# Patient Record
Sex: Female | Born: 1935 | Race: White | Hispanic: No | Smoking: Never smoker
Health system: Southern US, Community
[De-identification: ages and names within clinical notes are randomized; demographics above are authoritative.]

## PROBLEM LIST (undated history)

## (undated) DIAGNOSIS — E7800 Pure hypercholesterolemia, unspecified: Secondary | ICD-10-CM

## (undated) DIAGNOSIS — K56609 Unspecified intestinal obstruction, unspecified as to partial versus complete obstruction: Secondary | ICD-10-CM

## (undated) DIAGNOSIS — F32A Depression, unspecified: Secondary | ICD-10-CM

## (undated) DIAGNOSIS — E039 Hypothyroidism, unspecified: Secondary | ICD-10-CM

## (undated) DIAGNOSIS — N189 Chronic kidney disease, unspecified: Secondary | ICD-10-CM

## (undated) DIAGNOSIS — R51 Headache: Secondary | ICD-10-CM

## (undated) DIAGNOSIS — M797 Fibromyalgia: Secondary | ICD-10-CM

## (undated) DIAGNOSIS — I10 Essential (primary) hypertension: Secondary | ICD-10-CM

## (undated) DIAGNOSIS — G2581 Restless legs syndrome: Secondary | ICD-10-CM

## (undated) DIAGNOSIS — R001 Bradycardia, unspecified: Secondary | ICD-10-CM

## (undated) DIAGNOSIS — N3010 Interstitial cystitis (chronic) without hematuria: Secondary | ICD-10-CM

## (undated) DIAGNOSIS — F329 Major depressive disorder, single episode, unspecified: Secondary | ICD-10-CM

## (undated) DIAGNOSIS — J309 Allergic rhinitis, unspecified: Secondary | ICD-10-CM

## (undated) DIAGNOSIS — F419 Anxiety disorder, unspecified: Secondary | ICD-10-CM

## (undated) DIAGNOSIS — I639 Cerebral infarction, unspecified: Secondary | ICD-10-CM

## (undated) DIAGNOSIS — R1900 Intra-abdominal and pelvic swelling, mass and lump, unspecified site: Secondary | ICD-10-CM

## (undated) HISTORY — PX: APPENDECTOMY: SHX54

## (undated) HISTORY — PX: ABDOMINAL HYSTERECTOMY: SHX81

## (undated) HISTORY — PX: CYSTOSCOPY WITH URETHRAL DILATATION: SHX5125

## (undated) HISTORY — PX: CHOLECYSTECTOMY: SHX55

## (undated) HISTORY — PX: TONSILLECTOMY: SUR1361

---
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
## 1969-05-25 HISTORY — PX: SHOULDER SURGERY: SHX246

## 1969-05-25 HISTORY — PX: ABDOMINAL SURGERY: SHX537

## 1985-09-24 DIAGNOSIS — K56609 Unspecified intestinal obstruction, unspecified as to partial versus complete obstruction: Secondary | ICD-10-CM

## 1985-09-24 HISTORY — DX: Unspecified intestinal obstruction, unspecified as to partial versus complete obstruction: K56.609

## 1999-08-30 ENCOUNTER — Emergency Department (HOSPITAL_COMMUNITY): Admission: EM | Admit: 1999-08-30 | Discharge: 1999-08-30 | Payer: Self-pay | Admitting: Emergency Medicine

## 2002-06-24 DIAGNOSIS — I639 Cerebral infarction, unspecified: Secondary | ICD-10-CM

## 2002-06-24 HISTORY — DX: Cerebral infarction, unspecified: I63.9

## 2002-07-16 ENCOUNTER — Encounter: Payer: Self-pay | Admitting: Emergency Medicine

## 2002-07-16 ENCOUNTER — Inpatient Hospital Stay (HOSPITAL_COMMUNITY): Admission: EM | Admit: 2002-07-16 | Discharge: 2002-07-18 | Payer: Self-pay | Admitting: Emergency Medicine

## 2002-07-17 ENCOUNTER — Encounter: Payer: Self-pay | Admitting: Cardiology

## 2002-07-17 ENCOUNTER — Encounter: Payer: Self-pay | Admitting: *Deleted

## 2005-01-22 ENCOUNTER — Ambulatory Visit (HOSPITAL_COMMUNITY): Admission: RE | Admit: 2005-01-22 | Discharge: 2005-01-22 | Payer: Self-pay | Admitting: Urology

## 2005-07-25 ENCOUNTER — Encounter (INDEPENDENT_AMBULATORY_CARE_PROVIDER_SITE_OTHER): Payer: Self-pay | Admitting: Specialist

## 2005-07-25 ENCOUNTER — Ambulatory Visit (HOSPITAL_COMMUNITY): Admission: RE | Admit: 2005-07-25 | Discharge: 2005-07-25 | Payer: Self-pay | Admitting: Urology

## 2005-07-25 ENCOUNTER — Ambulatory Visit (HOSPITAL_BASED_OUTPATIENT_CLINIC_OR_DEPARTMENT_OTHER): Admission: RE | Admit: 2005-07-25 | Discharge: 2005-07-25 | Payer: Self-pay | Admitting: Urology

## 2006-03-06 ENCOUNTER — Ambulatory Visit (HOSPITAL_BASED_OUTPATIENT_CLINIC_OR_DEPARTMENT_OTHER): Admission: RE | Admit: 2006-03-06 | Discharge: 2006-03-06 | Payer: Self-pay | Admitting: Urology

## 2006-03-26 ENCOUNTER — Encounter: Admission: RE | Admit: 2006-03-26 | Discharge: 2006-03-26 | Payer: Self-pay | Admitting: Geriatric Medicine

## 2007-02-14 ENCOUNTER — Emergency Department (HOSPITAL_COMMUNITY): Admission: EM | Admit: 2007-02-14 | Discharge: 2007-02-14 | Payer: Self-pay | Admitting: Emergency Medicine

## 2007-06-17 ENCOUNTER — Encounter: Admission: RE | Admit: 2007-06-17 | Discharge: 2007-06-17 | Payer: Self-pay | Admitting: Geriatric Medicine

## 2008-01-27 ENCOUNTER — Encounter: Admission: RE | Admit: 2008-01-27 | Discharge: 2008-01-27 | Payer: Self-pay | Admitting: Geriatric Medicine

## 2008-04-04 ENCOUNTER — Ambulatory Visit: Payer: Self-pay | Admitting: Pulmonary Disease

## 2008-04-04 ENCOUNTER — Inpatient Hospital Stay (HOSPITAL_COMMUNITY): Admission: EM | Admit: 2008-04-04 | Discharge: 2008-04-09 | Payer: Self-pay | Admitting: Emergency Medicine

## 2008-04-05 ENCOUNTER — Encounter (INDEPENDENT_AMBULATORY_CARE_PROVIDER_SITE_OTHER): Payer: Self-pay | Admitting: Pulmonary Disease

## 2008-04-05 ENCOUNTER — Ambulatory Visit: Payer: Self-pay | Admitting: Vascular Surgery

## 2011-02-06 NOTE — Consult Note (Signed)
Leslie [name], Leslie [name]:  [id number]   MEDICAL RECORD NO.:  [id number]          PATIENT TYPE:  INP   LOCATION:  2007                         FACILITY:  [facility]   PHYSICIAN:  Lyn Records, M.D.   DATE OF BIRTH:  02-23-1922   DATE OF CONSULTATION:  04/08/2008  DATE OF DISCHARGE:                                 CONSULTATION   REASON FOR CONSULTATION:  Bradycardia.   CONCLUSIONS:  1. Bradycardia, possibly symptomatic.  Etiology currently could be      multifactorial.  Rule out sinus node dysfunction.  Rule out      medication effect (recent doses of clonidine, Cardizem, and      labetalol).  Rule out neurocardiogenic mechanisms.  2. Labile blood pressure elevation rule out renal artery stenosis,      rule out pheochromocytoma, rule out other.  3. History of anxiety and depression.  4. History of parkinsonism.  5. Interstitial cystitis.   RECOMMENDATIONS:  1. Withhold medications that can block the AV node or sinus node.  2. Rule out hypothyroidism (already done).  3. Ambulate to assess chronotropic response to physical activity.  4. No definite clinical indication for pacemaker therapy yet until we      are certain that medications are watched down on the patient's      system.  5. We will continue to follow with you.   COMMENTS:  The patient is 77 years of age and was admitted to the  hospital because of severe headache and elevated blood pressure.  After  admission to the hospital, it was noted that she tended to have  bradycardia.  Her initial heart rates were in the 50s.  Her home  medications included Diltiazem.  Because of elevated blood pressures,  she received labetalol and also several doses of clonidine.  Subsequently, the heart rates at times have noted to be in the upper  30s.  Most of these have occurred when the patient has been asleep.  She  had some very slow heart rates this morning.  She states that both this  morning and yesterday she had  nausea and sweating.  The nausea and  sweating this morning occurred between 8 and 9 o'clock before she ate  breakfast.  She did not vomit.  There has been no chest discomfort.  She  denies orthopnea.  She was having exertional dyspnea on admission.  She  has no history of coronary artery disease.   MEDICATIONS ON ADMISSION:  1. Lorazepam 1 mg p.o. b.i.d. p.r.n.  2. Trandolapril 1 mg p.o. daily.  3. Diltiazem CD 260 mg per day.  4. Levothyroxine 50 mcg per day.  5. Seroquel 50 mg daily.  6. Singulair 10 mg per day.  7. Simvastatin 10 mg per day.  8. Nexium 40 mg per day.  9. Elmiron 100 mg per day   ALLERGIES:  CODEINE, MORPHINE, PENICILLIN, ANTIHISTAMINES,  DECONGESTANTS, ALLEGRA, and TAGAMET.   SOCIAL HISTORY:  Does not smoke, does not drink.   FAMILY HISTORY:  Noncontributory.  Specifically, there is no history of  pacemaker therapy in parents  or siblings.   PHYSICAL EXAMINATION:  The patient is lying comfortably and flat in bed.  Her blood pressure is 140/73, drops to 87/86 with standing heart rate is  55.  HEENT exam unremarkable.  Neck exam reveals no bruits.  Chest is  clear.  Cardiac exam reveals a heart rate of around 50, S4 gallop.  Abdomen is soft.  Extremities no edema.  Neuro exam is unremarkable.   Laboratory data reveals a creatinine of 1.54, hemoglobin of 12.1.  TSH  1.766.  Echo reveals mild LVH, small LV cavity size, increased left  atrial size, EF of 60%.   DISCUSSION:  The patient has relatively nonspecific symptoms, admitting  significant blood pressure elevation, headache, and exertional dyspnea.  Since admission, she has been noted to be bradycardic.  My suspicion is  that the bradycardia is due to medications on top of some mild sinus  node dysfunction.  I do not yet see any indication for permanent  pacemaker therapy.  I would recommend withholding medications that  control the heart rate and monitoring the patient for period longer  before making a  definitive decision about pacemaker therapy.  We will  continue to follow with you.      Lyn Records, M.D.  Electronically Signed     HWS/MEDQ  D:  04/08/2008  T:  04/08/2008  Job:  [id number]   cc:   Hal T. Stoneking, M.D.

## 2011-02-06 NOTE — H&P (Signed)
[name], [name]:  [id number]   MEDICAL RECORD NO.:  [id number]          PATIENT TYPE:  INP   LOCATION:  1826                         FACILITY:  [facility]   PHYSICIAN:  Vanetta Mulders, MD         DATE OF BIRTH:  Dec 16, 1921   DATE OF ADMISSION:  04/04/2008  DATE OF DISCHARGE:                              HISTORY & PHYSICAL   CHIEF COMPLAINT:  The patient comes in for shortness of breath.   HISTORY OF PRESENT ILLNESS:  Leslie [name] is an 77 year old lady with past  medical history significant for hypertension, insomnia, restless leg  syndrome, depression, possible bipolar disorder, GERD, and interstitial  cystitis.  Recently, she has been evaluated by her primary care  physician for new onset hypertension.  Dr. Pete Glatter has discussed with  the family the possibility that the patient has secondary hypertension  and he was planning to do workup for renal artery stenosis.  He recently  escalated the doses of Diovan in the hope that the blood pressure is  going to be under better control.  For the past several  weeks, the  patient experienced on and off hypertension episodes where her blood  pressure was as high as 220/80.   Two days prior to admission, the patient started having shortness of  breath, sudden in onset that got worse over the next few days to the  point where she presented to the emergency room on the day of admission.  She denies at the interview, chest pain and she has not had this  recently.  She states that she has been having paroxysmal nocturnal  dyspnea and dyspnea with exertion, but again she denied chest pains.  She admitted having headaches, frontal significant, starting  approximately 2 days prior to admission.  The most intense headache the  patient had was an 8/10 and it was morning prior to the admission day.  The patient denies nausea, vomiting, lower extremity edema, calf pain,  or tenderness.  She states that she had been lightheaded,  but she denies  vertigo or neurologic problems.  She states that she has been feeling  week allover, but denies focal symptoms.   The patient also complaints of insomnia for which she has been taking  medications for a long time.  There is a history in the chart of  Parkinson disease.  The patient states that she was told that she does  not have these and she is on ReQuip for restless leg syndrome.   PAST MEDICAL HISTORY:  1. Hypertension.  2. Insomnia and restless leg syndrome.  3. History of depression and possible bipolar disorder per patient's      father.  4. GERD.  5. Anxiety.  6. Interstitial cystitis.  7. Chronic kidney disease per patient secondary to chronic      interstitial cystitis.  8. History of hysterectomy.  9. Multiple allergies.   SOCIAL HISTORY:  The patient has never smoked.  She denies drinking or  using illicit drugs.   FAMILY HISTORY:  Noncontributory.  The patient has an adopted daughter.  She  lives with her daughter.  She seems to be very compliant with her  medication.   REVIEW OF SYSTEMS:  Negative for chest pain, nausea or vomiting,  abdominal pain, diarrhea or constipation, urinary complaints, focal  weakness, rash, fever, chills, cough.  It is positive for facial  numbness that occurred the day prior to admission.  The patient denies  jaw claudication, denies arm pain or numbness, no back pain.  Rest  review of system per history of present illness.   PHYSICAL EXAM:  VITAL SIGNS:  Blood pressure at admission 181/69, heart  rate 60, respiratory rate 24, temperature 98.6, oxygen saturation 93% on  room air.  The patient received 10 mg of labetalol and blood pressure  dropped to 122/95, heart rate to 55, and oxygen saturation to 98.  GENERAL:  The patient is in no acute distress.  EYES:  PERRLA.  Extraocular movement intact.  OROPHARYNX:  Clear.  Good ear canal.  Normal lucency of tympanic  membrane.  CARDIOVASCULAR:  Bradycardia regular.  No  murmurs, no rubs.  RESPIRATORY:  Good air movement.  No adventitious breath sounds.  No  dullness to percussion.  ABDOMEN:  Bowel sounds positive, soft, nontender, and nondistended.  EXTREMITIES:  No edema.  NEUROLOGIC:  2 through 12 grossly intact.  Good sensation and good  reflexes.  PSYCHOLOGIC:  The patient is alert and oriented x3.   LABORATORY DATA AND IMAGES:  A UA showed 7-10 white blood cells and rare  squamous epithelial cells, negative nitrite and trace leukocyte  esterase.  BMP 66, sodium 137, potassium 5, chloride 101, bicarb 26,  glucose 107, BUN 20, creatinine 133 with GFR 46, calcium 9.  Point of  care markers negative with troponin less than 0.05 and myoglobin of 199,  white count 10.8, hemoglobin 12.5, and platelets 264.  CT head without  contrast done on 04/04/2008 showed no acute intracranial abnormalities,  extensive preventricular and subcortical white matter hyperattenuation  bilaterally.  Very nonspecific likely reflux sequelae of chronic  microvascular ischemia and chest x-ray done on April 04, 2008, showed  cardiomegaly with mild pulmonary vascular congestion and possibly tiny  left pleural effusion, elevation of the right hemidiaphragm, which is  persistent and present on previous chest x-ray.  Previous CT abdomen in  September 2008 show a regular dimentionated soft tissue mass seen in the  mesentery of the right lower quadrant at 2.8 x 3.2 cm, most consistent  with carcinoid tumor or lymphoma.   ASSESSMENT AND PLAN:  1. Shortness of breath.  Differential diagnoses at this time is very      broad.  The first set of cardiac enzymes point of care are      negative.  The EKG shows nonspecific ST changes with first-degree      AV block, which was present on the previous EKG on May 2008.  There      is no D-dimer in the computer.  The BMP is within normal limits and      patient does not have any signs on physical exam or chest x-ray of      pulmonary edema.  It  is unclear to me why the patient is short of      breath, it could be an anginal equivalent, it could be related to      something else like pulmonary embolism.  The patient does not have      any risk factors for pulmonary embolism.  No previous history of  clotting disorder.  No previous clots.  No history of traveling or      longtime immobilization.  No previous recent surgery or      hospitalization.  At this time, though, given the fact that the      shortness of breath was very sudden even though it could be      debilitating to the hypertensive urgency, I think it is reasonable      to check VQ scan and lower extremity Doppler's.  The shortness of      breath could be somewhat related to the elevated right diaphragm,      but this is an old problem previously seen on chest x-ray.  2. Hypertensive urgency.  The patient will be admitted to stepdown.      When seen in the emergency room, blood pressure was lower than      previously at 144/69.  At these time, I am going to hold the blood      pressure medications and consider starting nitroglycerine drip if      blood pressure is climbing up.  I will restart the PO blood      pressure medications in the a.m. holding the Diltiazem for      bradycardia and first-degree block.  The patient will need further      workup for secondary hypertension with possibly a renal Dopplex or      an MRI, which ever can be done here.  3. Headache most likely due to hypertension.  CT head was negative for      acute abnormalities.  The patient did not have any neurologic      abnormalities and the headache was improving with blood pressure      medications and blood pressure control.  We will continue to      monitor her problem.  4. Sinus bradycardia.  We will avoid beta-blockers and calcium channel      blockers.  We will consider starting nitroglycerine drip if the      blood pressure will significantly increase without medications.  5. DVT  prophylaxis with Lovenox.  6. Soft tissue mass in the right lower quadrant mesentery, and in the      past and in 2008, worrisome for carcinoid, it is questionable if      there were hypertensive urgency and it would be the cause for      hypertensive episodes.  7. Gastroesophageal reflux disease.  We will continue Nexium.  8. Ethics.  We discussed the code status and the patient states that      she does not want any aggressive care or therapy in case of      deterioration of her health status.  In case of cardiorespiratory      arrest, she does not want to receive CPR shock or intubation.  The      patient's brother was at the bedside for the discussion and she is      a power of attorney, but the patient also has a living will.      Vanetta Mulders, MD  Electronically Signed     DA/MEDQ  D:  04/05/2008  T:  04/05/2008  Job:  [id number]

## 2011-02-09 NOTE — Discharge Summary (Signed)
NAMETAMBER, BURTCH                         ACCOUNT NO.:  [id number]   MEDICAL RECORD NO.:  [id number]                   PATIENT TYPE:  INP   LOCATION:  3730                                 FACILITY:  [facility]   PHYSICIAN:  Candy Sledge, M.D.            DATE OF BIRTH:  1922-07-12   DATE OF ADMISSION:  07/16/2002  DATE OF DISCHARGE:  07/18/2002                                 DISCHARGE SUMMARY   HISTORY OF PRESENT ILLNESS:  For complete details of the chief complaint,  history of present illness, past medical history, and physical examination,  please refer to Dr. Porfirio Mylar Dohmeier's admission history and physical.  Briefly, the patient is a 77 year old, right-handed female who presented on  the afternoon of admission with the acute development of a expressive  aphasia, dysarthria, left-sided weakness, and tingling.  The tingling  sensation is primarily involved in her left arm and leg.  She had difficulty  walking at the onset of the symptomatology.  The initial examination in the  emergency room showed elevated blood pressure of 205/88.  She appeared to  have a lower left facial droop.  The left nasolabial fold was decreased.  The motor examination revealed good strength in the upper and lower  extremities with a very slight pronator drift on the left.  She had  bilateral dysmetria on finger-nose-finger testing.  The deep tendon reflexes  were generally diminished.  Sensation was basically intact bilaterally.  The  patient's deficits resolved after about two hours and the patient was  admitted for evaluation of probable right brain transient ischemic attacks.   LABORATORY DATA:  The admission CBC and differential showed a white cell  count of 10,300, hemoglobin 13.4, hematocrit 40.8, and platelet count  332,000.  There were 52% neutrophils, 33% lymphocytes, and 6% monocytes.  The PT and PTT were 12.9 and 32 seconds, respectively.  The comprehensive  metabolic panel showed all  indices to be within normal limits.  The fasting  lipid profile showed cholesterol 247 with an HDL of 35 and an LDL of 166.  The urinalysis is pending at this time.   The MRI of the brain showed a small acute right parietal nonhemorrhagic  infarct.  There were mild intercranial atherosclerotic changes on MRA and an  apparent 40% focal stenosis of the proximal right internal carotid artery.  Carotid Doppler studies showed no evidence of ICA stenosis.  Vertebral  artery flow was antegrade bilaterally.  A 2-D echocardiogram showed left  ventricular systolic function to be normal.  The ejection fraction was  estimated at 55-65%.  There were no left ventricular regional wall motion  abnormalities.  There was mild mitral annular calcification and mild mitral  valvular regurgitation.  The left atrium is mildly dilated.   HOSPITAL COURSE:  The patient was admitted to the telemetry unit.  No  arrhythmias were noted during this brief hospitalization.  She underwent the  above studies without problems.  Blood pressures were initially elevated in  the range of 180-200 systolic.  The patient was started on atenolol and  HCTZ.  The blood pressure on the day of discharge was 112/62.  The patient  remained essentially asymptomatic.  She was up ambulating without problems.  The neurologic examination at discharge was nonfocal.   The patient does have a presumed history of very mild Parkinson's disease  well managed on low-dose Requip.  There is no evidence of tremor, rigidity,  or bradykinesia on her examination.   DISPOSITION:  On July 18, 2002, she was felt to have arrived at maximum  hospital benefit and was discharged to home.   FINAL DIAGNOSES:  1. Right parietal nonhemorrhagic acute infarction.  2. Transient hypertension, controlled.  3. History of mild Parkinson's disease.  4. Chronic interstitial cystitis and nocturia.   DISCHARGE MEDICATIONS:  1. Requip 1 mg p.o. b.i.d.  2. Urised 2  mg two tablets three times a day.  3. Synthroid at home dose.  4. Fluoxetine at home dose.  5. Elmiron at home dose.  6. Ativan 1 mg at bedtime.  7. Restoril at home dose.  8. Allegra twice per day.  9. Motilium at home dose.  10.      Plavix 75 mg one per day.  The patient notes an allergy to aspirin,     which causes a rash.  11.      HCTZ 25 mg p.o. q.d.   FOLLOW-UP:  The patient was discharged to home with instructions to follow  up with Santina Evans A. Orlin Hilding, M.D., or Demetrio Lapping, P.A., in four  weeks.   CONDITION ON DISCHARGE:  Stable.   PROGNOSIS:  Good.                                               Candy Sledge, M.D.    JJS/MEDQ  D:  07/18/2002  T:  07/20/2002  Job:  [id number]   cc:   Santina Evans A. Orlin Hilding, M.D.  [address]  [place]  [place] [address]  Fax: [phone]

## 2011-02-09 NOTE — Op Note (Signed)
[name], PEYSER NO.:  [id number]   MEDICAL RECORD NO.:  [id number]          PATIENT TYPE:  AMB   LOCATION:  DAY                          FACILITY:  [facility]   PHYSICIAN:  Valetta Fuller, M.D.  DATE OF BIRTH:  Jul 22, 1922   DATE OF PROCEDURE:  01/22/2005  DATE OF DISCHARGE:                                 OPERATIVE REPORT   PREOPERATIVE DIAGNOSIS:  Interstitial cystitis.   POSTOPERATIVE DIAGNOSIS:  Interstitial cystitis.   PROCEDURE PERFORMED:  Urethral dilation, cystoscopy, with hydraulic  overdistention of the bladder and instillation of Clorpactin and Marcaine.   SURGEON:  Valetta Fuller, M.D.   ANESTHESIA:  General.   INDICATIONS:  Ms. Leslie [name] is an 77 year old female.  She has a longstanding  history of chronic pelvic pain with severe bladder overactivity.  She has  problems with chronic frequency, dysuria, and pelvic discomfort.  She has  also had recurrent bacterial cystitis in the past.  Approximately eight  years ago, the patient underwent cystoscopy with hydraulic overdistention of  the bladder which revealed a reduced bladder capacity and severe glomerular  hemorrhaging, consistent with interstitial cystitis.  The procedure markedly  helped her situation.  She has been on chronic maintenance with Urised and  Elmiron and has done really quite well over the last six to seven years.  Recently, she has had a flare-up of her pelvic pain, discomfort, and  dysuria.  Culture showed no evidence of infection, and we felt that she had  a significant flare-up of her interstitial cystitis.  She requested a repeat  procedure.   TECHNIQUE AND FINDINGS:  The patient was brought to the operating room,  where she had successful induction of general anesthesia.  She was placed in  the lithotomy position and prepped and draped in the usual manner.  She was  found to have severe atrophic vaginitis.  Urethral dilation was performed to  30 [place] without difficulty.   She underwent hydraulic overdistention of the  bladder at 80 cmH2O pressure for five minutes.  Capacity was reduced at  around 700-750 cc.  She had diffuse 4+ glomerular hemorrhaging and grossly  bloody effluent after the hydraulic overdistention.  She underwent a repeat  distention for 2 minutes.  Her bladder was then drained.  She had Clorpactin  instilled, a couple hundred cc at a time under gravity drainage.  Her  bladder was then emptied, and some Marcaine was applied.  A B & O  suppository was also administered.  The patient was brought to the recovery  room in stable condition.      DSG/MEDQ  D:  01/22/2005  T:  01/22/2005  Job:  [id number]

## 2011-02-09 NOTE — Op Note (Signed)
Leslie [name], TUFT NO.:  [id number]   MEDICAL RECORD NO.:  [id number]          PATIENT TYPE:  AMB   LOCATION:  [facility]                         FACILITY:  [facility]   PHYSICIAN:  Valetta Fuller, M.D.  DATE OF BIRTH:  02-May-1922   DATE OF PROCEDURE:  07/25/2005  DATE OF DISCHARGE:                                 OPERATIVE REPORT   PREOPERATIVE DIAGNOSES:  1.  Chronic interstitial cystitis.  2.  Chronic bacterial cystitis.  3.  Chronic pelvic pain.   POSTOPERATIVE DIAGNOSES:  1.  Chronic interstitial cystitis.  2.  Chronic bacterial cystitis.  3.  Chronic pelvic pain.   PROCEDURE:  Cystoscopy with bladder biopsy x3 and fulguration and  installation of Pyridium and Marcaine.   SURGEON:  Valetta Fuller, M.D.   ANESTHESIA:  General.   INDICATIONS:  Ms. Leslie [name] is an 77 year old female. She has had a complex  urologic history. She has clearly had longstanding chronic pelvic pain with  severe irritative voiding symptoms. We initially assessed her maybe 7 to 9  years ago at which point we felt she had interstitial cystitis. She was  relatively well managed on Uracid and Elmiron. Approximately 6 months ago  she began having increasing symptoms. In May of this year, we did a repeat  hydraulic over distension of her bladder which showed reduced capacity and  really severe glomerular hemorrhaging. She did get some improvement but it  was not terribly durable. The patient has also had what is felt to be  chronic recurrent bacterial cystitis. She has had several recent urinalyses  which have shown too numerous to count white blood cells as well as bacteria  but interestingly the cultures have been negative. Because of this ongoing  inflammatory picture, we felt it would probably be prudent to go in and take  some biopsies to be sure we are not dealing with other pathology. She  presents now for reassessment of her bladder.   TECHNIQUE AND FINDINGS:  The patient was  brought to the operating room where  she had successful induction of general anesthesia. She was placed in  lithotomy position, prepped and draped in the usual manner. The patient's  initial cystoscopy showed just some very significant diffuse chronic  erythema and what appeared to be mucosal edema. The edema was especially  pronounced close to the right ureteral orifice. She had several areas where  there did seem to be spontaneous bleeding from friable vessels and fairly  abnormal looking mucosa. I went ahead and did two cold cup biopsies of the  right hemitrigone where there was maximum inflammation and edema. We also  took an additional biopsy too of the right lateral wall and another area  where there was considerable mucosal abnormality. These areas were then  copiously fulgurated. Numerous others areas her bladder seemed to have  spontaneous bleeding and some of these other areas were fulgurated for  hemostasis as well. The picture overall in her bladder was quite nonspecific  but was consistent with a severe chronic inflammatory picture. I did not see  anything to definitively look like  transitional cell carcinoma but certainly  an infiltrative inflammatory type of malignancy and/or  carcinoma in situ cannot be ruled out and hence the biopsies were indeed  done. At the completion of the procedure, I instilled some Marcaine and  Pyridium in her bladder to help with postop analgesia. She was brought to  the recovery room in stable condition.           ______________________________  Valetta Fuller, M.D.  Electronically Signed     DSG/MEDQ  D:  07/25/2005  T:  07/25/2005  Job:  [id number]

## 2011-02-09 NOTE — Discharge Summary (Signed)
[name], KIMMET NO.:  [id number]   MEDICAL RECORD NO.:  [id number]          PATIENT TYPE:  INP   LOCATION:  2007                         FACILITY:  [facility]   PHYSICIAN:  Corinna L. Lendell [name], [name]:  04-11-1922   DATE OF ADMISSION:  04/04/2008  DATE OF DISCHARGE:  04/09/2008                               DISCHARGE SUMMARY   DISCHARGE DIAGNOSES:  1. Malignant hypertension.  2. Episodes of dyspnea and dizziness.  3. Abdominal mass, previously followed with serial CAT scans and      evaluated by surgery, felt to be benign.  4. History of asthma.  5. Bradycardia.  6. Renal insufficiency.  7. History of anxiety and depression.  8. Hypothyroidism.  9. History of interstitial cystitis.  10.History of depressive disorder.  11.Restless leg syndrome.  12.Gastroesophageal reflux disease.   DISCHARGE MEDICATIONS:  Continue.  She was told to stop diltiazem, stop  Diovan HCT, instead start Diovan 320 mg a day, Norvasc 5 mg a day,  continue lorazepam 1 mg p.o. b.i.d. as needed, ropinirole 1 mg p.o.  b.i.d., Synthroid 50 mcg a day, Seroquel 50 mg nightly, Singulair 10 mg  nightly, simvastatin 10 mg nightly, Nexium 40 mg a day, and iron t.i.d.   CONDITION:  Stable.   Follow up with Dr. Pete Glatter, at which time, urine, metanephrines, and 5-  HIAA should be checked, also blood pressure and heart rate.  Follow up  with Dr. Mendel Ryder on April 28, 2008, at 10:30 a.m.  A Holter monitor is  being arranged.   DIET:  Low salt.   ACTIVITY:  Ad lib.  Code status is limited.  Antiarrhythmic and  vasoactive drugs only.  No defibrillation, intubation, CPR, or  mechanical ventilation.   CONSULTATIONS:  Lyn Records, M.D.   PROCEDURES:  None.   LABS:  CBC unremarkable.  PT/PTT, D-dimer unremarkable.  Basic metabolic  panel on admission significant for a creatinine of 1.33.  Liver function  tests, magnesium normal.  Phosphorus 5.1.  Serial cardiac enzymes  normal.   TSH 1.766, free T4 0.91.  Urine catecholamines pending.  Urine  metanephrines and 5-HIAA pending.  Urinalysis showed negative nitrite,  trace leukocyte esterase, and 7-10 white cells.  Urine culture grew out  insignificant growth.   SPECIAL STUDIES/RADIOLOGY:  EKG showed sinus bradycardia with a rate in  the 50s.  Nonspecific changes.  MRA of the abdomen showed probable  cystic lesion involving the uncinate process of the pancreas.  Nondiagnostic study for renal arteries, markedly limited study due to  respiratory motion.  MRI, official report is pending.  MRA inadequate  for evaluation of renal arteries.  Chest x-ray showed cardiomegaly,  possible tiny left pleural effusion.  CT brain showed nothing acute,  extensive periventricular and subcortical white matter hypoattenuation.  Official MRI report is pending.  Echocardiogram showed normal ejection  fraction, mildly dilated left atrium.  Lower extremity Dopplers negative  for DVT.   HISTORY AND HOSPITAL COURSE:  [name] is an 77 year old white female  who presented with an episode of dyspnea and dizziness.  She was found  to have a blood pressure initially of 181/69 and a heart rate of 55.  She had a negative D-dimer and negative cardiac enzymes, but was found  to have periods of bradycardia.  She had been on Cardizem as an  outpatient and apparently her blood pressure had recently been very  difficult to control and her medications had been increased as an  outpatient.  Her Cardizem was stopped and she was started on Norvasc.  Her creatinine was increased on admission and increased slightly during  the hospitalization.  Her hydrochlorothiazide should be stopped, but she  may resume her ARB.  She has a history of an abdominal mass which was  felt to be benign, but given her symptoms, she was worked up for  carcinoid and the urine for 5-HIAA is pending, also urine was sent for  metanephrines and catecholamines.  An MRA of the renal  arteries was  ordered, but apparently was a poor study and renal arteries could not be  evaluated.  If need be, she could have a captopril nuclear medicine  study as an outpatient.  She continued to have bradycardia despite being  off Cardizem for several days.  She continued to have periods of  dizziness, but they did not necessarily correlate with bradycardia.  Cardiology was consulted and felt that she probably did have sinus node  dysfunction and would most likely need a pacemaker, but recommended  ambulatory monitoring to better correlate symptoms and rate.  The  patient's blood pressure by the time of discharge was 130/60.  She will  need followup with Dr. Pete Glatter for the labs and any further workup of  her hypertension.  Her dizziness and shortness of breath did seem to  improve off the Cardizem.   Also the official MRI report is pending.      Corinna L. Lendell [name], MD  Electronically Signed     CLS/MEDQ  D:  05/05/2008  T:  05/06/2008  Job:  [id number]   cc:   Hal T. Stoneking, M.D.  Lyn Records, M.D.

## 2011-02-09 NOTE — Op Note (Signed)
NAMEKRYSTIE, LEITER NO.:  [id number]   MEDICAL RECORD NO.:  [id number]          PATIENT TYPE:  AMB   LOCATION:  [facility]                         FACILITY:  [facility]   PHYSICIAN:  Valetta Fuller, M.D.  DATE OF BIRTH:  04-19-1922   DATE OF PROCEDURE:  03/06/2006  DATE OF DISCHARGE:                                 OPERATIVE REPORT   PREOPERATIVE DIAGNOSIS:  Interstitial cystitis.   POSTOPERATIVE DIAGNOSIS:  Interstitial cystitis.   PROCEDURE PERFORMED:  Urethral dilation, cystoscopy with hydraulic over  distension of the bladder, and installation of Marcaine   INDICATIONS:  Ms. Dehaas is an 77 year old female.  She is had very long-  standing problems with chronic pelvic pain and significant bladder  overactivity.  She has undergone cystoscopic assessment with hydraulic over  distension of the bladder in the past.  She has been noted to have a  markedly reduced bladder capacity.  She has had endoscopic evidence of  severe interstitial cystitis and has had some improvement with hydraulic  over distensions in the past.  The patient has also had ongoing chronic  pyuria.  Her cultures have tended to be negative, but she has had evidence  of severe chronic urinary tract inflammation.  Previous assessment had  revealed fairly normal capacity of 1000 to 1300 mL when she had her first  hydraulic over distension approximately ten years ago.  More recent  hydraulic over distension of her bladder was done back in November 2006.  At  that time, she also had biopsies because of the persistently abnormal urine  with negative cultures and these biopsies all revealed severe chronic and  acute inflammation including significant numbers of mass cells.  The  patient's symptoms have worsened recently and she requested repeat hydraulic  over distension of her bladder for reassessment and to see if we could  improve her symptom complex.   PROCEDURE TECHNIQUE AND FINDINGS:  The patient was  brought to the operating  room.  She had received preoperative gentamycin.  She was placed in the  lithotomy position and prepped and draped in the usual manner.  Urethral  dilation was performed to 26-French and there was no evidence of significant  stenosis.  Cystoscopy revealed fairly significant diffuse chronic  inflammatory changes of her bladder.  Hydraulic over distension of the  bladder was done to 80-100 cmH2O pressure for five minutes.  The capacity  was markedly reduced at only 450 mL.  The patient had diffuse severe  glomerular hemorrhaging with really a grossly bloody affluent of urine after  the hydraulic over distension.  She underwent repeat hydraulic over  distension for two additional minutes and then had installation of Marcaine  in her bladder.  She appeared to tolerate the procedure well and was brought  to the recovery room in stable condition.           ______________________________  Valetta Fuller, M.D.  Electronically Signed     DSG/MEDQ  D:  03/06/2006  T:  03/06/2006  Job:  [id number]

## 2011-02-09 NOTE — Consult Note (Signed)
NAME:  Leslie [name], Leslie [name]                         ACCOUNT NO.:  [id number]   MEDICAL RECORD NO.:  [id number]                   PATIENT TYPE:  EMS   LOCATION:  [facility]                                 FACILITY:  [facility]   PHYSICIAN:  Melvyn Novas, M.D.               DATE OF BIRTH:  1922-04-13   DATE OF CONSULTATION:  DATE OF DISCHARGE:                                   CONSULTATION   This 77 year old pleasant Caucasian right-handed female is a patient of Dr.  Marcelino Freestone.  She presented in the company of her grandson at about 3  p.m. today with an acute development of expressive aphasia, dysarthria, and  left-sided weakness, still able to communicate to him that she needs to go  to the hospital.  She states that she felt not well for 3 weeks prior due to  a viral illness that had given her chest congestion, frequent coughs,  inability to sleep, and she had just received a series of Rocephin shots  through her family doctor, Dr. Merril Abbe.  She had no other recent changes  in the medication list.   PAST MEDICAL HISTORY:  Positive for chronic interstitial cystitis and  nocturia, Parkinson's disease without dementia, bronchitis, anxiety,  insomnia from seasonal allergies, and chronic nausea.  The patient denies  any cardiac history, has never used aspirin or Plavix.   MEDICATIONS:  1. Requip 1 mg t.i.d.  2. Urocit 2 mg 2 tablets t.i.d.  3. Elmiron 5 mg t.i.d. p.o.  4. Restoril 5 mg q.h.s.  5. Ativan 1 mg q.h.s.  6. Allegra 1 pill in the morning.  7. Motilium.  The patient stated that this is a medication she receives     from [place] and would like to take at the bedside.  This is a non-     dopamine-antagonistic antinausea medication.   ALLERGIES:  SHE HAS NO KNOWN DRUG ALLERGIES EXCEPT A RASH TO ASPIRIN.   SOCIAL HISTORY:  The patient is a widow, lives alone, but has good contact  to her daughter and grandchildren who live close.  The patient is living in  assisted living and  is socially very active, participates.  She no longer  drives.  She does not smoke and does not use alcohol.   FAMILY HISTORY:  Negative for heart disease, coronary artery disease, or  hypertension, no diabetes, no strokes.   PHYSICAL EXAMINATION:  VITAL SIGNS:  The patient was admitted with 240/80  systolic blood pressure.  Currently, she is around 205/88.  Heart rate is in  the 80s.  Respiratory rate is 14.  Temperature is 97.  LUNGS:  Clear to auscultation.  ABDOMEN:  Soft, nontender, nondistended.  No flank pain.  EXTREMITIES:  Extremity pulses are all present and palpable.  There is no  peripheral edema, but a slight rash of 4-mm- to 6-mm-diameter spots strewn  over the extensor side of the extremities, tipped  by a small vesicle or  crust.  They appear slightly raised and pinkish.  She stated that these just  occurred over the last 3 days.  HEENT:  Mucous membranes are all intact without inflammation or infection  changes.  HEART:  No cardiac murmur.  MENTAL STATUS:  Alert and oriented x3.  Dysarthric but no longer aphasic as  initially described by her and her grandson.  CRANIAL NERVES:  Pupils are equal and reactive to light and accommodation.  Extraocular movements intact.  No gaze preference.  No papilledema.  No  facial tenderness.  There is lower left facial droop in the form of a  central 7th.  Frontal muscles are intact.  Eye closure is slightly  asymmetric.  The patient states she has an old ptosis.  Tongue and uvula are  midline.  The left nasolabial fold is decreased.  MOTOR EXAM:  Includes good strength left and right and equal pinch strength.  Muscle tone is equal and resistant, and a slight general stiffness, which  also hampered some rapid alternating movements.  Muscle mass is equal.  The  patient has a slight pronator drift on the left.  Finger-to-nose shows  bilateral dysmetria, but no ataxia and no tremor.  GAIT AND STANCE:  The patient can sit up with some  assistance due to truncal  stiffness but not truly weakness.  She can move her lower extremities when  sitting at the bedside and extend her knees.  She shows fairly equal  strength in knee extension, knee flexion, dorsiflexion, plantar flexion, and  toe movement.  She has no Babinski.  The DTRs are only traces.  There is no  clonus.  SENSORY:  Intact to pinprick, vibration, touch, and temperature.   ASSESSMENT:  The patient will be admitted for a motor stroke with  brachiofacial distribution.  This is perhaps a transient ischemic attack and  she could recover within 24 hours.  Due to her high blood pressure and her  low degree of impairment, she is not considered a t-PA candidate.  She might  have had suffered a vasospasm caused by the hypertensive episodes.   PLAN:  Admit for telemetry observation, hypertension treatment.  We will try  to keep the blood pressure around 150 systolic.  The patient can continue  her Parkinson's meds and all other medications.  We will add Plavix and  Toprol.  The patient is on a regular diet.  A TEE, carotid Doppler, and  speech, as well as OT consult and rehab consult, are established.                                               Melvyn Novas, M.D.    CD/MEDQ  D:  07/16/2002  T:  07/17/2002  Job:  [id number]

## 2011-04-17 ENCOUNTER — Other Ambulatory Visit: Payer: Self-pay | Admitting: Surgery

## 2011-06-21 LAB — BASIC METABOLIC PANEL
BUN: 20
CO2: 26
Calcium: 9
Creatinine, Ser: 1.33 — ABNORMAL HIGH
GFR calc non Af Amer: 38 — ABNORMAL LOW
Glucose, Bld: 107 — ABNORMAL HIGH
Sodium: 137

## 2011-06-21 LAB — URINALYSIS, ROUTINE W REFLEX MICROSCOPIC
Bilirubin Urine: NEGATIVE
Hgb urine dipstick: NEGATIVE
Ketones, ur: NEGATIVE
Nitrite: NEGATIVE
Specific Gravity, Urine: 1.012
Urobilinogen, UA: 0.2

## 2011-06-21 LAB — CATECHOLAMINES, FRACTIONATED, URINE, 24 HOUR
Dopamine 24 Hr Urine: 79 ug/24hr (ref <500)
Total urine volume: 550 mL

## 2011-06-21 LAB — URINE MICROSCOPIC-ADD ON

## 2011-06-21 LAB — COMPREHENSIVE METABOLIC PANEL
Alkaline Phosphatase: 66
BUN: 18
Chloride: 101
Creatinine, Ser: 1.32 — ABNORMAL HIGH
Glucose, Bld: 105 — ABNORMAL HIGH
Potassium: 4.3
Total Bilirubin: 0.7

## 2011-06-21 LAB — URINE CULTURE: Colony Count: 5000

## 2011-06-21 LAB — DIFFERENTIAL
Basophils Absolute: 0
Basophils Relative: 0
Lymphocytes Relative: 15
Monocytes Absolute: 0.9
Neutro Abs: 7.2
Neutrophils Relative %: 67

## 2011-06-21 LAB — POCT CARDIAC MARKERS
CKMB, poc: 3.9
Operator id: 196461
Troponin i, poc: <0.05

## 2011-06-21 LAB — CBC
MCHC: 33
Platelets: 264
RDW: 15.2

## 2011-06-21 LAB — T4, FREE: Free T4: 0.91

## 2011-06-21 LAB — MAGNESIUM: Magnesium: 2.2

## 2011-06-21 LAB — CARDIAC PANEL(CRET KIN+CKTOT+MB+TROPI)
Relative Index: INVALID
Troponin I: <0.01

## 2011-06-21 LAB — PROTIME-INR: Prothrombin Time: 13.8

## 2011-06-21 LAB — D-DIMER, QUANTITATIVE: D-Dimer, Quant: <0.22

## 2011-06-21 LAB — CK TOTAL AND CKMB (NOT AT ARMC): Total CK: 83

## 2011-06-22 LAB — CBC
Hemoglobin: 12.1
MCHC: 33.3
MCV: 87.7
RBC: 4.15

## 2011-06-22 LAB — BASIC METABOLIC PANEL
CO2: 28
Chloride: 98
GFR calc Af Amer: 39 — ABNORMAL LOW
Sodium: 136

## 2011-07-05 ENCOUNTER — Emergency Department (HOSPITAL_COMMUNITY): Payer: Medicare Other

## 2011-07-05 ENCOUNTER — Emergency Department (HOSPITAL_COMMUNITY)
Admission: EM | Admit: 2011-07-05 | Discharge: 2011-07-05 | Disposition: A | Discharge status: Home / Self Care | Payer: Medicare Other | Attending: Emergency Medicine | Admitting: Emergency Medicine

## 2011-07-05 DIAGNOSIS — G8929 Other chronic pain: Secondary | ICD-10-CM | POA: Insufficient documentation

## 2011-07-05 DIAGNOSIS — E789 Disorder of lipoprotein metabolism, unspecified: Secondary | ICD-10-CM | POA: Insufficient documentation

## 2011-07-05 DIAGNOSIS — N390 Urinary tract infection, site not specified: Secondary | ICD-10-CM | POA: Insufficient documentation

## 2011-07-05 DIAGNOSIS — Z79899 Other long term (current) drug therapy: Secondary | ICD-10-CM | POA: Insufficient documentation

## 2011-07-05 DIAGNOSIS — R110 Nausea: Secondary | ICD-10-CM | POA: Insufficient documentation

## 2011-07-05 DIAGNOSIS — E875 Hyperkalemia: Secondary | ICD-10-CM | POA: Insufficient documentation

## 2011-07-05 DIAGNOSIS — R197 Diarrhea, unspecified: Secondary | ICD-10-CM | POA: Insufficient documentation

## 2011-07-05 DIAGNOSIS — R109 Unspecified abdominal pain: Secondary | ICD-10-CM | POA: Insufficient documentation

## 2011-07-05 DIAGNOSIS — I10 Essential (primary) hypertension: Secondary | ICD-10-CM | POA: Insufficient documentation

## 2011-07-05 DIAGNOSIS — Z8673 Personal history of transient ischemic attack (TIA), and cerebral infarction without residual deficits: Secondary | ICD-10-CM | POA: Insufficient documentation

## 2011-07-05 DIAGNOSIS — N949 Unspecified condition associated with female genital organs and menstrual cycle: Secondary | ICD-10-CM | POA: Insufficient documentation

## 2011-07-05 DIAGNOSIS — G2581 Restless legs syndrome: Secondary | ICD-10-CM | POA: Insufficient documentation

## 2011-07-05 LAB — DIFFERENTIAL
Basophils Absolute: 0.1 10*3/uL (ref 0.0–0.1)
Eosinophils Absolute: 1.2 10*3/uL — ABNORMAL HIGH (ref 0.0–0.7)
Lymphocytes Relative: 19 % (ref 12–46)
Lymphs Abs: 1.9 10*3/uL (ref 0.7–4.0)
Neutrophils Relative %: 60 % (ref 43–77)

## 2011-07-05 LAB — COMPREHENSIVE METABOLIC PANEL
ALT: 18 U/L (ref 0–35)
AST: 27 U/L (ref 0–37)
Alkaline Phosphatase: 66 U/L (ref 39–117)
CO2: 26 mEq/L (ref 19–32)
Chloride: 106 mEq/L (ref 96–112)
Creatinine, Ser: 1.29 mg/dL — ABNORMAL HIGH (ref 0.50–1.10)
GFR calc non Af Amer: 36 mL/min — ABNORMAL LOW (ref >90)
Potassium: 5.8 mEq/L — ABNORMAL HIGH (ref 3.5–5.1)
Sodium: 141 mEq/L (ref 135–145)
Total Bilirubin: 0.3 mg/dL (ref 0.3–1.2)

## 2011-07-05 LAB — URINALYSIS, ROUTINE W REFLEX MICROSCOPIC
Glucose, UA: NEGATIVE mg/dL
Ketones, ur: NEGATIVE mg/dL
Protein, ur: 100 mg/dL — AB

## 2011-07-05 LAB — CBC
MCV: 90.9 fL (ref 78.0–100.0)
Platelets: 263 10*3/uL (ref 150–400)
RBC: 4.38 MIL/uL (ref 3.87–5.11)
WBC: 9.6 10*3/uL (ref 4.0–10.5)

## 2011-07-05 LAB — URINE MICROSCOPIC-ADD ON

## 2011-07-06 LAB — CLOSTRIDIUM DIFFICILE BY PCR: Toxigenic C. Difficile by PCR: NEGATIVE

## 2011-07-09 LAB — STOOL CULTURE

## 2011-09-26 ENCOUNTER — Emergency Department (HOSPITAL_COMMUNITY): Payer: Medicare Other

## 2011-09-26 ENCOUNTER — Encounter: Payer: Self-pay | Admitting: *Deleted

## 2011-09-26 ENCOUNTER — Other Ambulatory Visit: Payer: Self-pay

## 2011-09-26 ENCOUNTER — Inpatient Hospital Stay (HOSPITAL_COMMUNITY)
Admission: EM | Admit: 2011-09-26 | Discharge: 2011-10-02 | DRG: 153 | Disposition: A | Discharge status: Skilled Nursing Facility | Payer: Medicare Other | Attending: Internal Medicine | Admitting: Internal Medicine

## 2011-09-26 DIAGNOSIS — N183 Chronic kidney disease, stage 3 unspecified: Secondary | ICD-10-CM | POA: Diagnosis present

## 2011-09-26 DIAGNOSIS — R1110 Vomiting, unspecified: Secondary | ICD-10-CM

## 2011-09-26 DIAGNOSIS — J111 Influenza due to unidentified influenza virus with other respiratory manifestations: Principal | ICD-10-CM | POA: Diagnosis present

## 2011-09-26 DIAGNOSIS — R1900 Intra-abdominal and pelvic swelling, mass and lump, unspecified site: Secondary | ICD-10-CM | POA: Diagnosis present

## 2011-09-26 DIAGNOSIS — Z882 Allergy status to sulfonamides status: Secondary | ICD-10-CM

## 2011-09-26 DIAGNOSIS — I129 Hypertensive chronic kidney disease with stage 1 through stage 4 chronic kidney disease, or unspecified chronic kidney disease: Secondary | ICD-10-CM | POA: Diagnosis present

## 2011-09-26 DIAGNOSIS — N179 Acute kidney failure, unspecified: Secondary | ICD-10-CM | POA: Diagnosis present

## 2011-09-26 DIAGNOSIS — Z880 Allergy status to penicillin: Secondary | ICD-10-CM

## 2011-09-26 DIAGNOSIS — I10 Essential (primary) hypertension: Secondary | ICD-10-CM | POA: Diagnosis present

## 2011-09-26 DIAGNOSIS — K56609 Unspecified intestinal obstruction, unspecified as to partial versus complete obstruction: Secondary | ICD-10-CM | POA: Diagnosis present

## 2011-09-26 DIAGNOSIS — R509 Fever, unspecified: Secondary | ICD-10-CM | POA: Diagnosis present

## 2011-09-26 DIAGNOSIS — R0602 Shortness of breath: Secondary | ICD-10-CM | POA: Diagnosis present

## 2011-09-26 DIAGNOSIS — K529 Noninfective gastroenteritis and colitis, unspecified: Secondary | ICD-10-CM | POA: Diagnosis present

## 2011-09-26 DIAGNOSIS — E039 Hypothyroidism, unspecified: Secondary | ICD-10-CM | POA: Diagnosis present

## 2011-09-26 DIAGNOSIS — Z888 Allergy status to other drugs, medicaments and biological substances status: Secondary | ICD-10-CM

## 2011-09-26 DIAGNOSIS — K5289 Other specified noninfective gastroenteritis and colitis: Secondary | ICD-10-CM | POA: Diagnosis present

## 2011-09-26 DIAGNOSIS — R197 Diarrhea, unspecified: Secondary | ICD-10-CM | POA: Diagnosis present

## 2011-09-26 DIAGNOSIS — R531 Weakness: Secondary | ICD-10-CM | POA: Diagnosis present

## 2011-09-26 DIAGNOSIS — R0902 Hypoxemia: Secondary | ICD-10-CM | POA: Diagnosis present

## 2011-09-26 HISTORY — DX: Bradycardia, unspecified: R00.1

## 2011-09-26 HISTORY — DX: Headache: R51

## 2011-09-26 HISTORY — DX: Pure hypercholesterolemia, unspecified: E78.00

## 2011-09-26 HISTORY — DX: Depression, unspecified: F32.A

## 2011-09-26 HISTORY — DX: Cerebral infarction, unspecified: I63.9

## 2011-09-26 HISTORY — DX: Major depressive disorder, single episode, unspecified: F32.9

## 2011-09-26 HISTORY — DX: Allergic rhinitis, unspecified: J30.9

## 2011-09-26 HISTORY — DX: Interstitial cystitis (chronic) without hematuria: N30.10

## 2011-09-26 HISTORY — DX: Intra-abdominal and pelvic swelling, mass and lump, unspecified site: R19.00

## 2011-09-26 HISTORY — DX: Restless legs syndrome: G25.81

## 2011-09-26 HISTORY — DX: Fibromyalgia: M79.7

## 2011-09-26 HISTORY — DX: Unspecified intestinal obstruction, unspecified as to partial versus complete obstruction: K56.609

## 2011-09-26 HISTORY — DX: Anxiety disorder, unspecified: F41.9

## 2011-09-26 HISTORY — DX: Essential (primary) hypertension: I10

## 2011-09-26 HISTORY — DX: Hypothyroidism, unspecified: E03.9

## 2011-09-26 LAB — COMPREHENSIVE METABOLIC PANEL
AST: 124 U/L — ABNORMAL HIGH (ref 0–37)
Albumin: 4.2 g/dL (ref 3.5–5.2)
Alkaline Phosphatase: 65 U/L (ref 39–117)
BUN: 29 mg/dL — ABNORMAL HIGH (ref 6–23)
Chloride: 97 mEq/L (ref 96–112)
Potassium: 4.2 mEq/L (ref 3.5–5.1)
Total Bilirubin: 0.4 mg/dL (ref 0.3–1.2)
Total Protein: 8.9 g/dL — ABNORMAL HIGH (ref 6.0–8.3)

## 2011-09-26 LAB — URINALYSIS, ROUTINE W REFLEX MICROSCOPIC
Bilirubin Urine: NEGATIVE
Leukocytes, UA: NEGATIVE
Nitrite: NEGATIVE
Specific Gravity, Urine: 1.02 (ref 1.005–1.030)
Urobilinogen, UA: 0.2 mg/dL (ref 0.0–1.0)
pH: 5.5 (ref 5.0–8.0)

## 2011-09-26 LAB — PROTIME-INR
INR: 1.15 (ref 0.00–1.49)
Prothrombin Time: 14.9 seconds (ref 11.6–15.2)

## 2011-09-26 LAB — CBC
MCV: 89.6 fL (ref 78.0–100.0)
Platelets: ADEQUATE 10*3/uL (ref 150–400)
RBC: 5.49 MIL/uL — ABNORMAL HIGH (ref 3.87–5.11)
RDW: 14.4 % (ref 11.5–15.5)
WBC: 10.9 10*3/uL — ABNORMAL HIGH (ref 4.0–10.5)

## 2011-09-26 LAB — INFLUENZA PANEL BY PCR (TYPE A & B)
H1N1 flu by pcr: NOT DETECTED
Influenza A By PCR: POSITIVE — AB
Influenza B By PCR: NEGATIVE

## 2011-09-26 LAB — URINE MICROSCOPIC-ADD ON

## 2011-09-26 LAB — LIPASE, BLOOD: Lipase: 42 U/L (ref 11–59)

## 2011-09-26 LAB — GLUCOSE, CAPILLARY: Glucose-Capillary: 103 mg/dL — ABNORMAL HIGH (ref 70–99)

## 2011-09-26 LAB — LACTIC ACID, PLASMA: Lactic Acid, Venous: 0.9 mmol/L (ref 0.5–2.2)

## 2011-09-26 LAB — DIFFERENTIAL
Basophils Absolute: 0 10*3/uL (ref 0.0–0.1)
Eosinophils Relative: 0 % (ref 0–5)
Lymphocytes Relative: 16 % (ref 12–46)
Monocytes Relative: 8 % (ref 3–12)
Neutrophils Relative %: 76 % (ref 43–77)
Smear Review: DECREASED

## 2011-09-26 LAB — TSH: TSH: 2.433 u[IU]/mL (ref 0.350–4.500)

## 2011-09-26 MED ORDER — SODIUM CHLORIDE 0.9 % IV SOLN
500.0000 mg | INTRAVENOUS | Status: AC
  Administered 2011-09-26: 500 mg via INTRAVENOUS
  Filled 2011-09-26: qty 0.5

## 2011-09-26 MED ORDER — DOXYCYCLINE HYCLATE 100 MG IV SOLR
100.0000 mg | INTRAVENOUS | Status: AC
  Administered 2011-09-26: 100 mg via INTRAVENOUS
  Filled 2011-09-26: qty 100

## 2011-09-26 MED ORDER — HYDROCODONE-ACETAMINOPHEN 5-325 MG PO TABS
1.0000 | ORAL_TABLET | ORAL | Status: DC | PRN
  Administered 2011-09-27 – 2011-10-01 (×3): 1 via ORAL
  Filled 2011-09-26 (×3): qty 1

## 2011-09-26 MED ORDER — CLONIDINE HCL 0.1 MG PO TABS
0.1000 mg | ORAL_TABLET | Freq: Two times a day (BID) | ORAL | Status: DC
  Administered 2011-09-26 – 2011-09-28 (×3): 0.1 mg via ORAL
  Filled 2011-09-26 (×5): qty 1

## 2011-09-26 MED ORDER — ROPINIROLE HCL 1 MG PO TABS
1.0000 mg | ORAL_TABLET | Freq: Two times a day (BID) | ORAL | Status: DC
  Administered 2011-09-26 – 2011-09-27 (×2): 2 mg via ORAL
  Administered 2011-09-27 – 2011-09-28 (×2): 1 mg via ORAL
  Administered 2011-09-28: 2 mg via ORAL
  Administered 2011-09-29: 1 mg via ORAL
  Administered 2011-09-29: 2 mg via ORAL
  Administered 2011-09-30 – 2011-10-02 (×5): 1 mg via ORAL
  Filled 2011-09-26 (×13): qty 2

## 2011-09-26 MED ORDER — LORAZEPAM 0.5 MG PO TABS: 0.5000 mg | ORAL_TABLET | Freq: Two times a day (BID) | ORAL | Status: DC | PRN

## 2011-09-26 MED ORDER — MONTELUKAST SODIUM 10 MG PO TABS
10.0000 mg | ORAL_TABLET | Freq: Every day | ORAL | Status: DC
  Administered 2011-09-26 – 2011-10-01 (×6): 10 mg via ORAL
  Filled 2011-09-26 (×7): qty 1

## 2011-09-26 MED ORDER — ONDANSETRON HCL 4 MG/2ML IJ SOLN
4.0000 mg | Freq: Four times a day (QID) | INTRAMUSCULAR | Status: DC | PRN
  Administered 2011-09-29: 4 mg via INTRAVENOUS
  Filled 2011-09-26: qty 2

## 2011-09-26 MED ORDER — LORAZEPAM 2 MG/ML IJ SOLN
0.5000 mg | Freq: Once | INTRAMUSCULAR | Status: AC
  Administered 2011-09-26: 0.5 mg via INTRAVENOUS
  Filled 2011-09-26: qty 1

## 2011-09-26 MED ORDER — LEVOTHYROXINE SODIUM 50 MCG PO TABS
50.0000 ug | ORAL_TABLET | Freq: Every day | ORAL | Status: DC
Start: 2011-09-27 — End: 2011-10-02
  Administered 2011-09-27 – 2011-10-02 (×6): 50 ug via ORAL
  Filled 2011-09-26 (×8): qty 1

## 2011-09-26 MED ORDER — METOPROLOL TARTRATE 25 MG PO TABS
25.0000 mg | ORAL_TABLET | Freq: Two times a day (BID) | ORAL | Status: DC
  Administered 2011-09-26: 25 mg via ORAL
  Filled 2011-09-26 (×4): qty 1

## 2011-09-26 MED ORDER — ONDANSETRON HCL 4 MG/2ML IJ SOLN
4.0000 mg | Freq: Once | INTRAMUSCULAR | Status: AC
  Administered 2011-09-26: 4 mg via INTRAVENOUS
  Filled 2011-09-26: qty 2

## 2011-09-26 MED ORDER — DOXYCYCLINE HYCLATE 100 MG IV SOLR
100.0000 mg | Freq: Two times a day (BID) | INTRAVENOUS | Status: DC
  Filled 2011-09-26: qty 100

## 2011-09-26 MED ORDER — ONDANSETRON HCL 4 MG PO TABS: 4.0000 mg | ORAL_TABLET | Freq: Four times a day (QID) | ORAL | Status: DC | PRN

## 2011-09-26 MED ORDER — SODIUM CHLORIDE 0.9 % IV SOLN
500.0000 mg | Freq: Two times a day (BID) | INTRAVENOUS | Status: DC
  Administered 2011-09-27: 500 mg via INTRAVENOUS
  Filled 2011-09-26 (×2): qty 0.5

## 2011-09-26 MED ORDER — ALBUTEROL SULFATE (5 MG/ML) 0.5% IN NEBU: 2.5000 mg | INHALATION_SOLUTION | RESPIRATORY_TRACT | Status: DC | PRN

## 2011-09-26 MED ORDER — FAMOTIDINE 20 MG PO TABS
20.0000 mg | ORAL_TABLET | Freq: Two times a day (BID) | ORAL | Status: DC
  Administered 2011-09-26 – 2011-09-29 (×6): 20 mg via ORAL
  Filled 2011-09-26 (×7): qty 1

## 2011-09-26 MED ORDER — GUAIFENESIN-DM 100-10 MG/5ML PO SYRP
5.0000 mL | ORAL_SOLUTION | ORAL | Status: DC | PRN
  Filled 2011-09-26: qty 5

## 2011-09-26 MED ORDER — PENTOSAN POLYSULFATE SODIUM 100 MG PO CAPS
100.0000 mg | ORAL_CAPSULE | Freq: Three times a day (TID) | ORAL | Status: DC
  Administered 2011-09-26 – 2011-10-02 (×18): 100 mg via ORAL
  Filled 2011-09-26 (×22): qty 1

## 2011-09-26 MED ORDER — ACETAMINOPHEN 10 MG/ML IV SOLN
1000.0000 mg | INTRAVENOUS | Status: AC
  Administered 2011-09-26: 1000 mg via INTRAVENOUS
  Filled 2011-09-26: qty 100

## 2011-09-26 MED ORDER — SODIUM CHLORIDE 0.9 % IV SOLN
INTRAVENOUS | Status: AC
  Administered 2011-09-26: 12:00:00 via INTRAVENOUS

## 2011-09-26 MED ORDER — SODIUM CHLORIDE 0.9 % IV BOLUS (SEPSIS)
1000.0000 mL | Freq: Once | INTRAVENOUS | Status: AC
  Administered 2011-09-26: 1000 mL via INTRAVENOUS

## 2011-09-26 MED ORDER — MAGNESIUM SULFATE 40 MG/ML IJ SOLN
2.0000 g | INTRAMUSCULAR | Status: AC
  Administered 2011-09-26: 2 g via INTRAVENOUS
  Filled 2011-09-26: qty 50

## 2011-09-26 MED ORDER — QUETIAPINE FUMARATE 25 MG PO TABS
25.0000 mg | ORAL_TABLET | Freq: Every day | ORAL | Status: DC
  Administered 2011-09-26 – 2011-10-01 (×6): 25 mg via ORAL
  Filled 2011-09-26 (×7): qty 1

## 2011-09-26 NOTE — ED Notes (Signed)
[place], PA-C notified since [phone] pt has been having 3-5 beat runs of vtach. Self resolving. Captured on the monitor. Pt denies chest pain at present. bp 143/54

## 2011-09-26 NOTE — ED Notes (Signed)
Pt has hx of bowel obstruction per family and has been recently cheked for it

## 2011-09-26 NOTE — ED Notes (Signed)
Dr Dierdre Highman notified of pt temp.

## 2011-09-26 NOTE — ED Notes (Signed)
Pt covered in diarrhea and what appears to be stool-like emesis.  Pt denies any pain.  AO x 4.  Sats of 65 on room air, placed on nrb and sats elevated to 100's.

## 2011-09-26 NOTE — Progress Notes (Signed)
Patient arrived to floor from ED. Alert and oriented. Noted skin tear to right groin/side mepilex applied.

## 2011-09-26 NOTE — ED Notes (Signed)
Daughter at bedside.  Filling in hx.

## 2011-09-26 NOTE — ED Notes (Signed)
EMS was called to home for fall.  Grandson went to house to pick pt up to go shopping and found her on the floor (1430, laying in diarrhea).  Grandson stayed with pt, helped her to bed and she fell again, trying to get out of bed.  EMS states they were called twice prior to 3 am for fall, but pt refused to come to hospital when grandson was finally able to convince her to come.  Per grandson Cinda Quest), pt AO per her norm.  Per EMS, pt vomited for first time in truck.  PT covered in diarrhea and looks and smells the same as the stool.

## 2011-09-26 NOTE — Consult Note (Signed)
Reason for Consult:  GASTROENTEROLOGY CONSULT Referring Physician: Hospitalist. Primary care: Dr. Park Meo  Leslie [name] is an 78 y.o. female.  HPI: Patient was admitted to the hospital with several issues. She recently had an upper respiratory infection and was placed on Levaquin which she completed. She tolerated the Levaquin poorly and had nausea vomiting and diarrhea. She had a temperature of 103. She was feeling weak and apparently was found at the assisted living facility covered with stool or vomitus and brought to the emergency. She had a temperature of 103. She was hydrated. Her daughter and grandson state that she has had episodes of nausea vomiting and diarrhea that occurred every 2-3 weeks recently but prior to this she has had them about every month. They last several hours to all day and then resolved completely. Between these episodes she is completely asymptomatic and at times has been constipated. She has a history of abdominal mass that she has had now for about 20 years following surgery. CT scans about 10 years ago showed a mass was changed very little. She did have a CT scan again showing a spiculated mass about 4 cm compared to 3.2 cm 4 years ago. Apparently this is been there for at least 20 years. She has had x-rays, CT scans, and surgical evaluation about 10 years or so ago it was decided that this mass was probably scar tissue or some benign tumor and that surgery was done indicate. The CT scan did not show any lymphadenopathy or any signs of bowel obstruction. The patient clinically feels much better since being admitted to the hospital. Her main complaint was diarrhea which appears to have resolved. Her labs on admission revealed hemoglobin of 16 and white blood count of 10.9. She denies abdominal pain at this time. Stool studies have been ordered but are still pending.  Past Medical History  Diagnosis Date  . Hypertension   . Fibromyalgia   . Bowel obstruction 1987    . Hypothyroidism   . Interstitial cystitis     Past Surgical History  Procedure Date  . Abdominal surgery   . Cholecystectomy   . Cystoscopy with urethral dilatation 02/2006; 07/2005; 01/2005    No family history on file.  Social History:  reports that she has never smoked. She does not have any smokeless tobacco history on file. She reports that she does not drink alcohol. Her drug history not on file.  Allergies:  Allergies  Allergen Reactions  . Compazine     hives  . Penicillins     rash  . Sulfa Antibiotics     rash  . Iohexol Rash    Patient states last time she had "CT contrast" she got a rash.    Medications: I have reviewed the patient's current medications.   Results for orders placed during the hospital encounter of 09/26/11 (from the past 48 hour(s))  CBC     Status: Abnormal   Collection Time   09/26/11  6:30 AM      Component Value Range Comment   WBC 10.9 (*) 4.0 - 10.5 (K/uL) WHITE COUNT CONFIRMED ON SMEAR   RBC 5.49 (*) 3.87 - 5.11 (MIL/uL)    Hemoglobin 16.2 (*) 12.0 - 15.0 (g/dL)    HCT 16.1 (*) 09.6 - 46.0 (%)    MCV 89.6  78.0 - 100.0 (fL)    MCH 29.5  26.0 - 34.0 (pg)    MCHC 32.9  30.0 - 36.0 (g/dL)    RDW  14.4  11.5 - 15.5 (%)    Platelets    150 - 400 (K/uL)    Value: PLATELET CLUMPS NOTED ON SMEAR, COUNT APPEARS ADEQUATE  DIFFERENTIAL     Status: Abnormal   Collection Time   09/26/11  6:30 AM      Component Value Range Comment   Neutrophils Relative 76  43 - 77 (%)    Lymphocytes Relative 16  12 - 46 (%)    Monocytes Relative 8  3 - 12 (%)    Eosinophils Relative 0  0 - 5 (%)    Basophils Relative 0  0 - 1 (%)    Neutro Abs 8.3 (*) 1.7 - 7.7 (K/uL)    Lymphs Abs 1.7  0.7 - 4.0 (K/uL)    Monocytes Absolute 0.9  0.1 - 1.0 (K/uL)    Eosinophils Absolute 0.0  0.0 - 0.7 (K/uL)    Basophils Absolute 0.0  0.0 - 0.1 (K/uL)    Smear Review        Value: PLATELET CLUMPS NOTED ON SMEAR, COUNT APPEARS DECREASED  PROCALCITONIN     Status: Normal    Collection Time   09/26/11  6:30 AM      Component Value Range Comment   Procalcitonin <0.10     COMPREHENSIVE METABOLIC PANEL     Status: Abnormal   Collection Time   09/26/11  6:32 AM      Component Value Range Comment   Sodium 135  135 - 145 (mEq/L)    Potassium 4.2  3.5 - 5.1 (mEq/L)    Chloride 97  96 - 112 (mEq/L)    CO2 20  19 - 32 (mEq/L)    Glucose, Bld 109 (*) 70 - 99 (mg/dL)    BUN 29 (*) 6 - 23 (mg/dL)    Creatinine, Ser 1.61 (*) 0.50 - 1.10 (mg/dL)    Calcium 9.4  8.4 - 10.5 (mg/dL)    Total Protein 8.9 (*) 6.0 - 8.3 (g/dL)    Albumin 4.2  3.5 - 5.2 (g/dL)    AST 096 (*) 0 - 37 (U/L)    ALT 45 (*) 0 - 35 (U/L)    Alkaline Phosphatase 65  39 - 117 (U/L)    Total Bilirubin 0.4  0.3 - 1.2 (mg/dL)    GFR calc non Af Amer 22 (*) >90 (mL/min)    GFR calc Af Amer 26 (*) >90 (mL/min)   LIPASE, BLOOD     Status: Normal   Collection Time   09/26/11  6:32 AM      Component Value Range Comment   Lipase 42  11 - 59 (U/L)   GLUCOSE, CAPILLARY     Status: Abnormal   Collection Time   09/26/11  6:37 AM      Component Value Range Comment   Glucose-Capillary 103 (*) 70 - 99 (mg/dL)   URINALYSIS, ROUTINE W REFLEX MICROSCOPIC     Status: Abnormal   Collection Time   09/26/11  8:23 AM      Component Value Range Comment   Color, Urine YELLOW  YELLOW     APPearance CLOUDY (*) CLEAR     Specific Gravity, Urine 1.020  1.005 - 1.030     pH 5.5  5.0 - 8.0     Glucose, UA NEGATIVE  NEGATIVE (mg/dL)    Hgb urine dipstick LARGE (*) NEGATIVE     Bilirubin Urine NEGATIVE  NEGATIVE     Ketones, ur NEGATIVE  NEGATIVE (mg/dL)  Protein, ur >300 (*) NEGATIVE (mg/dL)    Urobilinogen, UA 0.2  0.0 - 1.0 (mg/dL)    Nitrite NEGATIVE  NEGATIVE     Leukocytes, UA NEGATIVE  NEGATIVE    URINE MICROSCOPIC-ADD ON     Status: Abnormal   Collection Time   09/26/11  8:23 AM      Component Value Range Comment   Squamous Epithelial / LPF FEW (*) RARE     WBC, UA 0-2  <3 (WBC/hpf)    RBC / HPF 11-20  <3  (RBC/hpf)    Urine-Other AMORPHOUS URATES/PHOSPHATES     LACTIC ACID, PLASMA     Status: Normal   Collection Time   09/26/11  9:34 AM      Component Value Range Comment   Lactic Acid, Venous 0.9  0.5 - 2.2 (mmol/L)   CARDIAC PANEL(CRET KIN+CKTOT+MB+TROPI)     Status: Abnormal   Collection Time   09/26/11  9:37 AM      Component Value Range Comment   Total CK 3683 (*) 7 - 177 (U/L)    CK, MB 5.9 (*) 0.3 - 4.0 (ng/mL)    Troponin I <0.30  <0.30 (ng/mL)    Relative Index 0.2  0.0 - 2.5    INFLUENZA PANEL BY PCR     Status: Abnormal   Collection Time   09/26/11  3:53 PM      Component Value Range Comment   Influenza A By PCR POSITIVE (*) NEGATIVE     Influenza B By PCR NEGATIVE  NEGATIVE     H1N1 flu by pcr NOT DETECTED  NOT DETECTED      Dg Chest Port 1 View  09/26/2011  *RADIOLOGY REPORT*  Clinical Data: Shortness of breath.  PORTABLE CHEST - 1 VIEW 09/26/2011 0733 hours:  Comparison: Two-view chest x-ray 08/04/2011, 06/20/2011, 04/16/2011 [name] and 04/04/2008 [facility].  Findings: Respiratory motion blurs the image.  The patient's chin overlies the left lung apex.  Cardiac silhouette mildly enlarged for the AP portable technique, unchanged. Stable chronic elevation of the right hemidiaphragm and chronic scar/atelectasis at the right base.  Lungs otherwise clear.  Pulmonary vascularity normal. No visible pleural effusions.  Moderate sized hiatal hernia.  IMPRESSION: Stable mild cardiomegaly.  No acute cardiopulmonary disease. Stable chronic elevation of the right hemidiaphragm and associated chronic scar/atelectasis at the right base.  Hiatal hernia.  Original Report Authenticated By: Arnell Sieving, M.D.   Dg Abd Portable 1v  09/26/2011  *RADIOLOGY REPORT*  Clinical Data: Vomiting.  Diarrhea.  PORTABLE ABDOMEN - 1 VIEW  Comparison: 07/05/2011 CT.  Findings: Single supine view of the abdomen and pelvis.  Minimal convex left lumbar spine curvature.  Mild osteopenia.  Numerous wires  project over the abdomen.  Gas within nondistended large and small bowel.  No pneumatosis or definite free intraperitoneal air. Phleboliths in the pelvis. No abnormal abdominal calcifications. No appendicolith.  Minimal distal gas.  IMPRESSION: No acute findings.  Original Report Authenticated By: Consuello Bossier, M.D.    ROS: Primarily as noted above. Patient denies frequent dizziness. She denies abdominal pain and states that when she has these episodes his or vomiting and diarrhea. She denies blood.  Blood pressure 118/70, pulse 58, temperature 98.4 F (36.9 C), temperature source Rectal, resp. rate 18, height 5\' 3"  (1.6 m), weight 64.4 kg (141 lb 15.6 oz), SpO2 98.00%.  Physical exam:  General: An alert oriented pleasant white female who is sitting in a hospital bed tolerating a clear  liquid diet Eyes: Nonicteric Lungs: Clear Heart: Regular rate and rhythm without murmurs or gallop Abdomen: Nondistended and soft and generally nontender with excellent bowel sounds  Assessment/Plan: 1. Cough and fever. I suspect the patient has the flu. This is probably what is resulted in her falling and her acute admission. 2. Episodic nausea vomiting and diarrhea. This has occurred routinely for the past several years and she has long symptom-free intervals. At times she is actually constipated. I suspect that this is unrelated to her current illness. 3. Acute diarrhea. Unclear if related to her antibiotics i.e. C. differential or simply an exacerbation of her chronic episodic diarrhea. 4. Abdominal mass. This apparently has been there for 20 years and is 4 cm in size with minimal change. Given her age I would not recommend any further treatment.  Plan: We will follow her clinically and await results of her stool studies. This point I do not feel that she needs any diagnostic testing. We will add lactoferrin her stool studies.  Eldra Word JR,Anshi Jalloh L 09/26/2011, 6:14 PM

## 2011-09-26 NOTE — H&P (Signed)
[name] CSN:[id number],MRN:[id number]  Outpatient Primary MD for the patient is No primary provider on file.  With History of -  Past Medical History  Diagnosis Date  . Hypertension   . Fibromyalgia   . Bowel obstruction 1987  . Hypothyroidism   . Interstitial cystitis       Past Surgical History  Procedure Date  . Abdominal surgery   . Cholecystectomy     in for   Chief Complaint  Patient presents with  . Diarrhea  . Emesis  . Fall  . Cough     HPI  Leslie [name]  is a 78 y.o. female,  history of hypertension, abdominal mass which she was to follow had a [name] appointment for, per daughter episodes of nausea vomiting and diarrhea once or twice every year since the last few years, who has developed a chronic cough for the past few years, recently was diagnosed with upper respiratory infection and was placed on Levaquin, she finished her Levaquin yesterday, in the last few days she has developed nausea vomiting and diarrhea again, presented to the ER when she was feeling extremely weak and was found on the floor at home by grandson, apparently she was covered in stool and vomitus , she was then brought to [facility] where she had a temperature of 103.9, extreme weakness and fatigue along with dehydration and acute renal failure, she was placed briefly on nonrebreather mask, then transitioned to 3 L nasal cannula oxygen and I was called to admit the patient.    Review of Systems    Limited ROS patient is tiered, fatigued and sleeping, answers few questions, denies any headache denies any abdominal pain, does confirm to cough and mild shortness of breath.     Social History History  Substance Use Topics  . Smoking status: Never Smoker   . Smokeless tobacco: Not on file  . Alcohol Use: No      Family History No CAD  Prior to Admission medications   Medication Sig Start Date End Date Taking? Authorizing Provider  cloNIDine (CATAPRES) 0.1 MG tablet Take  0.1 mg by mouth 2 (two) times daily.     Yes Historical Provider, MD  diphenhydrAMINE (BENADRYL) 25 MG tablet Take 12.5-25 mg by mouth every 6 (six) hours as needed. For itching    Yes Historical Provider, MD  famotidine (PEPCID) 20 MG tablet Take 20 mg by mouth 2 (two) times daily.     Yes Historical Provider, MD  levofloxacin (LEVAQUIN) 250 MG tablet Take 250 mg by mouth daily. Beginning 09/19/11 for 7days For upper respiratory infection    Yes Historical Provider, MD  levothyroxine (SYNTHROID, LEVOTHROID) 50 MCG tablet Take 50 mcg by mouth daily.     Yes Historical Provider, MD  loperamide (IMODIUM) 2 MG capsule Take 2 mg by mouth 4 (four) times daily as needed. For loose stool    Yes Historical Provider, MD  LORazepam (ATIVAN) 1 MG tablet Take 1 mg by mouth every 12 (twelve) hours as needed. anxiety    Yes Historical Provider, MD  montelukast (SINGULAIR) 10 MG tablet Take 10 mg by mouth at bedtime.     Yes Historical Provider, MD  pentosan polysulfate (ELMIRON) 100 MG capsule Take 100 mg by mouth 3 (three) times daily before meals.     Yes Historical Provider, MD  QUEtiapine (SEROQUEL) 25 MG tablet Take 25 mg by mouth at bedtime.     Yes Historical Provider, MD  ranitidine (ZANTAC) 75 MG  tablet Take 75 mg by mouth 2 (two) times daily as needed. For indigestion    Yes Historical Provider, MD  rOPINIRole (REQUIP) 1 MG tablet Take 1-2 mg by mouth 2 (two) times daily. Take 1 tablet every morning Take 2 tablets at bedtime    Yes Historical Provider, MD  valsartan-hydrochlorothiazide (DIOVAN-HCT) 320-12.5 MG per tablet Take 1 tablet by mouth daily.     Yes Historical Provider, MD    Allergies  Allergen Reactions  . Compazine     hives  . Penicillins     rash  . Sulfa Antibiotics     rash  . Iohexol Rash    Patient states last time she had "CT contrast" she got a rash.    Physical Exam No intake or output data in the 24 hours ending 09/26/11 1116 Blood pressure 168/69, pulse 74,  temperature 98.5 F (36.9 C), temperature source Oral, resp. rate 20, SpO2 94.00%.   1. General frail elderly white female lying in bed in looking extremely tired and fatigued  2. Normal affect and insight  3. No F.N deficits, ALL C.Nerves Intact, Strength 5/5 all 4 extremities, Sensation intact all 4 extremities, Plantars down going.  4. Ears and Eyes appear Normal, Conjunctivae clear, PERRLA. Moist Oral Mucosa.  5. Supple Neck, No JVD, No cervical lymphadenopathy appriciated, No Carotid Bruits.  6. Symmetrical Chest wall movement, Good air movement bilaterally, few bibasilar Rales  7. RRR, No Gallops, Rubs or Murmurs, No Parasternal Heave.  8. Positive Bowel Sounds, Abdomen Soft, Non tender, No organomegaly appriciated,       No rebound -guarding or rigidity.  9.  No Cyanosis, Normal Skin Turgor, No Skin Rash or Bruise.  10. Good muscle tone,  joints appear normal , no effusions, Normal ROM.  11. No Palpable Lymph Nodes in Neck or Axillae    Data Review  CBC  Lab 09/26/11 0630  WBC 10.9*  HGB 16.2*  HCT 49.2*  PLT PLATELET CLUMPS NOTED ON SMEAR, COUNT APPEARS ADEQUATE  MCV 89.6  MCH 29.5  MCHC 32.9  RDW 14.4  LYMPHSABS 1.7  MONOABS 0.9  EOSABS 0.0  BASOSABS 0.0  BANDABS --   ------------------------------------------------------------------------------------------------------------------ Chemistries   Lab 09/26/11 0632  NA 135  K 4.2  CL 97  CO2 20  GLUCOSE 109*  BUN 29*  CREATININE 1.91*  CALCIUM 9.4  MG --  AST 124*  ALT 45*  ALKPHOS 65  BILITOT 0.4   ------------------------------------------------------------------------------------------------------------------ CrCl is unknown because there is no height on file for the current visit. ------------------------------------------------------------------------------------------------------------------ No results found for this basename: TSH,T4TOTAL,FREET3,T3FREE,THYROIDAB in the last 72  hours  Coagulation profile No results found for this basename: INR:5,PROTIME:5 in the last 168 hours ------------------------------------------------------------------------------------------------------------------- No results found for this basename: DDIMER:2 in the last 72 hours ------------------------------------------------------------------------------------------------------------------- Cardiac Enzymes  Lab 09/26/11 0937  CKMB 5.9*  TROPONINI <0.30  MYOGLOBIN --   ------------------------------------------------------------------------------------------------------------------ No components found with this basename: POCBNP:3 ------------------------------------------------------------------------------------------------------------------  Imaging results:   Dg Chest Port 1 View  09/26/2011  *RADIOLOGY REPORT*  Clinical Data: Shortness of breath.  PORTABLE CHEST - 1 VIEW 09/26/2011 0733 hours:  Comparison: Two-view chest x-ray 08/04/2011, 06/20/2011, 04/16/2011 [name] and 04/04/2008 [facility].  Findings: Respiratory motion blurs the image.  The patient's chin overlies the left lung apex.  Cardiac silhouette mildly enlarged for the AP portable technique, unchanged. Stable chronic elevation of the right hemidiaphragm and chronic scar/atelectasis at the right base.  Lungs otherwise clear.  Pulmonary vascularity normal. No  visible pleural effusions.  Moderate sized hiatal hernia.  IMPRESSION: Stable mild cardiomegaly.  No acute cardiopulmonary disease. Stable chronic elevation of the right hemidiaphragm and associated chronic scar/atelectasis at the right base.  Hiatal hernia.  Original Report Authenticated By: Arnell Sieving, M.D.      My personal review of EKG: Rhythm NSR, Rate  71 /min, QTc 368 , no Acute ST changes  Personally reviewed Old Chart from   Echo 2009 - Overall left ventricular systolic function was normal. Left ventricular ejection fraction was estimated  , range being 60 % to 65 %. There were no left ventricular regional wall otion abnormalities. Left ventricular wall thickness was ildly increased. Doppler parameters were consistent with levated mean left atrial filling pressure. - Aortic valve thickness was mildly increased. The aortic valve was mldly calcified. - There was mild mitral annular calcification. - The left atrium was mildly dilated.  CT 06-2011 There has been slight interval increase in the size of the soft tissue mass and associated desmoplastic reaction in the right mid abdomen, now measuring 4.4 cm, compared with 3.2 cm 4 years ago.  Differential diagnosis includes carcinoid tumor, intra-abdominal desmoid, or fibrosing mesenteritis. There is no associated bowel obstruction or inflammation, however.   Assessment & Plan  1. High fevers, shortness of breath, recent gastroenteritis like picture. We'll admit the patient to telemetry bed, oxygen and nebulizer treatments as needed, rule out influenza, rule out community acquired/aspiration pneumonia, pan culture her, aspiration precautions, empiric antibiotics. She has just finished a course with Levaquin, will place her on meropenem and doxycycline as she has penicillin allergy and monitor.  2. ARF on CKD 3 -due to #1 above, will hold ACE inhibitor, IV fluids and monitor.  3. History of abdominal mass questionable bowel obstruction in the past, have repeated a KUB which appears unremarkable, agent was to follow with GI in November, have requested [facility] to kindly see the patient, for now clear liquid diet and monitor. Discussed the plan with patient's daughter bedside, they do not want any major surgical procedures on the patient, however they are open for medical treatment and diagnostic studies.  4. H/O hypothyroidism, will check TSH and continue home dose Synthroid.  5. Few PVCs on telemetry, will monitor on telemetry, QTC unremarkable an EKG, will give her  some IV magnesium place her on beta blocker as tolerated.  6.H/O hypertension continue Catapres, add low-dose beta blocker, hold ACE inhibitor due to acute renal failure.  DVT Prophylaxis SCDs    AM Labs Ordered, also please review Full Orders  Admission, patients condition and plan of care including tests being ordered have been discussed with the patient and  Daughter who indicate understanding and agree with the plan and Code Status.  Code Status DNR/I  Condition GUARDED   Leroy Sea M.D on 09/26/2011 at 11:16 AM  [facility] Office  [phone]

## 2011-09-26 NOTE — ED Notes (Signed)
[facility] of [facility] states is getting cards  referral

## 2011-09-26 NOTE — ED Provider Notes (Signed)
History     CSN: [id number]  Arrival date & time 09/26/11  0550   First MD Initiated Contact with Patient 09/26/11 [phone]      Chief Complaint  Patient presents with  . Diarrhea  . Emesis  . Fall  . Cough    (Consider location/radiation/quality/duration/timing/severity/associated sxs/prior treatment) HPI Comments: Patient comes in with a chief complaint of weakness, vomiting, and diarrhea.  Patient came from home via EMS.  She apparently lives in an apartment complex for the elderly by herself.  She does not have any nursing care at this facility.  Apparently she was found on the bathroom floor by her grandson at approximately 2:30 pm yesterday.  She did not fall, but sat on the floor because she was feeling very weak after diarrhea and vomiting.  At that time grandson called EMS and EMS came to the house and checked her out.  Apparently EMS felt that patient was stable and patient refused to come to the hospital.   Grandson visited patient again this morning.  This time patient had slipped out of bed and had continued to have vomiting and diarrhea.  Grandson able to convince patient to call EMS this morning.  Upon arrival in the ED she was vomiting and had diarrhea.  Nursing staff in the ED report that the emesis and diarrhea were yellowish/greenish in color.  No blood observed.  She is not complaining of any pain at this time.  She reports that she has been vomiting and had diarrhea for the past 3 days.  She is unsure of how many times she has vomited.  Daughter reports that she has had vomiting and diarrhea intermittently over the past year, but it has been worse the last few days.  Review of the chart shows that she had a CT abdomen and pelvis done 07-05-11.  At this time it showed a soft tissue mass of the right mid abdomen.  No associated bowel obstruction or inflammation.  Patient is a 78 y.o. female presenting with diarrhea, vomiting, fall, and cough. The history is provided by the patient  (Daughter).  Diarrhea The primary symptoms include fever, nausea, vomiting and diarrhea. Primary symptoms do not include abdominal pain, melena, hematemesis, hematochezia, dysuria or rash.  The illness does not include chills, constipation or back pain.  Emesis  Associated symptoms include cough, diarrhea and a fever. Pertinent negatives include no abdominal pain, no chills and no headaches.  Fall Associated symptoms include a fever, nausea and vomiting. Pertinent negatives include no abdominal pain, no hematuria and no headaches.  Cough Associated symptoms include shortness of breath. Pertinent negatives include no chest pain, no chills and no headaches.    Past Medical History  Diagnosis Date  . Hypertension   . Bowel obstruction 1987  . Hypothyroidism   . Interstitial cystitis   . Hypercholesteremia   . Headache   . Anxiety   . Depression   . Multiple respiratory allergies   . Restless leg syndrome   . Stroke 06/2002    right parietal nonhemorrhagic acute infarction  . Abdominal mass   . Fibromyalgia   . Parkinson's disease     dx'd 1990's; told she doesn't have it ~ 2000  . Bradycardia, sinus     Past Surgical History  Procedure Date  . Abdominal surgery 1970's    "for blockage"  . Cystoscopy with urethral dilatation 02/2006; 07/2005; 01/2005  . Cholecystectomy   . Tonsillectomy   . Appendectomy   . Abdominal hysterectomy   .  Dilation and curettage of uterus 1940's  . Shoulder surgery 1970's    "for fx"    History reviewed. No pertinent family history.  History  Substance Use Topics  . Smoking status: Never Smoker   . Smokeless tobacco: Never Used  . Alcohol Use: No    OB History    Grav Para Term Preterm Abortions TAB SAB Ect Mult Living                  Review of Systems  Constitutional: Positive for fever and appetite change. Negative for chills and diaphoresis.  HENT: Negative for neck pain and neck stiffness.   Eyes: Negative for visual  disturbance.  Respiratory: Positive for cough and shortness of breath.   Cardiovascular: Negative for chest pain.  Gastrointestinal: Positive for nausea, vomiting and diarrhea. Negative for abdominal pain, constipation, blood in stool, melena, hematochezia, abdominal distention and hematemesis.  Genitourinary: Negative for dysuria and hematuria.  Musculoskeletal: Negative for back pain.  Skin: Negative for rash.  Neurological: Negative for dizziness, syncope and headaches.  Psychiatric/Behavioral: Negative for confusion.    Allergies  Compazine; Penicillins; Sulfa antibiotics; and Iohexol  Home Medications  No current outpatient prescriptions on file.  BP 118/70  Pulse 58  Temp(Src) 98.4 F (36.9 C) (Rectal)  Resp 18  Ht 5\' 3"  (1.6 m)  Wt 141 lb 15.6 oz (64.4 kg)  BMI 25.15 kg/m2  SpO2 98%  Physical Exam  Nursing note and vitals reviewed. Constitutional: She is oriented to person, place, and time. She appears well-developed. No distress.       Patient appears dehydrated  HENT:  Head: Normocephalic and atraumatic.  Neck: Normal range of motion. Neck supple.  Cardiovascular: Normal rate, regular rhythm and normal heart sounds.   Pulmonary/Chest: No accessory muscle usage. Tachypnea noted. No respiratory distress. She has no wheezes. She has rhonchi.       Diffuse coarse breath sounds  Abdominal: Soft. Bowel sounds are normal. She exhibits no distension and no mass. There is no tenderness. There is no rebound and no guarding.  Musculoskeletal: Normal range of motion.  Neurological: She is alert and oriented to person, place, and time.  Skin: Skin is warm and dry. No rash noted. She is not diaphoretic.  Psychiatric: She has a normal mood and affect.    ED Course  Procedures (including critical care time)  Labs Reviewed  CBC - Abnormal; Notable for the following:    WBC 10.9 (*) WHITE COUNT CONFIRMED ON SMEAR   RBC 5.49 (*)    Hemoglobin 16.2 (*)    HCT 49.2 (*)    All  other components within normal limits  DIFFERENTIAL - Abnormal; Notable for the following:    Neutro Abs 8.3 (*)    All other components within normal limits  URINALYSIS, ROUTINE W REFLEX MICROSCOPIC - Abnormal; Notable for the following:    APPearance CLOUDY (*)    Hgb urine dipstick LARGE (*)    Protein, ur >300 (*)    All other components within normal limits  COMPREHENSIVE METABOLIC PANEL - Abnormal; Notable for the following:    Glucose, Bld 109 (*)    BUN 29 (*)    Creatinine, Ser 1.91 (*)    Total Protein 8.9 (*)    AST 124 (*)    ALT 45 (*)    GFR calc non Af Amer 22 (*)    GFR calc Af Amer 26 (*)    All other components within normal limits  CARDIAC PANEL(CRET KIN+CKTOT+MB+TROPI) - Abnormal; Notable for the following:    Total CK 3683 (*)    CK, MB 5.9 (*)    All other components within normal limits  URINE MICROSCOPIC-ADD ON - Abnormal; Notable for the following:    Squamous Epithelial / LPF FEW (*)    All other components within normal limits  INFLUENZA PANEL BY PCR - Abnormal; Notable for the following:    Influenza A By PCR POSITIVE (*)    All other components within normal limits  GLUCOSE, CAPILLARY - Abnormal; Notable for the following:    Glucose-Capillary 103 (*)    All other components within normal limits  LIPASE, BLOOD  LACTIC ACID, PLASMA  PROCALCITONIN  PROTIME-INR  CLOSTRIDIUM DIFFICILE CULTURE-FECAL  STOOL CULTURE  CULTURE, BLOOD (ROUTINE X 2)  CULTURE, BLOOD (ROUTINE X 2)  CULTURE, SPUTUM-ASSESSMENT  TSH  BASIC METABOLIC PANEL  CBC  FECAL LACTOFERRIN  STOOL CULTURE  OVA AND PARASITE EXAMINATION     1. Hypoxia   2. Acute renal failure   3. Vomiting and diarrhea   4. Weakness   5. Fever   6. Hypothyroidism   7. Bowel obstruction   8. Hypertension     7:57 PM Decision was made to give IV tylenol because patient was unable to tolerate po medications.  Elevated LFT's are noted at this time.  MDM  Patient will be admitted to the  hospital for weakness, dehydration, and persistent nausea and vomiting.        Pascal Lux [facility] 09/26/11 2013  Medical screening examination/treatment/procedure(s) were conducted as a shared visit with non-physician practitioner(s) and myself.  I personally evaluated the patient during the encounter. No ABD peritonitis, treated fever with IV tylenol as PT actively vomiting with diarrhea and temp 103. MED admit  Sunnie Nielsen, MD 09/26/11 2306

## 2011-09-26 NOTE — ED Notes (Signed)
[id number] Ready

## 2011-09-26 NOTE — Progress Notes (Signed)
ANTIBIOTIC CONSULT NOTE - INITIAL  Pharmacy Consult for Meropenem Indication: Nausea / Vomiting / Fever of 103  Allergies  Allergen Reactions  . Compazine     hives  . Penicillins     rash  . Sulfa Antibiotics     rash  . Iohexol Rash    Patient states last time she had "CT contrast" she got a rash.    Patient Measurements:     Vital Signs: Temp: 98.5 F (36.9 C) (01/02 0955) Temp src: Oral (01/02 0955) BP: 140/56 mmHg (01/02 1030) Pulse Rate: 80  (01/02 1030) Intake/Output from previous day:   Intake/Output from this shift:    Labs:  Basename 09/26/11 0632 09/26/11 0630  WBC -- 10.9*  HGB -- 16.2*  PLT -- PLATELET CLUMPS NOTED ON SMEAR, COUNT APPEARS ADEQUATE  LABCREA -- --  CREATININE 1.91* --   CrCl is unknown because there is no height on file for the current visit. No results found for this basename: VANCOTROUGH:2,VANCOPEAK:2,VANCORANDOM:2,GENTTROUGH:2,GENTPEAK:2,GENTRANDOM:2,TOBRATROUGH:2,TOBRAPEAK:2,TOBRARND:2,AMIKACINPEAK:2,AMIKACINTROU:2,AMIKACIN:2, in the last 72 hours   Microbiology: No results found for this or any previous visit (from the past 720 hour(s)).  Medical History: Past Medical History  Diagnosis Date  . Hypertension   . Fibromyalgia   . Bowel obstruction 1987  . Hypothyroidism   . Interstitial cystitis     Medications:  Scheduled:    . acetaminophen  1,000 mg Intravenous To Major  . doxycycline (VIBRAMYCIN) IV  100 mg Intravenous Q12H  . doxycycline (VIBRAMYCIN) IV  100 mg Intravenous To Major  . LORazepam  0.5 mg Intravenous Once  . magnesium sulfate 1 - 4 g bolus IVPB  2 g Intravenous To Major  . ondansetron (ZOFRAN) IV  4 mg Intravenous Once  . sodium chloride  1,000 mL Intravenous Once   Assessment: 78 year old female with renal insufficiency who was recently diagnosed with URI and placed on Levaquin.  She finished her Levaquin 1.1.13 and now presents to the ED with vomiting, nausea, and fever of 103.9.  Starting  doxycycline and meropenem for empiric coverage.  Also has an abdominal mass.  Goal of Therapy:  Appropriate dosing  Plan:  1) Meropenem 500 mg IV Q 12 hours for now 2) Follow up weight 3) Follow up renal function, plan  Thank you.   Elwin Sleight 09/26/2011,11:51 AM

## 2011-09-26 NOTE — ED Notes (Signed)
Pharmacy called to send acetaminophen

## 2011-09-26 NOTE — ED Notes (Signed)
Admit Doctor at bedside.  

## 2011-09-27 ENCOUNTER — Inpatient Hospital Stay (HOSPITAL_COMMUNITY): Payer: Medicare Other

## 2011-09-27 LAB — CBC
MCH: 28.1 pg (ref 26.0–34.0)
MCHC: 31.3 g/dL (ref 30.0–36.0)
Platelets: 128 10*3/uL — ABNORMAL LOW (ref 150–400)
RDW: 14.5 % (ref 11.5–15.5)

## 2011-09-27 LAB — BASIC METABOLIC PANEL
BUN: 37 mg/dL — ABNORMAL HIGH (ref 6–23)
CO2: 19 mEq/L (ref 19–32)
Calcium: 8.3 mg/dL — ABNORMAL LOW (ref 8.4–10.5)
Creatinine, Ser: 1.79 mg/dL — ABNORMAL HIGH (ref 0.50–1.10)
Glucose, Bld: 97 mg/dL (ref 70–99)

## 2011-09-27 LAB — TSH: TSH: 2.014 u[IU]/mL (ref 0.350–4.500)

## 2011-09-27 MED ORDER — OSELTAMIVIR PHOSPHATE 75 MG PO CAPS
75.0000 mg | ORAL_CAPSULE | Freq: Every day | ORAL | Status: DC
  Administered 2011-09-27: 75 mg via ORAL
  Filled 2011-09-27: qty 1

## 2011-09-27 MED ORDER — OSELTAMIVIR PHOSPHATE 75 MG PO CAPS
75.0000 mg | ORAL_CAPSULE | Freq: Two times a day (BID) | ORAL | Status: DC
  Administered 2011-09-27 – 2011-09-28 (×2): 75 mg via ORAL
  Filled 2011-09-27 (×3): qty 1

## 2011-09-27 MED ORDER — AMLODIPINE BESYLATE 10 MG PO TABS
10.0000 mg | ORAL_TABLET | Freq: Every day | ORAL | Status: DC
  Administered 2011-09-27 – 2011-10-02 (×6): 10 mg via ORAL
  Filled 2011-09-27 (×6): qty 1

## 2011-09-27 NOTE — Progress Notes (Signed)
Subjective: States no further diarrhea today, denies nausea/vomiting, denies abdominal pain also denies cough and shortness of breath. Objective: Vital signs in last 24 hours: Temp:  [98.1 F (36.7 C)-98.6 F (37 C)] 98.1 F (36.7 C) (01/03 0439) Pulse Rate:  [45-58] 50  (01/03 0439) Resp:  [16-18] 18  (01/03 0439) BP: (116-172)/(51-71) 162/71 mmHg (01/03 0439) SpO2:  [95 %-98 %] 97 % (01/03 0439) FiO2 (%):  [3 %] 3 % (01/02 1632) Weight:  [64.4 kg (141 lb 15.6 oz)-65.1 kg (143 lb 8.3 oz)] 143 lb 8.3 oz (65.1 kg) (01/03 0439) Last BM Date: 09/26/11 Intake/Output from previous day: 01/02 0701 - 01/03 0700 In: 100 [I.V.:100] Out: 325 [Urine:325] Intake/Output this shift: Total I/O In: 210 [P.O.:210] Out: -     General Appearance:    Alert, cooperative, no distress, appears stated age  Lungs:     Clear to auscultation bilaterally, respirations unlabored   Heart:    Regular rate and rhythm, S1 and S2 normal, no murmur, rub   or gallop  Abdomen:     Soft, non-tender, bowel sounds active all four quadrants,    no masses, no organomegaly  Extremities:   Extremities normal, atraumatic, no cyanosis or edema  Neurologic:   CNII-XII intact, nonfocal      Weight change:   Intake/Output Summary (Last 24 hours) at 09/27/11 1040 Last data filed at 09/27/11 0850  Gross per 24 hour  Intake    310 ml  Output    325 ml  Net    -15 ml    Lab Results:   Basename 09/27/11 0030 09/26/11 0632  NA 135 135  K 4.1 4.2  CL 103 97  CO2 19 20  GLUCOSE 97 109*  BUN 37* 29*  CREATININE 1.79* 1.91*  CALCIUM 8.3* 9.4    Basename 09/27/11 0100 09/26/11 0630  WBC 6.7 10.9*  HGB 12.8 16.2*  HCT 40.9 49.2*  PLT 128* PLATELET CLUMPS NOTED ON SMEAR, COUNT APPEARS ADEQUATE  MCV 89.9 89.6   PT/INR  Basename 09/26/11 1755  LABPROT 14.9  INR 1.15   ABG No results found for this basename: PHART:2,PCO2:2,PO2:2,HCO3:2 in the last 72 hours  Micro Results: Recent Results (from the past  240 hour(s))  CULTURE, BLOOD (ROUTINE X 2)     Status: Normal (Preliminary result)   Collection Time   09/26/11 10:50 AM      Component Value Range Status Comment   Specimen Description BLOOD RIGHT ARM   Final    Special Requests BOTTLES DRAWN AEROBIC AND ANAEROBIC 10CC   Final    Setup Time [id number]   Final    Culture     Final    Value:        BLOOD CULTURE RECEIVED NO GROWTH TO DATE CULTURE WILL BE HELD FOR 5 DAYS BEFORE ISSUING A FINAL NEGATIVE REPORT   Report Status PENDING   Incomplete   CULTURE, BLOOD (ROUTINE X 2)     Status: Normal (Preliminary result)   Collection Time   09/26/11 10:55 AM      Component Value Range Status Comment   Specimen Description BLOOD RIGHT ARM   Final    Special Requests BOTTLES DRAWN AEROBIC ONLY 5CC   Final    Setup Time [id number]   Final    Culture     Final    Value:        BLOOD CULTURE RECEIVED NO GROWTH TO DATE CULTURE WILL BE HELD FOR 5 DAYS BEFORE ISSUING  A FINAL NEGATIVE REPORT   Report Status PENDING   Incomplete    Studies/Results: Portable Chest 1 View  09/27/2011  *RADIOLOGY REPORT*  Clinical Data: Cough  PORTABLE CHEST - 1 VIEW  Comparison: September 25, 2037  Findings: Elevation of the right hemi diaphragm and right basilar atelectasis are unchanged.  The left lung is clear.  The cardiac silhouette, mediastinum, pulmonary vasculature are within normal limits.  IMPRESSION: Stable chest x-ray with no evidence of acute cardiac or pulmonary process.  Original Report Authenticated By: Brandon Melnick, M.D.   Dg Chest Port 1 View  09/26/2011  *RADIOLOGY REPORT*  Clinical Data: Shortness of breath.  PORTABLE CHEST - 1 VIEW 09/26/2011 0733 hours:  Comparison: Two-view chest x-ray 08/04/2011, 06/20/2011, 04/16/2011 [name] and 04/04/2008 [facility].  Findings: Respiratory motion blurs the image.  The patient's chin overlies the left lung apex.  Cardiac silhouette mildly enlarged for the AP portable technique, unchanged. Stable chronic elevation  of the right hemidiaphragm and chronic scar/atelectasis at the right base.  Lungs otherwise clear.  Pulmonary vascularity normal. No visible pleural effusions.  Moderate sized hiatal hernia.  IMPRESSION: Stable mild cardiomegaly.  No acute cardiopulmonary disease. Stable chronic elevation of the right hemidiaphragm and associated chronic scar/atelectasis at the right base.  Hiatal hernia.  Original Report Authenticated By: Arnell Sieving, M.D.   Dg Abd Portable 1v  09/26/2011  *RADIOLOGY REPORT*  Clinical Data: Vomiting.  Diarrhea.  PORTABLE ABDOMEN - 1 VIEW  Comparison: 07/05/2011 CT.  Findings: Single supine view of the abdomen and pelvis.  Minimal convex left lumbar spine curvature.  Mild osteopenia.  Numerous wires project over the abdomen.  Gas within nondistended large and small bowel.  No pneumatosis or definite free intraperitoneal air. Phleboliths in the pelvis. No abnormal abdominal calcifications. No appendicolith.  Minimal distal gas.  IMPRESSION: No acute findings.  Original Report Authenticated By: Consuello Bossier, M.D.   Medications:  Scheduled Meds:   . cloNIDine  0.1 mg Oral BID  . doxycycline (VIBRAMYCIN) IV  100 mg Intravenous To Major  . famotidine  20 mg Oral BID  . levothyroxine  50 mcg Oral Q breakfast  . magnesium sulfate 1 - 4 g bolus IVPB  2 g Intravenous To Major  . meropenem (MERREM) IV  500 mg Intravenous To Major  . meropenem (MERREM) IV  500 mg Intravenous Q12H  . metoprolol tartrate  25 mg Oral BID  . montelukast  10 mg Oral QHS  . pentosan polysulfate  100 mg Oral TID AC  . QUEtiapine  25 mg Oral QHS  . rOPINIRole  1-2 mg Oral BID  . DISCONTD: doxycycline (VIBRAMYCIN) IV  100 mg Intravenous Q12H   Continuous Infusions:   . sodium chloride 100 mL/hr at 09/26/11 1136   PRN Meds:.albuterol, guaiFENesin-dextromethorphan, HYDROcodone-acetaminophen, LORazepam, ondansetron (ZOFRAN) IV, ondansetron Assessment/Plan: Patient Active Hospital Problem  List: Influenza A  -Likely etiology of fevers., followup chest x-ray still negative all for pneumonia. Agree with discontinuing arm IV antibiotics  -Tamiflu changed to treatment dose bid  Weakness generalized (09/26/2011) Diarrhea (09/26/2011) -Resolving, appreciate GI assistance, likely antibiotic associated up    Fever (09/26/2011)  secondary to #1 as above.   Hypothyroidism () -continue synthroid.   Hypertension () -Continue outpatient medications ARF -Improving with hydration, follow and recheck.     LOS: 1 day   Ezequiel Macauley C 09/27/2011, 10:40 AM

## 2011-09-27 NOTE — Progress Notes (Signed)
Subjective: Reports the diarrhea is better. No nausea hungry.  Objective: Vital signs in last 24 hours: Temp:  [98.1 F (36.7 C)-98.6 F (37 C)] 98.1 F (36.7 C) (01/03 0439) Pulse Rate:  [45-80] 50  (01/03 0439) Resp:  [16-20] 18  (01/03 0439) BP: (116-172)/(51-71) 162/71 mmHg (01/03 0439) SpO2:  [94 %-98 %] 97 % (01/03 0439) FiO2 (%):  [3 %] 3 % (01/02 1632) Weight:  [64.4 kg (141 lb 15.6 oz)-65.1 kg (143 lb 8.3 oz)] 143 lb 8.3 oz (65.1 kg) (01/03 0439) Last BM Date: 09/26/11  Intake/Output from previous day: 01/02 0701 - 01/03 0700 In: 100 [I.V.:100] Out: 325 [Urine:325] Intake/Output this shift: Total I/O In: 210 [P.O.:210] Out: -   PE: Alert Abd--soft nontender + BSs  Lab Results:  Basename 09/27/11 0100 09/26/11 0630  WBC 6.7 10.9*  HGB 12.8 16.2*  HCT 40.9 49.2*  PLT 128* PLATELET CLUMPS NOTED ON SMEAR, COUNT APPEARS ADEQUATE   BMET  Basename 09/27/11 0030 09/26/11 0632  NA 135 135  K 4.1 4.2  CL 103 97  CO2 19 20  CREATININE 1.79* 1.91*   LFT  Basename 09/26/11 0632  PROT 8.9*  AST 124*  ALT 45*  ALKPHOS 65  BILITOT 0.4  BILIDIR --  IBILI --   PT/INR  Basename 09/26/11 1755  LABPROT 14.9  INR 1.15   Hepatitis Panel No results found for this basename: HEPBSAG,HCVAB,HEPAIGM,HEPBIGM in the last 72 hours     Studies/Results: Portable Chest 1 View  09/27/2011  *RADIOLOGY REPORT*  Clinical Data: Cough  PORTABLE CHEST - 1 VIEW  Comparison: September 25, 2037  Findings: Elevation of the right hemi diaphragm and right basilar atelectasis are unchanged.  The left lung is clear.  The cardiac silhouette, mediastinum, pulmonary vasculature are within normal limits.  IMPRESSION: Stable chest x-ray with no evidence of acute cardiac or pulmonary process.  Original Report Authenticated By: Brandon Melnick, M.D.   Dg Chest Port 1 View  09/26/2011  *RADIOLOGY REPORT*  Clinical Data: Shortness of breath.  PORTABLE CHEST - 1 VIEW 09/26/2011 0733 hours:   Comparison: Two-view chest x-ray 08/04/2011, 06/20/2011, 04/16/2011 [name] and 04/04/2008 [facility].  Findings: Respiratory motion blurs the image.  The patient's chin overlies the left lung apex.  Cardiac silhouette mildly enlarged for the AP portable technique, unchanged. Stable chronic elevation of the right hemidiaphragm and chronic scar/atelectasis at the right base.  Lungs otherwise clear.  Pulmonary vascularity normal. No visible pleural effusions.  Moderate sized hiatal hernia.  IMPRESSION: Stable mild cardiomegaly.  No acute cardiopulmonary disease. Stable chronic elevation of the right hemidiaphragm and associated chronic scar/atelectasis at the right base.  Hiatal hernia.  Original Report Authenticated By: Arnell Sieving, M.D.   Dg Abd Portable 1v  09/26/2011  *RADIOLOGY REPORT*  Clinical Data: Vomiting.  Diarrhea.  PORTABLE ABDOMEN - 1 VIEW  Comparison: 07/05/2011 CT.  Findings: Single supine view of the abdomen and pelvis.  Minimal convex left lumbar spine curvature.  Mild osteopenia.  Numerous wires project over the abdomen.  Gas within nondistended large and small bowel.  No pneumatosis or definite free intraperitoneal air. Phleboliths in the pelvis. No abnormal abdominal calcifications. No appendicolith.  Minimal distal gas.  IMPRESSION: No acute findings.  Original Report Authenticated By: Consuello Bossier, M.D.    Medications: I have reviewed the patient's current medications.  Assessment/Plan: 1. Diarrhea. C diff is negative.  Suspect antibiotic diarrhea improving. Main issues now appear to be cardiac.  Will advance  diet to FLs.   Leslie [name],Leslie [name] 09/27/2011, 9:14 AM

## 2011-09-27 NOTE — Progress Notes (Signed)
Speech Language/Pathology Clinical/Bedside Swallow Evaluation Patient Details  Name: Leslie [name] MRN: [id number] DOB: 07/15/1922 Today's Date: 09/27/2011  Past Medical History:  Past Medical History  Diagnosis Date  . Hypertension   . Bowel obstruction 1987  . Hypothyroidism   . Interstitial cystitis   . Hypercholesteremia   . Headache   . Anxiety   . Depression   . Multiple respiratory allergies   . Restless leg syndrome   . Stroke 06/2002    right parietal nonhemorrhagic acute infarction  . Abdominal mass   . Fibromyalgia   . Parkinson's disease     dx'd 1990's; told she doesn't have it ~ 2000  . Bradycardia, sinus    Past Surgical History:  Past Surgical History  Procedure Date  . Abdominal surgery 1970's    "for blockage"  . Cystoscopy with urethral dilatation 02/2006; 07/2005; 01/2005  . Cholecystectomy   . Tonsillectomy   . Appendectomy   . Abdominal hysterectomy   . Dilation and curettage of uterus 1940's  . Shoulder surgery 1970's    "for fx"   HPI:  Pt is an 78 year old female admitted after being found on the ground in vomit and feces. Pt believed to have the flu, MD wanted to rule out aspiration as the cause of infection. Pt with a long history of lower GI problems. Pt reports she has never had trouble swallowing and prefers a vegetarian diet.    Assessment/Recommendations/Treatment Plan    SLP Assessment Clinical Impression Statement: Pt with no s/s of aspiration, functional consumption of solid and liquid POs. Pt is edentulous and reports and demonstrated she is able to chew solids but does not eat meat as a rule. Pt my initiate a vegetarian, regular consistency diet when cleared by MD and continue thin liquids with no significant aspiration risk.  Risk for Aspiration: None  Recommendations Solid Consistency: Regular Liquid Consistency: Thin Medication Administration: Whole meds with liquid Oral Care Recommendations: Oral care QID;Patient  independent with oral care  Treatment Plan Treatment Plan Recommendations: No treatment recommended at this time     Individuals Consulted Consulted and Agree with Results and Recommendations: Patient  Harlon Ditty, MA CCC-SLP [phone]  Claudine Mouton 09/27/2011,9:54 AM

## 2011-09-27 NOTE — Progress Notes (Signed)
NP Craige Cotta notified of sinus bradycardia and new orders received and carried out. EKG given the NP

## 2011-09-27 NOTE — Significant Event (Signed)
RN called this NP b/c pt had brady on tele of 36-39. SBP in the 1teens. Stat EKG showed marked brady of 42 BPM and occasional PVC. EKG compared to one earlier today and both reviewed by Dr. Toniann Fail as well. Orders to hold Lopressor and Clonidine for now (last dose 1700 and 2000, respectively). RN to watch closely and call if HR in the 30's again. Consider cardio consult in a.m. Or sooner if needed. BMP, TSH, and Mg++ level ordered stat. Will follow. Next stage of plan depends of results of labs and further monitoring.  Maren Reamer, NP [facility] Case discussed with Dr. Toniann Fail

## 2011-09-28 LAB — BASIC METABOLIC PANEL
BUN: 40 mg/dL — ABNORMAL HIGH (ref 6–23)
CO2: 22 mEq/L (ref 19–32)
Chloride: 98 mEq/L (ref 96–112)
Creatinine, Ser: 1.73 mg/dL — ABNORMAL HIGH (ref 0.50–1.10)
Glucose, Bld: 89 mg/dL (ref 70–99)

## 2011-09-28 LAB — SODIUM, URINE, RANDOM: Sodium, Ur: 44 mEq/L

## 2011-09-28 LAB — OSMOLALITY: Osmolality: 288 mOsm/kg (ref 275–300)

## 2011-09-28 MED ORDER — HYDRALAZINE HCL 25 MG PO TABS
25.0000 mg | ORAL_TABLET | Freq: Four times a day (QID) | ORAL | Status: DC
  Administered 2011-09-28 – 2011-10-02 (×15): 25 mg via ORAL
  Filled 2011-09-28 (×20): qty 1

## 2011-09-28 MED ORDER — OSELTAMIVIR PHOSPHATE 75 MG PO CAPS
75.0000 mg | ORAL_CAPSULE | Freq: Every day | ORAL | Status: DC
  Administered 2011-09-29 – 2011-10-02 (×4): 75 mg via ORAL
  Filled 2011-09-28 (×4): qty 1

## 2011-09-28 NOTE — Plan of Care (Signed)
Problem: Phase I Progression Outcomes Goal: Initial discharge plan identified Outcome: Completed/Met Date Met:  09/28/11 Family feels pt. Will need ST-SNF for rehab. Before going back home.  Orders received for PT/OT eval and SW

## 2011-09-28 NOTE — Progress Notes (Addendum)
Leslie [name] CSN:[id number],MRN:[id number] is a 78 y.o. female,  Outpatient Primary MD for the patient is No primary provider on file.  Chief Complaint  Patient presents with  . Diarrhea  . Emesis  . Fall  . Cough        Subjective:   Leslie [name] today has, No headache, No chest pain, No abdominal pain - No Nausea, No new weakness tingling or numbness, No Cough - SOB.    Objective:   Filed Vitals:   09/27/11 2121 09/28/11 0441 09/28/11 1041 09/28/11 1359  BP: 178/60 162/61 130/55 120/69  Pulse: 66 59  59  Temp: 97.7 F (36.5 C) 98.7 F (37.1 C)  98 F (36.7 C)  TempSrc: Oral Oral  Oral  Resp: 20 18  20   Height:      Weight:      SpO2: 92% 94%  94%    Wt Readings from Last 3 Encounters:  09/27/11 65.1 kg (143 lb 8.3 oz)     Intake/Output Summary (Last 24 hours) at 09/28/11 1447 Last data filed at 09/28/11 1300  Gross per 24 hour  Intake    358 ml  Output    450 ml  Net    -92 ml    Exam Awake Alert, Oriented *3, No new F.N deficits, Normal affect [place].AT,PERRAL Supple Neck,No JVD, No cervical lymphadenopathy appriciated.  Symmetrical Chest wall movement, Good air movement bilaterally, CTAB RRR,No Gallops,Rubs or new Murmurs, No Parasternal Heave +ve B.Sounds, Abd Soft, Non tender, No organomegaly appriciated, No rebound -guarding or rigidity. No Cyanosis, Clubbing or edema, No new Rash or bruise     Data Review  CBC  Lab 09/27/11 0100 09/26/11 0630  WBC 6.7 10.9*  HGB 12.8 16.2*  HCT 40.9 49.2*  PLT 128* PLATELET CLUMPS NOTED ON SMEAR, COUNT APPEARS ADEQUATE  MCV 89.9 89.6  MCH 28.1 29.5  MCHC 31.3 32.9  RDW 14.5 14.4  LYMPHSABS -- 1.7  MONOABS -- 0.9  EOSABS -- 0.0  BASOSABS -- 0.0  BANDABS -- --    Chemistries   Lab 09/28/11 0510 09/27/11 0030 09/26/11 0632  NA 132* 135 135  K 4.2 4.1 4.2  CL 98 103 97  CO2 22 19 20   GLUCOSE 89 97 109*  BUN 40* 37* 29*  CREATININE 1.73* 1.79* 1.91*  CALCIUM 8.5 8.3* 9.4  MG -- 2.2 --  AST -- -- 124*    ALT -- -- 45*  ALKPHOS -- -- 65  BILITOT -- -- 0.4   ------------------------------------------------------------------------------------------------------------------ estimated creatinine clearance is 20.8 ml/min (by C-G formula based on Cr of 1.73). ------------------------------------------------------------------------------------------------------------------ No results found for this basename: HGBA1C:2 in the last 72 hours ------------------------------------------------------------------------------------------------------------------ No results found for this basename: CHOL:2,HDL:2,LDLCALC:2,TRIG:2,CHOLHDL:2,LDLDIRECT:2 in the last 72 hours ------------------------------------------------------------------------------------------------------------------  Basename 09/27/11 0030  TSH 2.014  T4TOTAL --  T3FREE --  THYROIDAB --   ------------------------------------------------------------------------------------------------------------------ No results found for this basename: VITAMINB12:2,FOLATE:2,FERRITIN:2,TIBC:2,IRON:2,RETICCTPCT:2 in the last 72 hours  Coagulation profile  Lab 09/26/11 1755  INR 1.15  PROTIME --    No results found for this basename: DDIMER:2 in the last 72 hours  Cardiac Enzymes  Lab 09/26/11 0937  CKMB 5.9*  TROPONINI <0.30  MYOGLOBIN --   ------------------------------------------------------------------------------------------------------------------ No components found with this basename: POCBNP:3  Micro Results Recent Results (from the past 240 hour(s))  CULTURE, BLOOD (ROUTINE X 2)     Status: Normal (Preliminary result)   Collection Time   09/26/11 10:50 AM      Component Value Range Status Comment  Specimen Description BLOOD RIGHT ARM   Final    Special Requests BOTTLES DRAWN AEROBIC AND ANAEROBIC 10CC   Final    Setup Time [id number]   Final    Culture     Final    Value:        BLOOD CULTURE RECEIVED NO GROWTH TO DATE CULTURE  WILL BE HELD FOR 5 DAYS BEFORE ISSUING A FINAL NEGATIVE REPORT   Report Status PENDING   Incomplete   CULTURE, BLOOD (ROUTINE X 2)     Status: Normal (Preliminary result)   Collection Time   09/26/11 10:55 AM      Component Value Range Status Comment   Specimen Description BLOOD RIGHT ARM   Final    Special Requests BOTTLES DRAWN AEROBIC ONLY 5CC   Final    Setup Time [id number]   Final    Culture     Final    Value:        BLOOD CULTURE RECEIVED NO GROWTH TO DATE CULTURE WILL BE HELD FOR 5 DAYS BEFORE ISSUING A FINAL NEGATIVE REPORT   Report Status PENDING   Incomplete     Radiology Reports Portable Chest 1 View  09/27/2011  *RADIOLOGY REPORT*  Clinical Data: Cough  PORTABLE CHEST - 1 VIEW  Comparison: September 25, 2037  Findings: Elevation of the right hemi diaphragm and right basilar atelectasis are unchanged.  The left lung is clear.  The cardiac silhouette, mediastinum, pulmonary vasculature are within normal limits.  IMPRESSION: Stable chest x-ray with no evidence of acute cardiac or pulmonary process.  Original Report Authenticated By: Brandon Melnick, M.D.   Dg Chest Port 1 View  09/26/2011  *RADIOLOGY REPORT*  Clinical Data: Shortness of breath.  PORTABLE CHEST - 1 VIEW 09/26/2011 0733 hours:  Comparison: Two-view chest x-ray 08/04/2011, 06/20/2011, 04/16/2011 [name] and 04/04/2008 [facility].  Findings: Respiratory motion blurs the image.  The patient's chin overlies the left lung apex.  Cardiac silhouette mildly enlarged for the AP portable technique, unchanged. Stable chronic elevation of the right hemidiaphragm and chronic scar/atelectasis at the right base.  Lungs otherwise clear.  Pulmonary vascularity normal. No visible pleural effusions.  Moderate sized hiatal hernia.  IMPRESSION: Stable mild cardiomegaly.  No acute cardiopulmonary disease. Stable chronic elevation of the right hemidiaphragm and associated chronic scar/atelectasis at the right base.  Hiatal hernia.  Original  Report Authenticated By: Arnell Sieving, M.D.   Dg Abd Portable 1v  09/26/2011  *RADIOLOGY REPORT*  Clinical Data: Vomiting.  Diarrhea.  PORTABLE ABDOMEN - 1 VIEW  Comparison: 07/05/2011 CT.  Findings: Single supine view of the abdomen and pelvis.  Minimal convex left lumbar spine curvature.  Mild osteopenia.  Numerous wires project over the abdomen.  Gas within nondistended large and small bowel.  No pneumatosis or definite free intraperitoneal air. Phleboliths in the pelvis. No abnormal abdominal calcifications. No appendicolith.  Minimal distal gas.  IMPRESSION: No acute findings.  Original Report Authenticated By: Consuello Bossier, M.D.    Scheduled Meds:   . amLODipine  10 mg Oral Daily  . cloNIDine  0.1 mg Oral BID  . famotidine  20 mg Oral BID  . levothyroxine  50 mcg Oral Q breakfast  . montelukast  10 mg Oral QHS  . oseltamivir  75 mg Oral Daily  . pentosan polysulfate  100 mg Oral TID AC  . QUEtiapine  25 mg Oral QHS  . rOPINIRole  1-2 mg Oral BID  . DISCONTD: oseltamivir  75  mg Oral BID   Continuous Infusions:  PRN Meds:.albuterol, guaiFENesin-dextromethorphan, HYDROcodone-acetaminophen, LORazepam, ondansetron (ZOFRAN) IV, ondansetron  Assessment & Plan   1. High fevers, shortness of breath, recent gastroenteritis like picture likely all due to Influ +ve infection - No SOB, CXR stable, on Tamiflu, diarrhea resolved, feels better.  2. ARF on CKD 3 -due to #1 above, will hold ACE inhibitor, Improved, baseline 1.3 to 1.5  3. History of abdominal mass questionable bowel obstruction in the past, have repeated a KUB which appears unremarkable-seen by GI, symptom free, outpt follow.  4. H/O hypothyroidism- stable TSH and continue home dose Synthroid.   Lab Results  Component Value Date   TSH 2.014 09/27/2011     6.H/O hypertension - now, <3 second pauses on tele, symptom free,  D/W cards Dr Riley Kill, no significance of pauses <3 secs, will Dc Clonidine too, Coreg stopped, BP  stable. Norvasc and hydralazine now.  PT-OT for placement  DVT Prophylaxis  SCDs  See all Orders from today for further details     Leroy Sea M.D on 09/28/2011 at 2:47 PM  [facility] Office  [phone]

## 2011-09-28 NOTE — Progress Notes (Signed)
Clinical social worker aware of potential disposition plans. CSW awaiting pt/ot evaluation.   Catha Gosselin, [place]  [phone] .09/28/2011 17:53pm

## 2011-09-28 NOTE — Progress Notes (Signed)
Pharmacy - Renal Dose Adjustment  78 yo F presented to ED with n/v/d, weakness, Tm 103.9.  Pt recently completed outpt course of Levaquin for URI.  Found to have +Flu PCR and started on Tamiflu.  Noted hx of CRI, SCr 1.73, CrCl ~20.    Will adjust Tamiflu to 75mg  once daily (this is appropriate treatment dosing for patient's with CrCl <30).  [employer], Pharm.D., BCPS Clinical Pharmacist Pager [phone]

## 2011-09-28 NOTE — Progress Notes (Signed)
Subjective: Pt eating clears denies abd pain, nausea or diarrhea  Objective: Vital signs in last 24 hours: Temp:  [97.7 F (36.5 C)-98.7 F (37.1 C)] 98.7 F (37.1 C) (01/04 0441) Pulse Rate:  [59-66] 59  (01/04 0441) Resp:  [18-20] 18  (01/04 0441) BP: (147-178)/(60-72) 162/61 mmHg (01/04 0441) SpO2:  [92 %-96 %] 94 % (01/04 0441) Last BM Date: 09/26/11  Intake/Output from previous day: 01/03 0701 - 01/04 0700 In: 328 [P.O.:328] Out: 450 [Urine:450] Intake/Output this shift:    ZO:XWRUE and oriented, eating breakfast  Abd--soft, nontender  Lab Results:  Basename 09/27/11 0100 09/26/11 0630  WBC 6.7 10.9*  HGB 12.8 16.2*  HCT 40.9 49.2*  PLT 128* PLATELET CLUMPS NOTED ON SMEAR, COUNT APPEARS ADEQUATE   BMET  Basename 09/28/11 0510 09/27/11 0030 09/26/11 0632  NA 132* 135 135  K 4.2 4.1 4.2  CL 98 103 97  CO2 22 19 20   CREATININE 1.73* 1.79* 1.91*   LFT  Basename 09/26/11 0632  PROT 8.9*  AST 124*  ALT 45*  ALKPHOS 65  BILITOT 0.4  BILIDIR --  IBILI --   PT/INR  Basename 09/26/11 1755  LABPROT 14.9  INR 1.15   Hepatitis Panel No results found for this basename: HEPBSAG,HCVAB,HEPAIGM,HEPBIGM in the last 72 hours     Studies/Results: Portable Chest 1 View  09/27/2011  *RADIOLOGY REPORT*  Clinical Data: Cough  PORTABLE CHEST - 1 VIEW  Comparison: September 25, 2037  Findings: Elevation of the right hemi diaphragm and right basilar atelectasis are unchanged.  The left lung is clear.  The cardiac silhouette, mediastinum, pulmonary vasculature are within normal limits.  IMPRESSION: Stable chest x-ray with no evidence of acute cardiac or pulmonary process.  Original Report Authenticated By: Brandon Melnick, M.D.   Dg Abd Portable 1v  09/26/2011  *RADIOLOGY REPORT*  Clinical Data: Vomiting.  Diarrhea.  PORTABLE ABDOMEN - 1 VIEW  Comparison: 07/05/2011 CT.  Findings: Single supine view of the abdomen and pelvis.  Minimal convex left lumbar spine curvature.  Mild  osteopenia.  Numerous wires project over the abdomen.  Gas within nondistended large and small bowel.  No pneumatosis or definite free intraperitoneal air. Phleboliths in the pelvis. No abnormal abdominal calcifications. No appendicolith.  Minimal distal gas.  IMPRESSION: No acute findings.  Original Report Authenticated By: Consuello Bossier, M.D.    Medications: I have reviewed the patient's current medications.  Assessment/Plan: 1. N+V/Diarrhea.  Resolved probably infectious.  Will advance diet and should be OK to discharge with OP follow-up.   Laurana Magistro JR,Hisao Doo L 09/28/2011, 8:44 AM

## 2011-09-29 ENCOUNTER — Other Ambulatory Visit: Payer: Self-pay

## 2011-09-29 LAB — BASIC METABOLIC PANEL
BUN: 53 mg/dL — ABNORMAL HIGH (ref 6–23)
Calcium: 8.3 mg/dL — ABNORMAL LOW (ref 8.4–10.5)
GFR calc non Af Amer: 22 mL/min — ABNORMAL LOW (ref >90)
Glucose, Bld: 110 mg/dL — ABNORMAL HIGH (ref 70–99)

## 2011-09-29 LAB — CARDIAC PANEL(CRET KIN+CKTOT+MB+TROPI)
CK, MB: 4 ng/mL (ref 0.3–4.0)
Relative Index: 1 (ref 0.0–2.5)

## 2011-09-29 LAB — FECAL LACTOFERRIN, QUANT

## 2011-09-29 MED ORDER — MAGNESIUM SULFATE 40 MG/ML IJ SOLN
2.0000 g | Freq: Once | INTRAMUSCULAR | Status: AC
  Administered 2011-09-29: 2 g via INTRAVENOUS
  Filled 2011-09-29: qty 50

## 2011-09-29 MED ORDER — ASPIRIN EC 81 MG PO TBEC
81.0000 mg | DELAYED_RELEASE_TABLET | Freq: Every day | ORAL | Status: DC
  Filled 2011-09-29: qty 1

## 2011-09-29 MED ORDER — FAMOTIDINE 20 MG PO TABS
20.0000 mg | ORAL_TABLET | Freq: Every day | ORAL | Status: DC
  Administered 2011-09-30 – 2011-10-02 (×3): 20 mg via ORAL
  Filled 2011-09-29 (×3): qty 1

## 2011-09-29 NOTE — Progress Notes (Signed)
Patient had 5 runs of grouped PVC's/V'tach all averaging 5-6 runs each time.  Started at 1145 and stopped at 1200. Asymptomatic was asleep. Dr. Thedore Mins text paged. Will continue to monitor.  Trai Ells Consuella Lose 09/29/2011  12:10 PM

## 2011-09-29 NOTE — Progress Notes (Signed)
Leslie [name] CSN:[id number],MRN:[id number] is a 78 y.o. female,  Outpatient Primary MD for the patient is No primary provider on file.  Chief Complaint  Patient presents with  . Diarrhea  . Emesis  . Fall  . Cough        Subjective:   Leslie [name] today has, No headache, No chest pain, No abdominal pain - No Nausea, No new weakness tingling or numbness, No Cough - SOB.    Objective:   Filed Vitals:   09/28/11 1359 09/28/11 1906 09/28/11 2138 09/29/11 0626  BP: 120/69 124/59 144/64 129/59  Pulse: 59  54 48  Temp: 98 F (36.7 C)  97.4 F (36.3 C) 97.2 F (36.2 C)  TempSrc: Oral  Oral Oral  Resp: 20  18 18   Height:      Weight:    62.1 kg (136 lb 14.5 oz)  SpO2: 94%  90% 93%    Wt Readings from Last 3 Encounters:  09/29/11 62.1 kg (136 lb 14.5 oz)     Intake/Output Summary (Last 24 hours) at 09/29/11 1244 Last data filed at 09/29/11 0944  Gross per 24 hour  Intake    680 ml  Output    100 ml  Net    580 ml    Exam Awake Alert, Oriented *3, No new F.N deficits, Normal affect .AT,PERRAL Supple Neck,No JVD, No cervical lymphadenopathy appriciated.  Symmetrical Chest wall movement, Good air movement bilaterally, CTAB RRR,No Gallops,Rubs or new Murmurs, No Parasternal Heave +ve B.Sounds, Abd Soft, Non tender, No organomegaly appriciated, No rebound -guarding or rigidity. No Cyanosis, Clubbing or edema, No new Rash or bruise     Data Review  CBC  Lab 09/27/11 0100 09/26/11 0630  WBC 6.7 10.9*  HGB 12.8 16.2*  HCT 40.9 49.2*  PLT 128* PLATELET CLUMPS NOTED ON SMEAR, COUNT APPEARS ADEQUATE  MCV 89.9 89.6  MCH 28.1 29.5  MCHC 31.3 32.9  RDW 14.5 14.4  LYMPHSABS -- 1.7  MONOABS -- 0.9  EOSABS -- 0.0  BASOSABS -- 0.0  BANDABS -- --    Chemistries   Lab 09/28/11 0510 09/27/11 0030 09/26/11 0632  NA 132* 135 135  K 4.2 4.1 4.2  CL 98 103 97  CO2 22 19 20   GLUCOSE 89 97 109*  BUN 40* 37* 29*  CREATININE 1.73* 1.79* 1.91*  CALCIUM 8.5 8.3* 9.4  MG --  2.2 --  AST -- -- 124*  ALT -- -- 45*  ALKPHOS -- -- 65  BILITOT -- -- 0.4   ------------------------------------------------------------------------------------------------------------------ estimated creatinine clearance is 19 ml/min (by C-G formula based on Cr of 1.73). ------------------------------------------------------------------------------------------------------------------ No results found for this basename: HGBA1C:2 in the last 72 hours ------------------------------------------------------------------------------------------------------------------ No results found for this basename: CHOL:2,HDL:2,LDLCALC:2,TRIG:2,CHOLHDL:2,LDLDIRECT:2 in the last 72 hours ------------------------------------------------------------------------------------------------------------------  Basename 09/27/11 0030  TSH 2.014  T4TOTAL --  T3FREE --  THYROIDAB --   ------------------------------------------------------------------------------------------------------------------ No results found for this basename: VITAMINB12:2,FOLATE:2,FERRITIN:2,TIBC:2,IRON:2,RETICCTPCT:2 in the last 72 hours  Coagulation profile  Lab 09/26/11 1755  INR 1.15  PROTIME --    No results found for this basename: DDIMER:2 in the last 72 hours  Cardiac Enzymes  Lab 09/26/11 0937  CKMB 5.9*  TROPONINI <0.30  MYOGLOBIN --   ------------------------------------------------------------------------------------------------------------------ No components found with this basename: POCBNP:3  Micro Results Recent Results (from the past 240 hour(s))  CULTURE, BLOOD (ROUTINE X 2)     Status: Normal (Preliminary result)   Collection Time   09/26/11 10:50 AM      Component  Value Range Status Comment   Specimen Description BLOOD RIGHT ARM   Final    Special Requests BOTTLES DRAWN AEROBIC AND ANAEROBIC 10CC   Final    Setup Time [id number]   Final    Culture     Final    Value:        BLOOD CULTURE RECEIVED NO  GROWTH TO DATE CULTURE WILL BE HELD FOR 5 DAYS BEFORE ISSUING A FINAL NEGATIVE REPORT   Report Status PENDING   Incomplete   CULTURE, BLOOD (ROUTINE X 2)     Status: Normal (Preliminary result)   Collection Time   09/26/11 10:55 AM      Component Value Range Status Comment   Specimen Description BLOOD RIGHT ARM   Final    Special Requests BOTTLES DRAWN AEROBIC ONLY 5CC   Final    Setup Time [id number]   Final    Culture     Final    Value:        BLOOD CULTURE RECEIVED NO GROWTH TO DATE CULTURE WILL BE HELD FOR 5 DAYS BEFORE ISSUING A FINAL NEGATIVE REPORT   Report Status PENDING   Incomplete   CLOSTRIDIUM DIFFICILE BY PCR     Status: Normal   Collection Time   09/28/11  6:27 PM      Component Value Range Status Comment   C difficile by pcr NEGATIVE  NEGATIVE  Final     Radiology Reports Portable Chest 1 View  09/27/2011  *RADIOLOGY REPORT*  Clinical Data: Cough  PORTABLE CHEST - 1 VIEW  Comparison: September 25, 2037  Findings: Elevation of the right hemi diaphragm and right basilar atelectasis are unchanged.  The left lung is clear.  The cardiac silhouette, mediastinum, pulmonary vasculature are within normal limits.  IMPRESSION: Stable chest x-ray with no evidence of acute cardiac or pulmonary process.  Original Report Authenticated By: Brandon Melnick, M.D.   Dg Chest Port 1 View  09/26/2011  *RADIOLOGY REPORT*  Clinical Data: Shortness of breath.  PORTABLE CHEST - 1 VIEW 09/26/2011 0733 hours:  Comparison: Two-view chest x-ray 08/04/2011, 06/20/2011, 04/16/2011 [name] and 04/04/2008 [name].  Findings: Respiratory motion blurs the image.  The patient's chin overlies the left lung apex.  Cardiac silhouette mildly enlarged for the AP portable technique, unchanged. Stable chronic elevation of the right hemidiaphragm and chronic scar/atelectasis at the right base.  Lungs otherwise clear.  Pulmonary vascularity normal. No visible pleural effusions.  Moderate sized hiatal hernia.   IMPRESSION: Stable mild cardiomegaly.  No acute cardiopulmonary disease. Stable chronic elevation of the right hemidiaphragm and associated chronic scar/atelectasis at the right base.  Hiatal hernia.  Original Report Authenticated By: Arnell Sieving, M.D.   Dg Abd Portable 1v  09/26/2011  *RADIOLOGY REPORT*  Clinical Data: Vomiting.  Diarrhea.  PORTABLE ABDOMEN - 1 VIEW  Comparison: 07/05/2011 CT.  Findings: Single supine view of the abdomen and pelvis.  Minimal convex left lumbar spine curvature.  Mild osteopenia.  Numerous wires project over the abdomen.  Gas within nondistended large and small bowel.  No pneumatosis or definite free intraperitoneal air. Phleboliths in the pelvis. No abnormal abdominal calcifications. No appendicolith.  Minimal distal gas.  IMPRESSION: No acute findings.  Original Report Authenticated By: Consuello Bossier, M.D.    Scheduled Meds:    . amLODipine  10 mg Oral Daily  . famotidine  20 mg Oral BID  . hydrALAZINE  25 mg Oral Q6H  . levothyroxine  50 mcg Oral  Q breakfast  . magnesium sulfate 1 - 4 g bolus IVPB  2 g Intravenous Once  . montelukast  10 mg Oral QHS  . oseltamivir  75 mg Oral Daily  . pentosan polysulfate  100 mg Oral TID AC  . QUEtiapine  25 mg Oral QHS  . rOPINIRole  1-2 mg Oral BID  . DISCONTD: cloNIDine  0.1 mg Oral BID   Continuous Infusions:  PRN Meds:.albuterol, guaiFENesin-dextromethorphan, HYDROcodone-acetaminophen, LORazepam, ondansetron (ZOFRAN) IV, ondansetron  Assessment & Plan   1. High fevers, shortness of breath, recent gastroenteritis like picture likely all due to Influ +ve infection - No SOB, CXR stable, complete Tamiflu, diarrhea resolved, looks and feels better.  2. ARF on CKD 3 -due to #1 above, will hold ACE inhibitor, Improved, baseline 1.3 to 1.5  3. History of abdominal mass questionable bowel obstruction in the past, have repeated a KUB which appears unremarkable-seen by GI, symptom free, outpt follow.  4. H/O  hypothyroidism- stable TSH and continue home dose Synthroid.   Lab Results  Component Value Date   TSH 2.014 09/27/2011     5.H/O hypertension - now, <3 second pauses on tele, symptom free,  D/W cards Dr Riley Kill, no significance of pauses <3 secs, will Dc Clonidine too, Coreg stopped, BP stable. Norvasc and hydralazine now.  6. Few NSVTs 5-6 beats X 3 - symptom few, D/W daughter bedside, Med Rx only, unable to tolerate B blocker due to Bradycardia- will check lytes, IV Mag now, EKG-Enzymes and Echo. No surgical intervention per daughter.   PT-OT for placement  DVT Prophylaxis  SCDs  See all Orders from today for further details     Leroy Sea M.D on 09/29/2011 at 12:44 PM  [facility] Office  [phone]

## 2011-09-30 LAB — CARDIAC PANEL(CRET KIN+CKTOT+MB+TROPI)
CK, MB: 4 ng/mL (ref 0.3–4.0)
Total CK: 347 U/L — ABNORMAL HIGH (ref 7–177)
Total CK: 299 U/L — ABNORMAL HIGH (ref 7–177)

## 2011-09-30 MED ORDER — MAGNESIUM OXIDE 400 MG PO TABS
200.0000 mg | ORAL_TABLET | Freq: Every day | ORAL | Status: DC
  Administered 2011-09-30: 17:00:00 via ORAL
  Administered 2011-10-01 – 2011-10-02 (×2): 200 mg via ORAL
  Filled 2011-09-30 (×4): qty 0.5

## 2011-09-30 NOTE — Progress Notes (Signed)
Physical Therapy Evaluation Patient Details Name: Leslie [name] MRN: [id number] DOB: Jan 06, 1922 Today's Date: 09/30/2011  Problem List:  Patient Active Problem List  Diagnoses  . Gastroenteritis  . Fever  . Weakness generalized  . SOB (shortness of breath)  . Hypothyroidism  . Bowel obstruction  . Hypertension    Past Medical History:  Past Medical History  Diagnosis Date  . Hypertension   . Bowel obstruction 1987  . Hypothyroidism   . Interstitial cystitis   . Hypercholesteremia   . Headache   . Anxiety   . Depression   . Multiple respiratory allergies   . Restless leg syndrome   . Stroke 06/2002    right parietal nonhemorrhagic acute infarction  . Abdominal mass   . Fibromyalgia   . Parkinson's disease     dx'd 1990's; told she doesn't have it ~ 2000  . Bradycardia, sinus    Past Surgical History:  Past Surgical History  Procedure Date  . Abdominal surgery 1970's    "for blockage"  . Cystoscopy with urethral dilatation 02/2006; 07/2005; 01/2005  . Cholecystectomy   . Tonsillectomy   . Appendectomy   . Abdominal hysterectomy   . Dilation and curettage of uterus 1940's  . Shoulder surgery 1970's    "for fx"    PT Assessment/Plan/Recommendation PT Assessment Clinical Impression Statement: 78 yo female admitted with diarrhea, emesis, fall, cough presents with decr functional independence and impairments listed below; will benefit from PT to maximize independence and safety with mobility, amb, and facilitate dc planning in prep for dc to SNF PT Recommendation/Assessment: Patient will need skilled PT in the acute care venue Barriers to Discharge: None PT Therapy Diagnosis : Difficulty walking;Generalized weakness PT Plan PT Frequency: Min 3X/week PT Treatment/Interventions: DME instruction;Gait training;Stair training;Functional mobility training;Therapeutic activities;Therapeutic exercise;Balance training;Patient/family education PT  Recommendation Recommendations for Other Services: OT consult Follow Up Recommendations: Skilled nursing facility Equipment Recommended: Defer to next venue PT Goals  Acute Rehab PT Goals PT Goal Formulation: With patient Time For Goal Achievement: 2 weeks Pt will go Supine/Side to Sit: with modified independence Pt will go Sit to Supine/Side: with modified independence Pt will go Sit to Stand: with modified independence Pt will go Stand to Sit: with modified independence Pt will Transfer Bed to Chair/Chair to Bed: with modified independence Pt will Ambulate: >150 feet;with modified independence;with least restrictive assistive device  PT Evaluation Precautions/Restrictions  Precautions Precautions: Fall Prior Functioning  Home Living Lives With: Alone (Independent living apt; Daughter checks in on pt) Receives Help From:  (daughter local) Type of Home: Apartment (planning to dc to SNF) Home Layout: One level Home Access: Level entry;Elevator Home Adaptive Equipment: Walker - rolling Additional Comments: Pt states she manages independently in her apartment, which has an efficiency kitchen Prior Function Level of Independence: Independent with basic ADLs Driving: No Cognition Cognition Arousal/Alertness: Awake/alert Overall Cognitive Status: Appears within functional limits for tasks assessed Sensation/Coordination Sensation Light Touch: Appears Intact Coordination Gross Motor Movements are Fluid and Coordinated: No Fine Motor Movements are Fluid and Coordinated: Not tested Coordination and Movement Description: slow moving Extremity Assessment RUE Assessment RUE Assessment: Exceptions to [facility] RUE Strength RUE Overall Strength Comments: pt with previous Right shoulder surgeries; unable to flex or abduct shoulder to horizontal LUE Assessment LUE Assessment: Within Functional Limits RLE Assessment RLE Assessment: Exceptions to [facility] RLE Strength RLE Overall Strength  Comments: generalized weakness noted with dependence on UE assist for sit to and from stand LLE Assessment LLE Assessment: Exceptions  to [facility] LLE Strength LLE Overall Strength Comments: generalized weakness noted with dependence on UE assist for sit to and from stand Mobility (including Balance) Bed Mobility Bed Mobility: Yes Supine to Sit: 3: Mod assist Supine to Sit Details (indicate cue type and reason): cues for technique Sitting - Scoot to Edge of Bed: 3: Mod assist Sitting - Scoot to Edge of Bed Details (indicate cue type and reason): cues for technique Transfers Transfers: Yes Sit to Stand: 3: Mod assist;With upper extremity assist;From bed;From toilet Sit to Stand Details (indicate cue type and reason): required physical assist to steady RW as pt pulled up on RW; cues for safe transfer Stand to Sit: 4: Min assist (without physical contact) Stand to Sit Details: cues for control and safe transfer Ambulation/Gait Ambulation/Gait: Yes Ambulation/Gait Assistance: 4: Min assist Ambulation/Gait Assistance Details (indicate cue type and reason): cues for posture; very slow moving Ambulation Distance (Feet): 20 Feet Assistive device: Rolling walker Gait Pattern: Step-to pattern       End of Session PT - End of Session Activity Tolerance: Patient tolerated treatment well Patient left: in chair;with call bell in reach Nurse Communication: Mobility status for transfers;Mobility status for ambulation General Behavior During Session: [facility] for tasks performed Cognition: [facility] for tasks performed  Van Clines [facility] [place], [place] [phone]  09/30/2011, 12:52 PM

## 2011-09-30 NOTE — Progress Notes (Signed)
Hanya Guerin CSN:[id number],MRN:[id number] is a 78 y.o. female,  Outpatient Primary MD for the patient is No primary provider on file.  Chief Complaint  Patient presents with  . Diarrhea  . Emesis  . Fall  . Cough        Subjective:   [name] today has, No headache, No chest pain, No abdominal pain - No Nausea, No new weakness tingling or numbness, No Cough - SOB.    Objective:   Filed Vitals:   09/29/11 0626 09/29/11 1500 09/29/11 2116 09/30/11 0500  BP: 129/59 124/52 156/55 149/75  Pulse: 48 46 55 56  Temp: 97.2 F (36.2 C) 97.9 F (36.6 C) 97.4 F (36.3 C) 97.4 F (36.3 C)  TempSrc: Oral Oral Oral Oral  Resp: 18 18 20 20   Height:      Weight: 62.1 kg (136 lb 14.5 oz)   65.9 kg (145 lb 4.5 oz)  SpO2: 93% 90% 92% 94%    Wt Readings from Last 3 Encounters:  09/30/11 65.9 kg (145 lb 4.5 oz)     Intake/Output Summary (Last 24 hours) at 09/30/11 1324 Last data filed at 09/30/11 0918  Gross per 24 hour  Intake    270 ml  Output      0 ml  Net    270 ml    Exam Awake Alert, Oriented *3, No new F.N deficits, Normal affect [place].AT,PERRAL Supple Neck,No JVD, No cervical lymphadenopathy appriciated.  Symmetrical Chest wall movement, Good air movement bilaterally, CTAB RRR,No Gallops,Rubs or new Murmurs, No Parasternal Heave +ve B.Sounds, Abd Soft, Non tender, No organomegaly appriciated, No rebound -guarding or rigidity. No Cyanosis, Clubbing or edema, No new Rash or bruise     Data Review  CBC  Lab 09/27/11 0100 09/26/11 0630  WBC 6.7 10.9*  HGB 12.8 16.2*  HCT 40.9 49.2*  PLT 128* PLATELET CLUMPS NOTED ON SMEAR, COUNT APPEARS ADEQUATE  MCV 89.9 89.6  MCH 28.1 29.5  MCHC 31.3 32.9  RDW 14.5 14.4  LYMPHSABS -- 1.7  MONOABS -- 0.9  EOSABS -- 0.0  BASOSABS -- 0.0  BANDABS -- --    Chemistries   Lab 09/29/11 1332 09/28/11 0510 09/27/11 0030 09/26/11 0632  NA 133* 132* 135 135  K 4.4 4.2 4.1 4.2  CL 101 98 103 97  CO2 19 22 19 20   GLUCOSE 110* 89  97 109*  BUN 53* 40* 37* 29*  CREATININE 1.93* 1.73* 1.79* 1.91*  CALCIUM 8.3* 8.5 8.3* 9.4  MG 3.1* -- 2.2 --  AST -- -- -- 124*  ALT -- -- -- 45*  ALKPHOS -- -- -- 65  BILITOT -- -- -- 0.4   ------------------------------------------------------------------------------------------------------------------ estimated creatinine clearance is 18.7 ml/min (by C-G formula based on Cr of 1.93). ------------------------------------------------------------------------------------------------------------------ No results found for this basename: HGBA1C:2 in the last 72 hours ------------------------------------------------------------------------------------------------------------------ No results found for this basename: CHOL:2,HDL:2,LDLCALC:2,TRIG:2,CHOLHDL:2,LDLDIRECT:2 in the last 72 hours ------------------------------------------------------------------------------------------------------------------ No results found for this basename: TSH,T4TOTAL,FREET3,T3FREE,THYROIDAB in the last 72 hours ------------------------------------------------------------------------------------------------------------------ No results found for this basename: VITAMINB12:2,FOLATE:2,FERRITIN:2,TIBC:2,IRON:2,RETICCTPCT:2 in the last 72 hours  Coagulation profile  Lab 09/26/11 1755  INR 1.15  PROTIME --    No results found for this basename: DDIMER:2 in the last 72 hours  Cardiac Enzymes  Lab 09/30/11 0735 09/29/11 2317 09/29/11 1332  CKMB 4.3* 4.0 4.0  TROPONINI <0.30 <0.30 <0.30  MYOGLOBIN -- -- --   ------------------------------------------------------------------------------------------------------------------ No components found with this basename: POCBNP:3  Micro Results Recent Results (from the past 240 hour(s))  CULTURE, BLOOD (ROUTINE X 2)     Status: Normal (Preliminary result)   Collection Time   09/26/11 10:50 AM      Component Value Range Status Comment   Specimen Description BLOOD  RIGHT ARM   Final    Special Requests BOTTLES DRAWN AEROBIC AND ANAEROBIC 10CC   Final    Setup Time [id number]   Final    Culture     Final    Value:        BLOOD CULTURE RECEIVED NO GROWTH TO DATE CULTURE WILL BE HELD FOR 5 DAYS BEFORE ISSUING A FINAL NEGATIVE REPORT   Report Status PENDING   Incomplete   CULTURE, BLOOD (ROUTINE X 2)     Status: Normal (Preliminary result)   Collection Time   09/26/11 10:55 AM      Component Value Range Status Comment   Specimen Description BLOOD RIGHT ARM   Final    Special Requests BOTTLES DRAWN AEROBIC ONLY 5CC   Final    Setup Time [id number]   Final    Culture     Final    Value:        BLOOD CULTURE RECEIVED NO GROWTH TO DATE CULTURE WILL BE HELD FOR 5 DAYS BEFORE ISSUING A FINAL NEGATIVE REPORT   Report Status PENDING   Incomplete   CLOSTRIDIUM DIFFICILE BY PCR     Status: Normal   Collection Time   09/28/11  6:27 PM      Component Value Range Status Comment   C difficile by pcr NEGATIVE  NEGATIVE  Final     Radiology Reports Portable Chest 1 View  09/27/2011  *RADIOLOGY REPORT*  Clinical Data: Cough  PORTABLE CHEST - 1 VIEW  Comparison: September 25, 2037  Findings: Elevation of the right hemi diaphragm and right basilar atelectasis are unchanged.  The left lung is clear.  The cardiac silhouette, mediastinum, pulmonary vasculature are within normal limits.  IMPRESSION: Stable chest x-ray with no evidence of acute cardiac or pulmonary process.  Original Report Authenticated By: Brandon Melnick, M.D.   Dg Chest Port 1 View  09/26/2011  *RADIOLOGY REPORT*  Clinical Data: Shortness of breath.  PORTABLE CHEST - 1 VIEW 09/26/2011 0733 hours:  Comparison: Two-view chest x-ray 08/04/2011, 06/20/2011, 04/16/2011 [name] and 04/04/2008 [facility].  Findings: Respiratory motion blurs the image.  The patient's chin overlies the left lung apex.  Cardiac silhouette mildly enlarged for the AP portable technique, unchanged. Stable chronic elevation of the  right hemidiaphragm and chronic scar/atelectasis at the right base.  Lungs otherwise clear.  Pulmonary vascularity normal. No visible pleural effusions.  Moderate sized hiatal hernia.  IMPRESSION: Stable mild cardiomegaly.  No acute cardiopulmonary disease. Stable chronic elevation of the right hemidiaphragm and associated chronic scar/atelectasis at the right base.  Hiatal hernia.  Original Report Authenticated By: Arnell Sieving, M.D.   Dg Abd Portable 1v  09/26/2011  *RADIOLOGY REPORT*  Clinical Data: Vomiting.  Diarrhea.  PORTABLE ABDOMEN - 1 VIEW  Comparison: 07/05/2011 CT.  Findings: Single supine view of the abdomen and pelvis.  Minimal convex left lumbar spine curvature.  Mild osteopenia.  Numerous wires project over the abdomen.  Gas within nondistended large and small bowel.  No pneumatosis or definite free intraperitoneal air. Phleboliths in the pelvis. No abnormal abdominal calcifications. No appendicolith.  Minimal distal gas.  IMPRESSION: No acute findings.  Original Report Authenticated By: Consuello Bossier, M.D.    Scheduled Meds:    . amLODipine  10 mg Oral Daily  . famotidine  20 mg Oral Daily  . hydrALAZINE  25 mg Oral Q6H  . levothyroxine  50 mcg Oral Q breakfast  . magnesium sulfate 1 - 4 g bolus IVPB  2 g Intravenous Once  . montelukast  10 mg Oral QHS  . oseltamivir  75 mg Oral Daily  . pentosan polysulfate  100 mg Oral TID AC  . QUEtiapine  25 mg Oral QHS  . rOPINIRole  1-2 mg Oral BID  . DISCONTD: aspirin EC  81 mg Oral Daily   Continuous Infusions:  PRN Meds:.albuterol, guaiFENesin-dextromethorphan, HYDROcodone-acetaminophen, LORazepam, ondansetron (ZOFRAN) IV, ondansetron  Assessment & Plan   1. High fevers, shortness of breath, recent gastroenteritis like picture likely all due to Influ +ve infection - No SOB, CXR stable, complete Tamiflu, diarrhea resolved, looks and feels better.  2. ARF on CKD 3 -due to #1 above, will hold ACE inhibitor, Improved, baseline  1.3 to 1.5  3. History of abdominal mass questionable bowel obstruction in the past, have repeated a KUB which appears unremarkable-seen by GI, symptom free, outpt follow.  4. H/O hypothyroidism- stable TSH and continue home dose Synthroid.   Lab Results  Component Value Date   TSH 2.014 09/27/2011     5.H/O hypertension - now, <3 second pauses on tele, symptom free,  D/W cards Dr Riley Kill, no significance of pauses <3 secs, will Dc Clonidine too, Coreg stopped, BP stable. Norvasc and hydralazine now. Stable.   6. Few NSVTs 5-6 beats X 3 on 09-29-11 - symptom free, D/W daughter bedside, Med Rx only, unable to tolerate B blocker due to Bradycardia & ASA due to allergy- stable lytes, post IV Mag  , EKG-Enzymes stable, pending  Echo. No surgical intervention per daughter. No further NSVT since 09-29-11.    PT-OT for placement  DVT Prophylaxis  SCDs  See all Orders from today for further details     Leroy Sea M.D on 09/30/2011 at 1:24 PM  [facility] Office  [phone]

## 2011-10-01 LAB — GIARDIA/CRYPTOSPORIDIUM SCREEN(EIA)
Cryptosporidium Screen (EIA): NEGATIVE
Giardia Screen - EIA: NEGATIVE

## 2011-10-01 NOTE — Progress Notes (Addendum)
Occupational Therapy Evaluation Patient Details Name: Leslie [name] MRN: [id number] DOB: 06-18-1922 Today's Date: 10/01/2011 10:48-11:18  evII Problem List:  Patient Active Problem List  Diagnoses  . Gastroenteritis  . Fever  . Weakness generalized  . SOB (shortness of breath)  . Hypothyroidism  . Bowel obstruction  . Hypertension    Past Medical History:  Past Medical History  Diagnosis Date  . Hypertension   . Bowel obstruction 1987  . Hypothyroidism   . Interstitial cystitis   . Hypercholesteremia   . Headache   . Anxiety   . Depression   . Multiple respiratory allergies   . Restless leg syndrome   . Stroke 06/2002    right parietal nonhemorrhagic acute infarction  . Abdominal mass   . Fibromyalgia   . Parkinson's disease     dx'd 1990's; told she doesn't have it ~ 2000  . Bradycardia, sinus    Past Surgical History:  Past Surgical History  Procedure Date  . Abdominal surgery 1970's    "for blockage"  . Cystoscopy with urethral dilatation 02/2006; 07/2005; 01/2005  . Cholecystectomy   . Tonsillectomy   . Appendectomy   . Abdominal hysterectomy   . Dilation and curettage of uterus 1940's  . Shoulder surgery 1970's    "for fx"    OT Assessment/Plan/Recommendation OT Assessment Clinical Impression Statement: Pleasant 78 year old that presents with increased weakness and decreased endurance and balance needed for basic selfcare independence.  Feel pt will benefit from acute OT services to address these issues to help her regain independence with ADLs.  Feel pt will need SNF rehab after hospitalization to achieve modified independent status.   OT Recommendation/Assessment: Patient will need skilled OT in the acute care venue OT Problem List: Decreased strength;Decreased activity tolerance;Impaired balance (sitting and/or standing);Decreased knowledge of use of DME or AE Barriers to Discharge: Decreased caregiver support OT Therapy Diagnosis : Generalized  weakness OT Plan OT Frequency: Min 1X/week OT Treatment/Interventions: Self-care/ADL training;Therapeutic activities;Patient/family education;Balance training OT Recommendation Follow Up Recommendations: Skilled nursing facility Equipment Recommended: Defer to next venue Individuals Consulted Consulted and Agree with Results and Recommendations: Patient OT Goals Acute Rehab OT Goals OT Goal Formulation: With patient ADL Goals Pt Will Perform Lower Body Bathing: with supervision;Sit to stand from bed ADL Goal: Lower Body Bathing - Progress: Not met Pt Will Perform Lower Body Dressing: with supervision;Sit to stand from bed ADL Goal: Lower Body Dressing - Progress: Not met Pt Will Transfer to Toilet: with supervision;with DME;3-in-1 ADL Goal: Toilet Transfer - Progress: Not met Pt Will Perform Toileting - Clothing Manipulation: with supervision ADL Goal: Toileting - Clothing Manipulation - Progress: Not met Pt Will Perform Toileting - Hygiene: with supervision;Sit to stand from 3-in-1/toilet ADL Goal: Toileting - Hygiene - Progress: Not met  OT Evaluation Precautions/Restrictions  Precautions Precautions: Fall Precaution Comments: droplet Required Braces or Orthoses: No Restrictions Weight Bearing Restrictions: No Prior Functioning     ADL   Vision/Perception    Cognition   Sensation/Coordination   Extremity Assessment   Mobility     End of Session     Nialah Saravia OTR/L 10/01/2011, 4:35 PM  Pager number [phone]

## 2011-10-01 NOTE — Progress Notes (Signed)
  Echocardiogram 2D Echocardiogram has been performed.  Leslie [name] Nira Retort 10/01/2011, 3:47 PM

## 2011-10-01 NOTE — Progress Notes (Signed)
Leslie [name] CSN:[id number],MRN:[id number] is a 78 y.o. female,  Outpatient Primary MD for the patient is No primary provider on file.  Chief Complaint  Patient presents with  . Diarrhea  . Emesis  . Fall  . Cough        Subjective:   Leslie [name] today has, No headache, No chest pain, No abdominal pain - No Nausea, No new weakness tingling or numbness, No Cough - SOB.    Objective:   Filed Vitals:   10/01/11 1048 10/01/11 1115 10/01/11 1118 10/01/11 1201  BP:    142/60  Pulse: 65     Temp:      TempSrc:      Resp:      Height:      Weight:      SpO2: 91% 84% 92%     Wt Readings from Last 3 Encounters:  10/01/11 67.2 kg (148 lb 2.4 oz)     Intake/Output Summary (Last 24 hours) at 10/01/11 1223 Last data filed at 10/01/11 0900  Gross per 24 hour  Intake    600 ml  Output      1 ml  Net    599 ml    Exam Awake Alert, Oriented *3, No new F.N deficits, Normal affect [place].AT,PERRAL Supple Neck,No JVD, No cervical lymphadenopathy appriciated.  Symmetrical Chest wall movement, Good air movement bilaterally, CTAB RRR,No Gallops,Rubs or new Murmurs, No Parasternal Heave +ve B.Sounds, Abd Soft, Non tender, No organomegaly appriciated, No rebound -guarding or rigidity. No Cyanosis, Clubbing or edema, No new Rash or bruise     Data Review  CBC  Lab 09/27/11 0100 09/26/11 0630  WBC 6.7 10.9*  HGB 12.8 16.2*  HCT 40.9 49.2*  PLT 128* PLATELET CLUMPS NOTED ON SMEAR, COUNT APPEARS ADEQUATE  MCV 89.9 89.6  MCH 28.1 29.5  MCHC 31.3 32.9  RDW 14.5 14.4  LYMPHSABS -- 1.7  MONOABS -- 0.9  EOSABS -- 0.0  BASOSABS -- 0.0  BANDABS -- --    Chemistries   Lab 09/29/11 1332 09/28/11 0510 09/27/11 0030 09/26/11 0632  NA 133* 132* 135 135  K 4.4 4.2 4.1 4.2  CL 101 98 103 97  CO2 19 22 19 20   GLUCOSE 110* 89 97 109*  BUN 53* 40* 37* 29*  CREATININE 1.93* 1.73* 1.79* 1.91*  CALCIUM 8.3* 8.5 8.3* 9.4  MG 3.1* -- 2.2 --  AST -- -- -- 124*  ALT -- -- -- 45*  ALKPHOS --  -- -- 65  BILITOT -- -- -- 0.4   ------------------------------------------------------------------------------------------------------------------ estimated creatinine clearance is 18.9 ml/min (by C-G formula based on Cr of 1.93). ------------------------------------------------------------------------------------------------------------------ No results found for this basename: HGBA1C:2 in the last 72 hours ------------------------------------------------------------------------------------------------------------------ No results found for this basename: CHOL:2,HDL:2,LDLCALC:2,TRIG:2,CHOLHDL:2,LDLDIRECT:2 in the last 72 hours ------------------------------------------------------------------------------------------------------------------ No results found for this basename: TSH,T4TOTAL,FREET3,T3FREE,THYROIDAB in the last 72 hours ------------------------------------------------------------------------------------------------------------------ No results found for this basename: VITAMINB12:2,FOLATE:2,FERRITIN:2,TIBC:2,IRON:2,RETICCTPCT:2 in the last 72 hours  Coagulation profile  Lab 09/26/11 1755  INR 1.15  PROTIME --    No results found for this basename: DDIMER:2 in the last 72 hours  Cardiac Enzymes  Lab 09/30/11 0735 09/29/11 2317 09/29/11 1332  CKMB 4.3* 4.0 4.0  TROPONINI <0.30 <0.30 <0.30  MYOGLOBIN -- -- --   ------------------------------------------------------------------------------------------------------------------ No components found with this basename: POCBNP:3  Micro Results Recent Results (from the past 240 hour(s))  CULTURE, BLOOD (ROUTINE X 2)     Status: Normal (Preliminary result)   Collection Time   09/26/11 10:50  AM      Component Value Range Status Comment   Specimen Description BLOOD RIGHT ARM   Final    Special Requests BOTTLES DRAWN AEROBIC AND ANAEROBIC 10CC   Final    Setup Time [id number]   Final    Culture     Final    Value:        BLOOD  CULTURE RECEIVED NO GROWTH TO DATE CULTURE WILL BE HELD FOR 5 DAYS BEFORE ISSUING A FINAL NEGATIVE REPORT   Report Status PENDING   Incomplete   CULTURE, BLOOD (ROUTINE X 2)     Status: Normal (Preliminary result)   Collection Time   09/26/11 10:55 AM      Component Value Range Status Comment   Specimen Description BLOOD RIGHT ARM   Final    Special Requests BOTTLES DRAWN AEROBIC ONLY 5CC   Final    Setup Time [id number]   Final    Culture     Final    Value:        BLOOD CULTURE RECEIVED NO GROWTH TO DATE CULTURE WILL BE HELD FOR 5 DAYS BEFORE ISSUING A FINAL NEGATIVE REPORT   Report Status PENDING   Incomplete   STOOL CULTURE     Status: Normal (Preliminary result)   Collection Time   09/28/11  6:27 PM      Component Value Range Status Comment   Specimen Description STOOL   Final    Special Requests 09/28/11 2050 ADD 09/28/11 2050   Final    Culture     Final    Value: NO SUSPICIOUS COLONIES, CONTINUING TO HOLD     Note: REDUCED NORMAL FLORA PRESENT   Report Status PENDING   Incomplete   CLOSTRIDIUM DIFFICILE BY PCR     Status: Normal   Collection Time   09/28/11  6:27 PM      Component Value Range Status Comment   C difficile by pcr NEGATIVE  NEGATIVE  Final     Radiology Reports Portable Chest 1 View  09/27/2011  *RADIOLOGY REPORT*  Clinical Data: Cough  PORTABLE CHEST - 1 VIEW  Comparison: September 25, 2037  Findings: Elevation of the right hemi diaphragm and right basilar atelectasis are unchanged.  The left lung is clear.  The cardiac silhouette, mediastinum, pulmonary vasculature are within normal limits.  IMPRESSION: Stable chest x-ray with no evidence of acute cardiac or pulmonary process.  Original Report Authenticated By: Brandon Melnick, M.D.   Dg Chest Port 1 View  09/26/2011  *RADIOLOGY REPORT*  Clinical Data: Shortness of breath.  PORTABLE CHEST - 1 VIEW 09/26/2011 0733 hours:  Comparison: Two-view chest x-ray 08/04/2011, 06/20/2011, 04/16/2011 [name] and 04/04/2008 [facility].  Findings: Respiratory motion blurs the image.  The patient's chin overlies the left lung apex.  Cardiac silhouette mildly enlarged for the AP portable technique, unchanged. Stable chronic elevation of the right hemidiaphragm and chronic scar/atelectasis at the right base.  Lungs otherwise clear.  Pulmonary vascularity normal. No visible pleural effusions.  Moderate sized hiatal hernia.  IMPRESSION: Stable mild cardiomegaly.  No acute cardiopulmonary disease. Stable chronic elevation of the right hemidiaphragm and associated chronic scar/atelectasis at the right base.  Hiatal hernia.  Original Report Authenticated By: Arnell Sieving, M.D.   Dg Abd Portable 1v  09/26/2011  *RADIOLOGY REPORT*  Clinical Data: Vomiting.  Diarrhea.  PORTABLE ABDOMEN - 1 VIEW  Comparison: 07/05/2011 CT.  Findings: Single supine view of the abdomen and pelvis.  Minimal convex  left lumbar spine curvature.  Mild osteopenia.  Numerous wires project over the abdomen.  Gas within nondistended large and small bowel.  No pneumatosis or definite free intraperitoneal air. Phleboliths in the pelvis. No abnormal abdominal calcifications. No appendicolith.  Minimal distal gas.  IMPRESSION: No acute findings.  Original Report Authenticated By: Consuello Bossier, M.D.    Scheduled Meds:    . amLODipine  10 mg Oral Daily  . famotidine  20 mg Oral Daily  . hydrALAZINE  25 mg Oral Q6H  . levothyroxine  50 mcg Oral Q breakfast  . magnesium oxide  200 mg Oral Daily  . montelukast  10 mg Oral QHS  . oseltamivir  75 mg Oral Daily  . pentosan polysulfate  100 mg Oral TID AC  . QUEtiapine  25 mg Oral QHS  . rOPINIRole  1-2 mg Oral BID   Continuous Infusions:  PRN Meds:.albuterol, guaiFENesin-dextromethorphan, HYDROcodone-acetaminophen, LORazepam, ondansetron (ZOFRAN) IV, ondansetron  Assessment & Plan   1. High fevers, shortness of breath, recent gastroenteritis like picture likely all due to Influ +ve infection - No SOB, CXR  stable, complete Tamiflu, diarrhea resolved, looks and feels better.  2. ARF on CKD 3 -due to #1 above, will hold ACE inhibitor, Improved, baseline 1.3 to 1.5  3. History of abdominal mass questionable bowel obstruction in the past, have repeated a KUB which appears unremarkable-seen by GI, symptom free, outpt follow.  4. H/O hypothyroidism- stable TSH and continue home dose Synthroid.   Lab Results  Component Value Date   TSH 2.014 09/27/2011     5.H/O hypertension - now, <3 second pauses on tele, symptom free,  D/W cards Dr Riley Kill, no significance of pauses <3 secs, will Dc Clonidine too, Coreg stopped, BP stable. Norvasc and hydralazine now. Stable.   6. Few NSVTs 5-6 beats X 3 on 09-29-11 - symptom free, D/W daughter bedside, Med Rx only, unable to tolerate B blocker due to Bradycardia & ASA due to allergy- stable lytes, post IV Mag  , EKG-Enzymes stable, pending  Echo. No surgical intervention per daughter. D/W Dr Daleen Squibb Cards- If Ef > 35%, outpt follow.    PT-OT for placement  DVT Prophylaxis  SCDs  See all Orders from today for further details     Leroy Sea M.D on 10/01/2011 at 12:23 PM  [facility] Office  [phone]

## 2011-10-01 NOTE — Progress Notes (Signed)
.  Clinical social worker completed patient psychosocial assessment, please see assessment in patient shadow chart.  Pt is open to short term rehab at a skilled nursing facility in [place] .Clinical social worker initiated skilled nursing facility search, see placement note in patient shadow chart. CSW will complete FL2 for MD signature and will place in pt shadow chart.   Catha Gosselin, Theresia Majors  [phone] .10/01/2011 14:41pm

## 2011-10-01 NOTE — Progress Notes (Signed)
Physical Therapy Treatment Patient Details Name: Leslie [name] MRN: [id number] DOB: 29-Jun-1922 Today's Date: 10/01/2011  PT Assessment/Plan  PT - Assessment/Plan PT Plan: Discharge plan remains appropriate PT Goals   progressing towards goals  PT Treatment Precautions/Restrictions  Precautions Precautions: Fall Precaution Comments: droplet Restrictions Weight Bearing Restrictions: No Mobility (including Balance) Bed Mobility Supine to Sit: 5: Supervision;HOB elevated (Comment degrees) (20) Supine to Sit Details (indicate cue type and reason): increased time and effort Sitting - Scoot to Edge of Bed: 5: Supervision Sitting - Scoot to [place] of Bed Details (indicate cue type and reason): increased time and effort, reciprocal scoot Sit to Supine - Left: 4: Min assist Sit to Supine - Left Details (indicate cue type and reason): to lift LE Transfers Sit to Stand: 5: Supervision;With upper extremity assist;From bed Stand to Sit: 5: Supervision;With upper extremity assist Ambulation/Gait Ambulation/Gait: Yes Ambulation/Gait Assistance: 5: Supervision Ambulation Distance (Feet): 14 Feet Assistive device: Rolling walker Gait Pattern: Decreased step length - right;Decreased step length - left;Trunk flexed Gait velocity: kyphotic, limited by fatigue, with 2 turns without increased LOB    Exercise  General Exercises - Lower Extremity Long Arc Quad: Strengthening;AROM;Both;10 reps Hip ABduction/ADduction: Strengthening;Standing;AROM;10 reps Toe Raises: AROM;Standing;Both;10 reps Heel Raises: AROM;10 reps (for balance strategy) Mini-Sqauts: AROM;Strengthening;Both;10 reps Other Exercises Other Exercises: repetitive sit to stands x 10 with BUE Other Exercises: standing knee flexion x 10 BLE [place] A/1 type exercise for strength and balance to minimize fall risk End of Session PT - End of Session Activity Tolerance: Patient tolerated treatment well Patient left: in bed;with bed alarm  set General Cognition: Impaired, at baseline (pt unable to keep track of reps)  Michaelene Song 10/01/2011, 10:02 AM

## 2011-10-02 LAB — CULTURE, BLOOD (ROUTINE X 2)
Culture  Setup Time: 201301021831
Culture: NO GROWTH

## 2011-10-02 LAB — STOOL CULTURE

## 2011-10-02 MED ORDER — AMLODIPINE BESYLATE 10 MG PO TABS: 10.0000 mg | ORAL_TABLET | Freq: Every day | ORAL | Status: DC

## 2011-10-02 MED ORDER — OSELTAMIVIR PHOSPHATE 75 MG PO CAPS: 75.0000 mg | ORAL_CAPSULE | Freq: Two times a day (BID) | ORAL | Status: AC

## 2011-10-02 MED ORDER — MAGNESIUM OXIDE 400 MG PO TABS: 200.0000 mg | ORAL_TABLET | Freq: Every day | ORAL | Status: DC

## 2011-10-02 MED ORDER — LORAZEPAM 1 MG PO TABS: 0.5000 mg | ORAL_TABLET | Freq: Two times a day (BID) | ORAL | Status: DC | PRN

## 2011-10-02 MED ORDER — HYDRALAZINE HCL 25 MG PO TABS: 25.0000 mg | ORAL_TABLET | Freq: Four times a day (QID) | ORAL | Status: DC

## 2011-10-02 NOTE — Discharge Summary (Signed)
Leslie [name], 78 y.o., DOB [birth year], MRN [id number]. Admission date: 09/26/2011 Discharge Date 10/02/2011 Primary MD No primary provider on file. Admitting Physician Leroy Sea, MD  Admission Diagnosis  Bowel obstruction [560.9] Weakness [780.79] Hypothyroidism [244.9] Hypertension [401.9] Acute renal failure [584.9] Hypoxia [799.02] Fever [780.60] Vomiting and diarrhea [787.03] FALL EMESIS  Discharge Diagnosis   Principal Problem:  *Weakness generalized Active Problems:  Gastroenteritis  Fever  SOB (shortness of breath)  Hypothyroidism  Bowel obstruction  Hypertension      Past Medical History  Diagnosis Date  . Hypertension   . Bowel obstruction 1987  . Hypothyroidism   . Interstitial cystitis   . Hypercholesteremia   . Headache   . Anxiety   . Depression   . Multiple respiratory allergies   . Restless leg syndrome   . Stroke 06/2002    right parietal nonhemorrhagic acute infarction  . Abdominal mass   . Fibromyalgia   . Parkinson's disease     dx'd 1990's; told she doesn't have it ~ 2000  . Bradycardia, sinus     Past Surgical History  Procedure Date  . Abdominal surgery 1970's    "for blockage"  . Cystoscopy with urethral dilatation 02/2006; 07/2005; 01/2005  . Cholecystectomy   . Tonsillectomy   . Appendectomy   . Abdominal hysterectomy   . Dilation and curettage of uterus 1940's  . Shoulder surgery 1970's    "for fx"     Hospital Course See H&P, Labs, Consult and Test reports for all details in brief, patient was admitted for   1. High fevers, shortness of breath, recent gastroenteritis like picture likely all due to Influ +ve infection - No SOB, CXR stable, complete Tamiflu for 3 more doses, diarrhea resolved, looks and feels better.    2. ARF on CKD 3 -due to #1 above, will hold ACE inhibitor, Improved, baseline 1.3 to 1.5 , repeat BMP in  1-12 days. ARB-HCTZ combo held.  3. History of abdominal mass questionable bowel obstruction in  the past, have repeated a KUB which appears unremarkable - seen by GI, symptom free, outpt GI follow. Not a surgery candidate.   4. H/O hypothyroidism- stable TSH and continue home dose Synthroid.   Lab Results   Component  Value  Date    TSH  2.014  09/27/2011     5.H/O hypertension - now, <3 second pauses on tele, symptom free, D/W cards Dr Riley Kill, no significance of pauses <3 secs, will Dc Clonidine too, Coreg stopped, BP stable. Norvasc and hydralazine now. Stable.    6. Few NSVTs 5-6 beats X 3 on 09-29-11 - symptom free, D/W daughter bedside, Med Rx only, unable to tolerate B blocker due to Bradycardia & ASA due to allergy- stable lytes, post IV Mag , EKG-Enzymes stable, Ef>55% on Echo. No surgical intervention per daughter. D/W Dr Daleen Squibb Cards-   outpt follow.       Significant Tests:  See full reports for all details    Echo - Left ventricle: The cavity size was normal. Systolic function was vigorous. The estimated ejection fraction was in the range of 65% to 70%. Wall motion was normal; there were no regional wall motion abnormalities. There was an increased relative contribution of atrial contraction to ventricular filling. Doppler parameters are consistent with abnormal left ventricular relaxation (grade 1 diastolic dysfunction). - Mitral valve: Calcified annulus. Mildly thickened leaflets    Portable Chest 1 View  09/27/2011  *RADIOLOGY REPORT*  Clinical Data: Cough  PORTABLE  CHEST - 1 VIEW  Comparison: September 25, 2037  Findings: Elevation of the right hemi diaphragm and right basilar atelectasis are unchanged.  The left lung is clear.  The cardiac silhouette, mediastinum, pulmonary vasculature are within normal limits.  IMPRESSION: Stable chest x-ray with no evidence of acute cardiac or pulmonary process.  Original Report Authenticated By: Brandon Melnick, M.D.   Dg Chest Port 1 View  09/26/2011  *RADIOLOGY REPORT*  Clinical Data: Shortness of breath.  PORTABLE CHEST - 1  VIEW 09/26/2011 0733 hours:  Comparison: Two-view chest x-ray 08/04/2011, 06/20/2011, 04/16/2011 [name] and 04/04/2008 [facility].  Findings: Respiratory motion blurs the image.  The patient's chin overlies the left lung apex.  Cardiac silhouette mildly enlarged for the AP portable technique, unchanged. Stable chronic elevation of the right hemidiaphragm and chronic scar/atelectasis at the right base.  Lungs otherwise clear.  Pulmonary vascularity normal. No visible pleural effusions.  Moderate sized hiatal hernia.  IMPRESSION: Stable mild cardiomegaly.  No acute cardiopulmonary disease. Stable chronic elevation of the right hemidiaphragm and associated chronic scar/atelectasis at the right base.  Hiatal hernia.  Original Report Authenticated By: Arnell Sieving, M.D.   Dg Abd Portable 1v  09/26/2011  *RADIOLOGY REPORT*  Clinical Data: Vomiting.  Diarrhea.  PORTABLE ABDOMEN - 1 VIEW  Comparison: 07/05/2011 CT.  Findings: Single supine view of the abdomen and pelvis.  Minimal convex left lumbar spine curvature.  Mild osteopenia.  Numerous wires project over the abdomen.  Gas within nondistended large and small bowel.  No pneumatosis or definite free intraperitoneal air. Phleboliths in the pelvis. No abnormal abdominal calcifications. No appendicolith.  Minimal distal gas.  IMPRESSION: No acute findings.  Original Report Authenticated By: Consuello Bossier, M.D.     Today   Subjective:   Leslie [name] today has no headache,no chest abdominal pain,no new weakness tingling or numbness, feels much better wants to go home today.   Objective:   Blood pressure 144/58, pulse 73, temperature 97.8 F (36.6 C), temperature source Oral, resp. rate 20, height 5\' 3"  (1.6 m), weight 65.59 kg (144 lb 9.6 oz), SpO2 91.00%.  Intake/Output Summary (Last 24 hours) at 10/02/11 1201 Last data filed at 10/02/11 1006  Gross per 24 hour  Intake    480 ml  Output   1050 ml  Net   -570 ml    Exam Awake Alert,  Oriented *3, No new F.N deficits, Normal affect [place].AT,PERRAL Supple Neck,No JVD, No cervical lymphadenopathy appriciated.  Symmetrical Chest wall movement, Good air movement bilaterally, CTAB RRR,No Gallops,Rubs or new Murmurs, No Parasternal Heave +ve B.Sounds, Abd Soft, Non tender, No organomegaly appriciated, No rebound -guarding or rigidity. No Cyanosis, Clubbing or edema, No new Rash or bruise  Data Review      CBC w Diff: Lab Results  Component Value Date   WBC 6.7 09/27/2011   HGB 12.8 09/27/2011   HCT 40.9 09/27/2011   PLT 128* 09/27/2011   LYMPHOPCT 16 09/26/2011   MONOPCT 8 09/26/2011   EOSPCT 0 09/26/2011   BASOPCT 0 09/26/2011   CMP: Lab Results  Component Value Date   NA 133* 09/29/2011   K 4.4 09/29/2011   CL 101 09/29/2011   CO2 19 09/29/2011   BUN 53* 09/29/2011   CREATININE 1.93* 09/29/2011   PROT 8.9* 09/26/2011   ALBUMIN 4.2 09/26/2011   BILITOT 0.4 09/26/2011   ALKPHOS 65 09/26/2011   AST 124* 09/26/2011   ALT 45* 09/26/2011  .  Micro Results  Recent Results (from the past 240 hour(s))  CULTURE, BLOOD (ROUTINE X 2)     Status: Normal   Collection Time   09/26/11 10:50 AM      Component Value Range Status Comment   Specimen Description BLOOD RIGHT ARM   Final    Special Requests BOTTLES DRAWN AEROBIC AND ANAEROBIC 10CC   Final    Setup Time [id number]   Final    Culture NO GROWTH 5 DAYS   Final    Report Status 10/02/2011 FINAL   Final   CULTURE, BLOOD (ROUTINE X 2)     Status: Normal   Collection Time   09/26/11 10:55 AM      Component Value Range Status Comment   Specimen Description BLOOD RIGHT ARM   Final    Special Requests BOTTLES DRAWN AEROBIC ONLY 5CC   Final    Setup Time [id number]   Final    Culture NO GROWTH 5 DAYS   Final    Report Status 10/02/2011 FINAL   Final   STOOL CULTURE     Status: Normal   Collection Time   09/28/11  6:27 PM      Component Value Range Status Comment   Specimen Description STOOL   Final    Special Requests 09/28/11 2050 ADD 09/28/11 2050    Final    Culture     Final    Value: NO SALMONELLA, SHIGELLA, CAMPYLOBACTER, OR YERSINIA ISOLATED     Note: REDUCED NORMAL FLORA PRESENT   Report Status 10/02/2011 FINAL   Final   CLOSTRIDIUM DIFFICILE BY PCR     Status: Normal   Collection Time   09/28/11  6:27 PM      Component Value Range Status Comment   C difficile by pcr NEGATIVE  NEGATIVE  Final      Discharge Instructions     Follow with Primary MD / SNF MD in 2 days   Get CBC, CMP, checked 2 days by Primary MD and again as instructed by your Primary MD.   Get Medicines reviewed and adjusted.  Please request your Prim.MD to go over all Hospital Tests and Procedure/Radiological results at the follow up, please get all Hospital records sent to your Prim MD by signing hospital release before you go home.  Follow-up Information   Follow up with Valera Castle, MD. Make an appointment in 1 week.  Contact information:  [address] [place] [address] [phone]      Activity: Fall precautions use walker/cane & assistance as needed  Diet: Cardiac  with Aspiration precautions.  For Heart failure patients - Check your Weight same time everyday, if you gain over 2 pounds, or you develop in leg swelling, experience more shortness of breath or chest pain, call your Primary MD immediately. Follow Cardiac Low Salt Diet and 1.8 lit/day fluid restriction.  Disposition Home  If you experience worsening of your admission symptoms, develop shortness of breath, life threatening emergency, suicidal or homicidal thoughts you must seek medical attention immediately by calling 911 or calling your MD immediately  if symptoms less severe.  Follow-up Information    Follow up with Valera Castle, MD. Make an appointment in 1 week.   Contact information:   [address] [place] [address] [phone]          Discharge Medications    Quincey, Nored  Home Medication Instructions  ZOX:[id number]   Printed on:10/02/11 1206  Medication Information                    QUEtiapine (SEROQUEL) 25 MG tablet Take 25 mg by mouth at bedtime.             levothyroxine (SYNTHROID, LEVOTHROID) 50 MCG tablet Take 50 mcg by mouth daily.             pentosan polysulfate (ELMIRON) 100 MG capsule Take 100 mg by mouth 3 (three) times daily before meals.             rOPINIRole (REQUIP) 1 MG tablet Take 1-2 mg by mouth 2 (two) times daily. Take 1 tablet every morning Take 2 tablets at bedtime            montelukast (SINGULAIR) 10 MG tablet Take 10 mg by mouth at bedtime.             famotidine (PEPCID) 20 MG tablet Take 20 mg by mouth 2 (two) times daily.             loperamide (IMODIUM) 2 MG capsule Take 2 mg by mouth 4 (four) times daily as needed. For loose stool            amLODipine (NORVASC) 10 MG tablet Take 1 tablet (10 mg total) by mouth daily.           hydrALAZINE (APRESOLINE) 25 MG tablet Take 1 tablet (25 mg total) by mouth every 6 (six) hours.           magnesium oxide (MAG-OX) 400 MG tablet Take 0.5 tablets (200 mg total) by mouth daily.           oseltamivir (TAMIFLU) 75 MG capsule Take 1 capsule (75 mg total) by mouth 2 (two) times daily.           LORazepam (ATIVAN) 1 MG tablet Take 0.5 tablets (0.5 mg total) by mouth every 12 (twelve) hours as needed for anxiety. anxiety              Total Time in preparing paper work, data evaluation and todays exam - 35 minutes  Leroy Sea M.D on 10/02/2011 at 12:01 PM  [facility] Office  [phone]

## 2011-10-03 NOTE — Progress Notes (Signed)
Utilization review completed- post discharge 

## 2011-10-20 ENCOUNTER — Encounter (HOSPITAL_COMMUNITY): Payer: Self-pay | Admitting: Emergency Medicine

## 2011-10-20 ENCOUNTER — Emergency Department (HOSPITAL_COMMUNITY)
Admission: EM | Admit: 2011-10-20 | Discharge: 2011-10-21 | Disposition: A | Discharge status: Home / Self Care | Payer: Medicare Other | Attending: Emergency Medicine | Admitting: Emergency Medicine

## 2011-10-20 ENCOUNTER — Emergency Department (HOSPITAL_COMMUNITY): Payer: Medicare Other

## 2011-10-20 DIAGNOSIS — E039 Hypothyroidism, unspecified: Secondary | ICD-10-CM | POA: Insufficient documentation

## 2011-10-20 DIAGNOSIS — I10 Essential (primary) hypertension: Secondary | ICD-10-CM | POA: Insufficient documentation

## 2011-10-20 DIAGNOSIS — Z8673 Personal history of transient ischemic attack (TIA), and cerebral infarction without residual deficits: Secondary | ICD-10-CM | POA: Insufficient documentation

## 2011-10-20 DIAGNOSIS — R10819 Abdominal tenderness, unspecified site: Secondary | ICD-10-CM | POA: Insufficient documentation

## 2011-10-20 DIAGNOSIS — R109 Unspecified abdominal pain: Secondary | ICD-10-CM | POA: Insufficient documentation

## 2011-10-20 DIAGNOSIS — K5900 Constipation, unspecified: Secondary | ICD-10-CM | POA: Insufficient documentation

## 2011-10-20 LAB — URINALYSIS, ROUTINE W REFLEX MICROSCOPIC
Glucose, UA: NEGATIVE mg/dL
Leukocytes, UA: NEGATIVE
Nitrite: NEGATIVE
Protein, ur: 30 mg/dL — AB
Urobilinogen, UA: 1 mg/dL (ref 0.0–1.0)

## 2011-10-20 LAB — CBC
HCT: 41.6 % (ref 36.0–46.0)
MCH: 28.9 pg (ref 26.0–34.0)
MCHC: 32.2 g/dL (ref 30.0–36.0)
MCV: 89.7 fL (ref 78.0–100.0)
RDW: 14.8 % (ref 11.5–15.5)

## 2011-10-20 LAB — LACTIC ACID, PLASMA: Lactic Acid, Venous: 1.7 mmol/L (ref 0.5–2.2)

## 2011-10-20 LAB — COMPREHENSIVE METABOLIC PANEL
AST: 19 U/L (ref 0–37)
CO2: 25 mEq/L (ref 19–32)
Calcium: 9.8 mg/dL (ref 8.4–10.5)
Creatinine, Ser: 1.29 mg/dL — ABNORMAL HIGH (ref 0.50–1.10)
GFR calc non Af Amer: 36 mL/min — ABNORMAL LOW (ref >90)

## 2011-10-20 LAB — DIFFERENTIAL
Basophils Absolute: 0 10*3/uL (ref 0.0–0.1)
Basophils Relative: 0 % (ref 0–1)
Eosinophils Relative: 4 % (ref 0–5)
Lymphocytes Relative: 14 % (ref 12–46)
Monocytes Absolute: 1.1 10*3/uL — ABNORMAL HIGH (ref 0.1–1.0)

## 2011-10-20 MED ORDER — FENTANYL CITRATE 0.05 MG/ML IJ SOLN
50.0000 ug | Freq: Once | INTRAMUSCULAR | Status: AC
  Administered 2011-10-20: 50 ug via INTRAVENOUS
  Filled 2011-10-20: qty 2

## 2011-10-20 NOTE — ED Notes (Signed)
Pt c/o lower abd pain, pt dx from [facility] 3 days ago, very small BM since that time, last 24 hours pt has taken 12 Colace, 2 doses of Mirilax and 2 fleets enemas with no relief. Pt has known adhesion. Has had SBO 20ish years ago. Pt grimacing in pain. Daughter at bedside.

## 2011-10-20 NOTE — ED Notes (Signed)
Pt tx from home, pt dx from [facility] 5 days. c/o abd pain x 3 days, pt has had 1 small BM last 3 days.

## 2011-10-20 NOTE — ED Notes (Signed)
ZOX:[id number]<AV> Expected date:10/20/11<BR> Expected time: 6:17 PM<BR> Means of arrival:Ambulance<BR> Comments:<BR> M41 - 89yoF Constipated, no bm in 2 days

## 2011-10-21 NOTE — ED Provider Notes (Signed)
History     CSN: [id number]  Arrival date & time 10/20/11  1827   First MD Initiated Contact with Patient 10/20/11 1908      Chief Complaint  Patient presents with  . Abdominal Pain    (Consider location/radiation/quality/duration/timing/severity/associated sxs/prior treatment) HPI Patient is an 78 yo F who complains of diffuse abdominal pain that is a 10/10.  She has been constipated for several days and has tried multiple enemas at home without relief.  She denies fevers, nausea, vomiting, urinary symptoms, or respiratory symptoms.  Nothing has made the pain better and it is worse with palpation.  There are no other associated or modifying factors. Past Medical History  Diagnosis Date  . Hypertension   . Bowel obstruction 1987  . Hypothyroidism   . Interstitial cystitis   . Hypercholesteremia   . Headache   . Anxiety   . Depression   . Multiple respiratory allergies   . Restless leg syndrome   . Stroke 06/2002    right parietal nonhemorrhagic acute infarction  . Abdominal mass   . Fibromyalgia   . Parkinson's disease     dx'd 1990's; told she doesn't have it ~ 2000  . Bradycardia, sinus     Past Surgical History  Procedure Date  . Abdominal surgery 1970's    "for blockage"  . Cystoscopy with urethral dilatation 02/2006; 07/2005; 01/2005  . Cholecystectomy   . Tonsillectomy   . Appendectomy   . Abdominal hysterectomy   . Dilation and curettage of uterus 1940's  . Shoulder surgery 1970's    "for fx"    History reviewed. No pertinent family history.  History  Substance Use Topics  . Smoking status: Never Smoker   . Smokeless tobacco: Never Used  . Alcohol Use: No    OB History    Grav Para Term Preterm Abortions TAB SAB Ect Mult Living                  Review of Systems  Constitutional: Negative.   HENT: Negative.   Eyes: Negative.   Respiratory: Negative.   Cardiovascular: Negative.   Gastrointestinal: Positive for abdominal pain and  constipation.  Genitourinary: Negative.   Musculoskeletal: Negative.   Skin: Negative.   Neurological: Negative.   Hematological: Negative.   Psychiatric/Behavioral: Negative.   All other systems reviewed and are negative.    Allergies  Compazine; Penicillins; Sulfa antibiotics; Aspirin; and Iohexol  Home Medications   Current Outpatient Rx  Name Route Sig Dispense Refill  . ACETAMINOPHEN 500 MG PO TABS Oral Take 1,000 mg by mouth at bedtime.    Marland Kitchen FAMOTIDINE 20 MG PO TABS Oral Take 20 mg by mouth 2 (two) times daily.      Marland Kitchen LEVOTHYROXINE SODIUM 50 MCG PO TABS Oral Take 50 mcg by mouth daily.      Marland Kitchen LORAZEPAM 1 MG PO TABS Oral Take 0.5 tablets (0.5 mg total) by mouth every 12 (twelve) hours as needed for anxiety. anxiety 30 tablet 0  . MONTELUKAST SODIUM 10 MG PO TABS Oral Take 10 mg by mouth daily.     Marland Kitchen PENTOSAN POLYSULFATE SODIUM 100 MG PO CAPS Oral Take 100 mg by mouth 3 (three) times daily before meals.      Marland Kitchen QUETIAPINE FUMARATE 25 MG PO TABS Oral Take 25 mg by mouth at bedtime.      Marland Kitchen ROPINIROLE HCL 1 MG PO TABS Oral Take 1-2 mg by mouth See admin instructions. Take 1 tablet every morning  Take 2 tablets at bedtime    . VALSARTAN-HYDROCHLOROTHIAZIDE 320-12.5 MG PO TABS Oral Take 1 tablet by mouth daily.      BP 118/71  Pulse 71  Temp(Src) 97.3 F (36.3 C) (Oral)  Resp 16  SpO2 98%  Physical Exam  Nursing note and vitals reviewed. Constitutional: She is oriented to person, place, and time. She appears well-developed and well-nourished. No distress.       uncomfortable  HENT:  Head: Normocephalic and atraumatic.  Eyes: Pupils are equal, round, and reactive to light.  Neck: Normal range of motion.  Cardiovascular: Normal rate, regular rhythm, normal heart sounds and intact distal pulses.  Exam reveals no gallop and no friction rub.   No murmur heard. Pulmonary/Chest: Effort normal and breath sounds normal. No respiratory distress. She has no wheezes. She has no rales.   Abdominal: Soft. Bowel sounds are normal. She exhibits no distension. There is tenderness. There is no rebound and no guarding.       Diffuse TTP, worst in lower quadrants BL  Genitourinary: Guaiac negative stool.       No stool in the rectal vault  Musculoskeletal: Normal range of motion.  Neurological: She is alert and oriented to person, place, and time. No cranial nerve deficit. She exhibits normal muscle tone. Coordination normal.  Skin: Skin is warm and dry.  Psychiatric: She has a normal mood and affect.    ED Course  Procedures (including critical care time)  Labs Reviewed  CBC - Abnormal; Notable for the following:    WBC 14.1 (*)    All other components within normal limits  DIFFERENTIAL - Abnormal; Notable for the following:    Neutro Abs 10.5 (*)    Monocytes Absolute 1.1 (*)    All other components within normal limits  COMPREHENSIVE METABOLIC PANEL - Abnormal; Notable for the following:    Sodium 133 (*)    Potassium 5.2 (*)    BUN 24 (*)    Creatinine, Ser 1.29 (*)    GFR calc non Af Amer 36 (*)    GFR calc Af Amer 42 (*)    All other components within normal limits  URINALYSIS, ROUTINE W REFLEX MICROSCOPIC - Abnormal; Notable for the following:    Color, Urine AMBER (*) BIOCHEMICALS MAY BE AFFECTED BY COLOR   Protein, ur 30 (*)    All other components within normal limits  PROTIME-INR - Abnormal; Notable for the following:    Prothrombin Time 16.3 (*)    All other components within normal limits  LIPASE, BLOOD  LACTIC ACID, PLASMA  URINE MICROSCOPIC-ADD ON   Dg Abd Acute W/chest  10/20/2011  *RADIOLOGY REPORT*  Clinical Data: Abdominal pain and constipation.  History of surgery for obstruction 20 years ago.  ACUTE ABDOMEN SERIES (ABDOMEN 2 VIEW & CHEST 1 VIEW)  Comparison: 09/26/2011  Findings: Shallow inspiration with elevation of the right hemidiaphragm.  Normal heart size and pulmonary vascularity. Slight fibrosis in the lungs.  Atelectasis in the left  lung base. No focal airspace consolidation.  No pneumothorax.  No blunting of costophrenic angles.  Postoperative changes in the right shoulder. Calcification of the aorta.  Probable esophageal hiatal hernia.  There is a paucity of gas in the abdomen.  No bowel distension is identified.  A few scattered air-fluid levels.  These appear to be mostly in the colon.  No free intra-abdominal air.  Degenerative changes in the lumbar spine and hips.  Calcified phleboliths in the pelvis.  Vascular  calcifications.  No radiopaque stones.  IMPRESSION: Nonobstructive bowel gas pattern.  No evidence of active pulmonary disease.  Original Report Authenticated By: Marlon Pel, M.D.     1. Constipation       MDM  Patient was evaluated and had mainly lower abdominal pain.  She also complained of constipation.  Appropriate work-up was performed and patient had no evidence of obstruction on exam or on AAS.  She had a leukocytosis of 14.8 but no UTI and no other remarkable findings.  Her pain was treated with fentanyl and following rectal patient began to have bowel movements.  Patient had 4-5 large volume stools and felt much better.  She had no pain at all.  Her daughter and I discussed her WBC but agreed that given the resolution of the patient's symptoms with defecation no further work-up was warranted today.  If patient develops fever, nausea, vomiting, worsening pain, or other emergent concerns she was told that she is welcome to return for additional evaluation.  She was discharged on good condition.        Cyndra Numbers, MD 10/25/11 2155

## 2012-04-24 ENCOUNTER — Other Ambulatory Visit: Payer: Self-pay | Admitting: *Deleted

## 2012-04-24 DIAGNOSIS — G459 Transient cerebral ischemic attack, unspecified: Secondary | ICD-10-CM

## 2012-04-28 ENCOUNTER — Ambulatory Visit
Admission: RE | Admit: 2012-04-28 | Discharge: 2012-04-28 | Disposition: A | Discharge status: Home / Self Care | Payer: Medicare Other | Source: Ambulatory Visit | Attending: *Deleted | Admitting: *Deleted

## 2012-04-28 DIAGNOSIS — G459 Transient cerebral ischemic attack, unspecified: Secondary | ICD-10-CM

## 2013-08-06 IMAGING — CR DG ABD PORTABLE 1V
1 series · 1 of 1 positions shown · non-contrast
Comparison: 07/05/2011 CT.

CLINICAL DATA: Vomiting.  Diarrhea.

PORTABLE ABDOMEN - 1 VIEW

[AP]
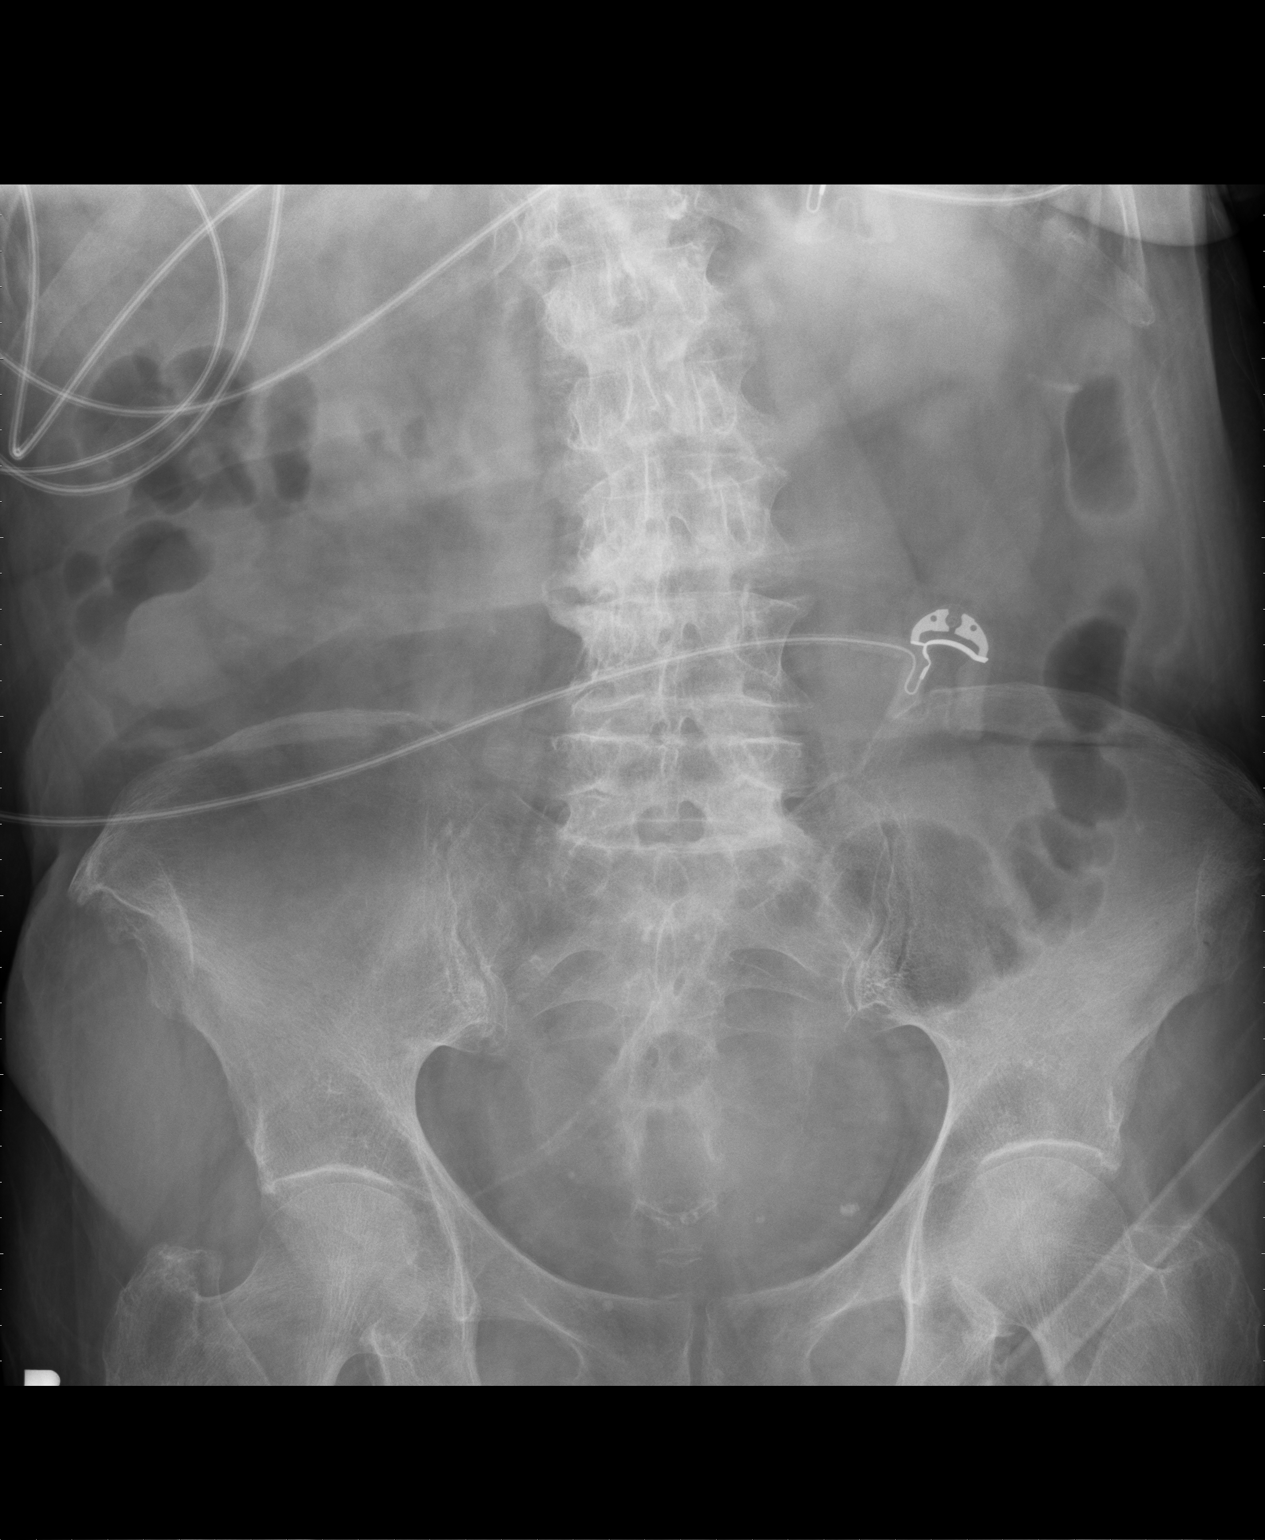

[1 of 1 positions shown; findings below may reference images not displayed]

FINDINGS: Single supine view of the abdomen and pelvis.  Minimal
convex left lumbar spine curvature.  Mild osteopenia.  Numerous
wires project over the abdomen.  Gas within nondistended large and
small bowel.  No pneumatosis or definite free intraperitoneal air.
Phleboliths in the pelvis. No abnormal abdominal calcifications.
No appendicolith.

Minimal distal gas.
IMPRESSION: No acute findings.

## 2013-08-06 IMAGING — CR DG CHEST 1V PORT
1 series · 1 of 1 positions shown · non-contrast
Comparison: Two-view chest x-ray 08/04/2011, 06/20/2011, 04/16/2011
[facility] and 04/04/2008 [HOSPITAL].

CLINICAL DATA: Shortness of breath.

PORTABLE CHEST - 1 VIEW [DATE]/0793 7633 hours:

[view not recorded]
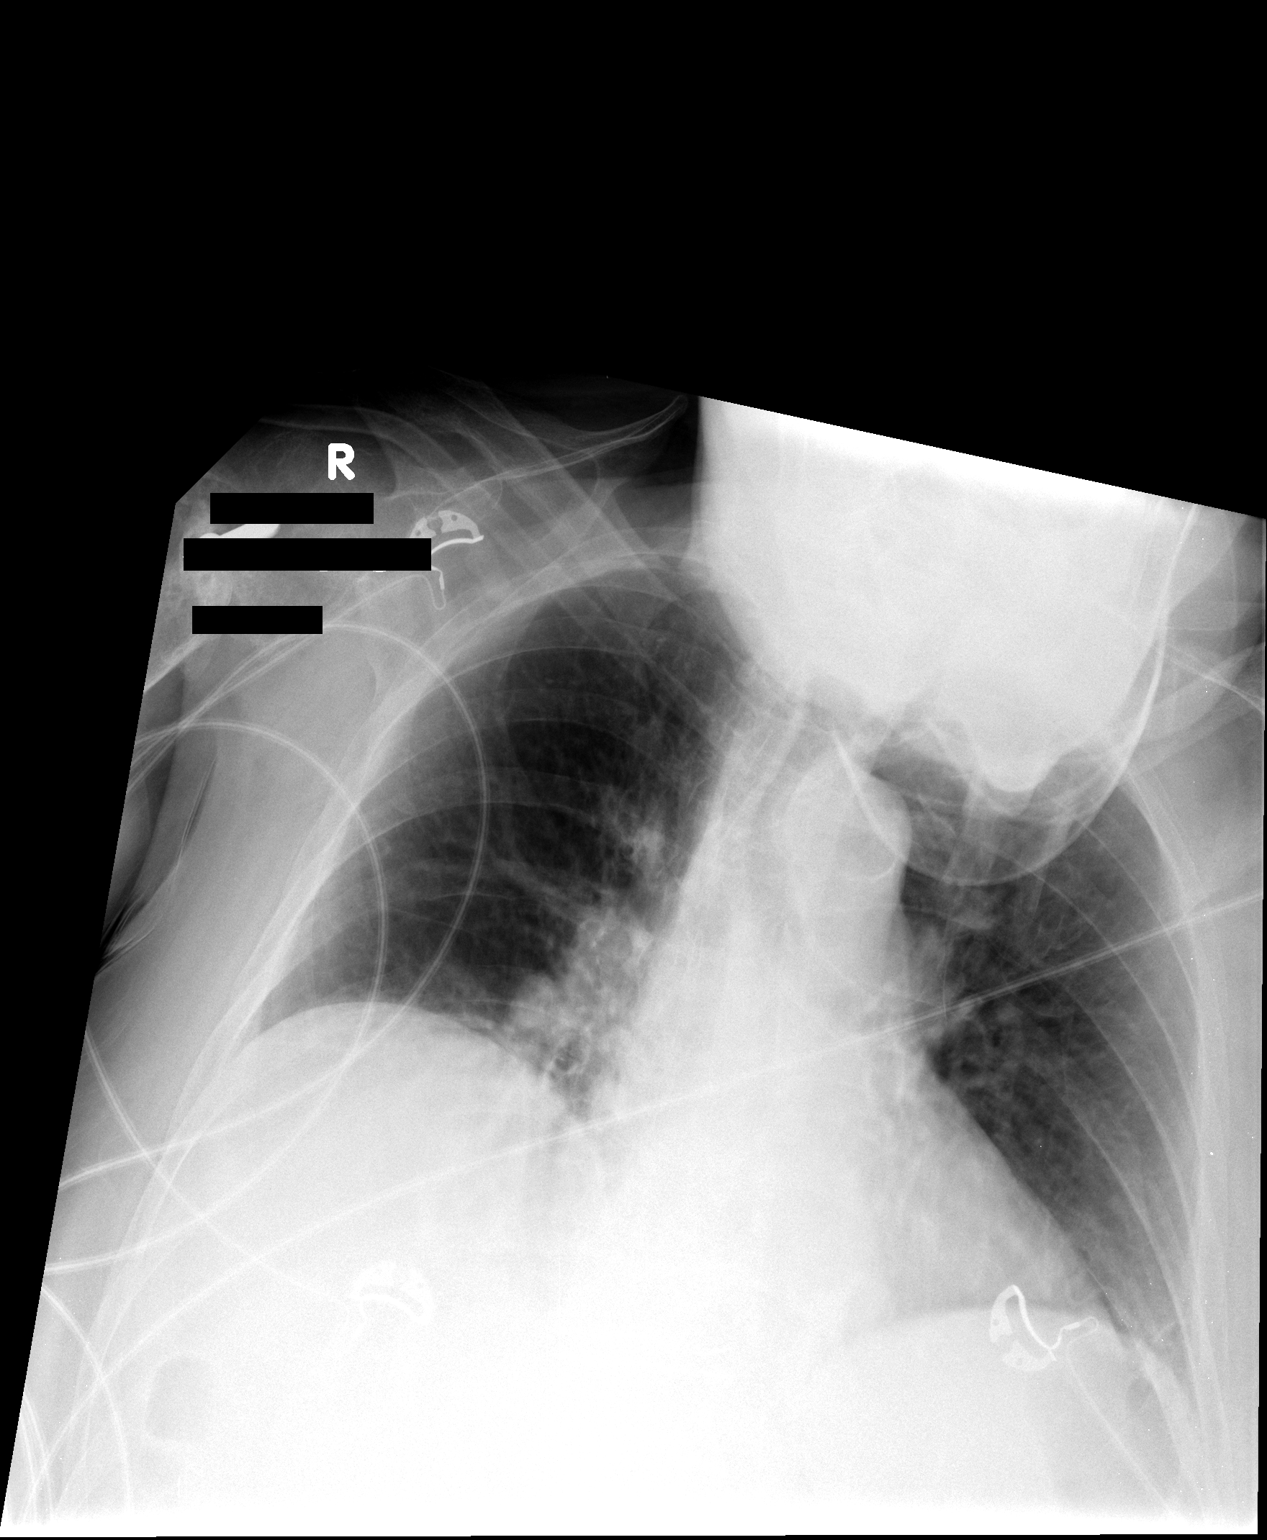

[1 of 1 positions shown; findings below may reference images not displayed]

FINDINGS: Respiratory motion blurs the image.  The patient's chin
overlies the left lung apex.  Cardiac silhouette mildly enlarged
for the AP portable technique, unchanged. Stable chronic elevation
of the right hemidiaphragm and chronic scar/atelectasis at the
right base.  Lungs otherwise clear.  Pulmonary vascularity normal.
No visible pleural effusions.  Moderate sized hiatal hernia.
IMPRESSION: Stable mild cardiomegaly.  No acute cardiopulmonary disease.
Stable chronic elevation of the right hemidiaphragm and associated
chronic scar/atelectasis at the right base.  Hiatal hernia.

## 2013-08-07 IMAGING — CR DG CHEST 1V PORT
1 series · 1 of 1 positions shown · non-contrast
Comparison: September 25, 2037

CLINICAL DATA: Cough

PORTABLE CHEST - 1 VIEW

[AP]
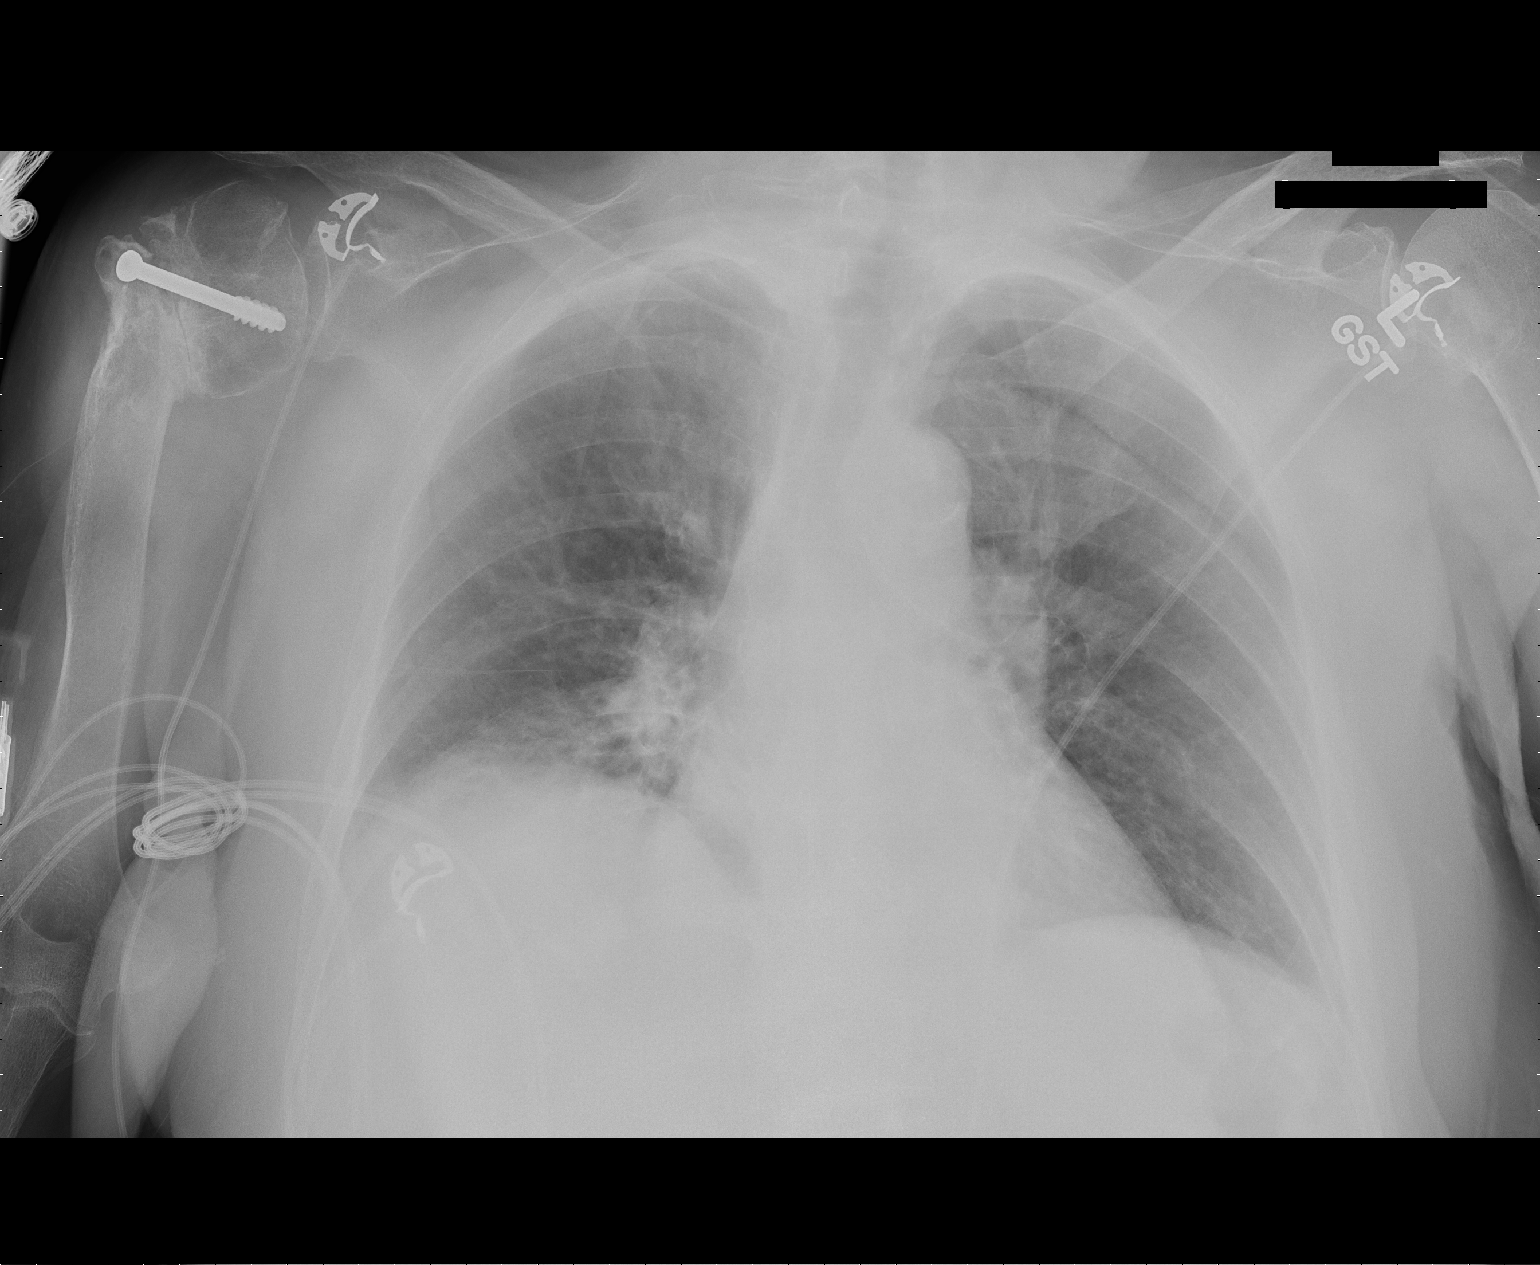

[1 of 1 positions shown; findings below may reference images not displayed]

FINDINGS: Elevation of the right hemi diaphragm and right basilar
atelectasis are unchanged.  The left lung is clear.  The cardiac
silhouette, mediastinum, pulmonary vasculature are within normal
limits.
IMPRESSION: Stable chest x-ray with no evidence of acute cardiac or pulmonary
process.

## 2013-12-18 ENCOUNTER — Encounter (HOSPITAL_COMMUNITY): Payer: Self-pay | Admitting: Emergency Medicine

## 2013-12-18 ENCOUNTER — Emergency Department (HOSPITAL_COMMUNITY): Payer: Medicare Other

## 2013-12-18 ENCOUNTER — Inpatient Hospital Stay (HOSPITAL_COMMUNITY)
Admission: EM | Admit: 2013-12-18 | Discharge: 2013-12-22 | DRG: 682 | Disposition: A | Discharge status: Skilled Nursing Facility | Payer: Medicare Other | Attending: Internal Medicine | Admitting: Internal Medicine

## 2013-12-18 DIAGNOSIS — F411 Generalized anxiety disorder: Secondary | ICD-10-CM | POA: Diagnosis present

## 2013-12-18 DIAGNOSIS — R5381 Other malaise: Secondary | ICD-10-CM | POA: Diagnosis present

## 2013-12-18 DIAGNOSIS — Z7982 Long term (current) use of aspirin: Secondary | ICD-10-CM

## 2013-12-18 DIAGNOSIS — E860 Dehydration: Secondary | ICD-10-CM

## 2013-12-18 DIAGNOSIS — I10 Essential (primary) hypertension: Secondary | ICD-10-CM

## 2013-12-18 DIAGNOSIS — R197 Diarrhea, unspecified: Secondary | ICD-10-CM | POA: Diagnosis present

## 2013-12-18 DIAGNOSIS — Z8673 Personal history of transient ischemic attack (TIA), and cerebral infarction without residual deficits: Secondary | ICD-10-CM

## 2013-12-18 DIAGNOSIS — Z79899 Other long term (current) drug therapy: Secondary | ICD-10-CM

## 2013-12-18 DIAGNOSIS — F329 Major depressive disorder, single episode, unspecified: Secondary | ICD-10-CM | POA: Diagnosis present

## 2013-12-18 DIAGNOSIS — J9600 Acute respiratory failure, unspecified whether with hypoxia or hypercapnia: Secondary | ICD-10-CM | POA: Diagnosis present

## 2013-12-18 DIAGNOSIS — R509 Fever, unspecified: Secondary | ICD-10-CM

## 2013-12-18 DIAGNOSIS — E039 Hypothyroidism, unspecified: Secondary | ICD-10-CM

## 2013-12-18 DIAGNOSIS — I498 Other specified cardiac arrhythmias: Secondary | ICD-10-CM

## 2013-12-18 DIAGNOSIS — E872 Acidosis, unspecified: Secondary | ICD-10-CM | POA: Diagnosis present

## 2013-12-18 DIAGNOSIS — N183 Chronic kidney disease, stage 3 unspecified: Secondary | ICD-10-CM | POA: Diagnosis present

## 2013-12-18 DIAGNOSIS — R0902 Hypoxemia: Secondary | ICD-10-CM

## 2013-12-18 DIAGNOSIS — I4902 Ventricular flutter: Secondary | ICD-10-CM | POA: Diagnosis present

## 2013-12-18 DIAGNOSIS — N1330 Unspecified hydronephrosis: Secondary | ICD-10-CM | POA: Diagnosis present

## 2013-12-18 DIAGNOSIS — R0602 Shortness of breath: Secondary | ICD-10-CM

## 2013-12-18 DIAGNOSIS — I4892 Unspecified atrial flutter: Secondary | ICD-10-CM | POA: Diagnosis present

## 2013-12-18 DIAGNOSIS — R32 Unspecified urinary incontinence: Secondary | ICD-10-CM | POA: Diagnosis present

## 2013-12-18 DIAGNOSIS — R7989 Other specified abnormal findings of blood chemistry: Secondary | ICD-10-CM

## 2013-12-18 DIAGNOSIS — N189 Chronic kidney disease, unspecified: Secondary | ICD-10-CM

## 2013-12-18 DIAGNOSIS — R1900 Intra-abdominal and pelvic swelling, mass and lump, unspecified site: Secondary | ICD-10-CM

## 2013-12-18 DIAGNOSIS — G20A1 Parkinson's disease without dyskinesia, without mention of fluctuations: Secondary | ICD-10-CM | POA: Diagnosis present

## 2013-12-18 DIAGNOSIS — R531 Weakness: Secondary | ICD-10-CM

## 2013-12-18 DIAGNOSIS — E7800 Pure hypercholesterolemia, unspecified: Secondary | ICD-10-CM | POA: Diagnosis present

## 2013-12-18 DIAGNOSIS — R079 Chest pain, unspecified: Secondary | ICD-10-CM | POA: Diagnosis present

## 2013-12-18 DIAGNOSIS — K56609 Unspecified intestinal obstruction, unspecified as to partial versus complete obstruction: Secondary | ICD-10-CM

## 2013-12-18 DIAGNOSIS — B961 Klebsiella pneumoniae [K. pneumoniae] as the cause of diseases classified elsewhere: Secondary | ICD-10-CM | POA: Diagnosis present

## 2013-12-18 DIAGNOSIS — I440 Atrioventricular block, first degree: Secondary | ICD-10-CM | POA: Diagnosis present

## 2013-12-18 DIAGNOSIS — R51 Headache: Secondary | ICD-10-CM | POA: Diagnosis present

## 2013-12-18 DIAGNOSIS — R001 Bradycardia, unspecified: Secondary | ICD-10-CM

## 2013-12-18 DIAGNOSIS — N390 Urinary tract infection, site not specified: Secondary | ICD-10-CM

## 2013-12-18 DIAGNOSIS — N3010 Interstitial cystitis (chronic) without hematuria: Secondary | ICD-10-CM | POA: Diagnosis present

## 2013-12-18 DIAGNOSIS — E785 Hyperlipidemia, unspecified: Secondary | ICD-10-CM | POA: Diagnosis present

## 2013-12-18 DIAGNOSIS — I129 Hypertensive chronic kidney disease with stage 1 through stage 4 chronic kidney disease, or unspecified chronic kidney disease: Principal | ICD-10-CM | POA: Diagnosis present

## 2013-12-18 DIAGNOSIS — R5383 Other fatigue: Secondary | ICD-10-CM

## 2013-12-18 DIAGNOSIS — IMO0001 Reserved for inherently not codable concepts without codable children: Secondary | ICD-10-CM | POA: Diagnosis present

## 2013-12-18 DIAGNOSIS — F3289 Other specified depressive episodes: Secondary | ICD-10-CM | POA: Diagnosis present

## 2013-12-18 DIAGNOSIS — K529 Noninfective gastroenteritis and colitis, unspecified: Secondary | ICD-10-CM

## 2013-12-18 DIAGNOSIS — G20 Parkinson's disease: Secondary | ICD-10-CM | POA: Diagnosis present

## 2013-12-18 DIAGNOSIS — G2581 Restless legs syndrome: Secondary | ICD-10-CM | POA: Diagnosis present

## 2013-12-18 DIAGNOSIS — Z66 Do not resuscitate: Secondary | ICD-10-CM | POA: Diagnosis present

## 2013-12-18 HISTORY — DX: Chronic kidney disease, unspecified: N18.9

## 2013-12-18 LAB — URINALYSIS, ROUTINE W REFLEX MICROSCOPIC
Bilirubin Urine: NEGATIVE
GLUCOSE, UA: NEGATIVE mg/dL
Ketones, ur: NEGATIVE mg/dL
Nitrite: NEGATIVE
PROTEIN: 100 mg/dL — AB
SPECIFIC GRAVITY, URINE: 1.008 (ref 1.005–1.030)
Urobilinogen, UA: 0.2 mg/dL (ref 0.0–1.0)
pH: 5.5 (ref 5.0–8.0)

## 2013-12-18 LAB — BASIC METABOLIC PANEL
BUN: 21 mg/dL (ref 6–23)
CALCIUM: 8.6 mg/dL (ref 8.4–10.5)
CHLORIDE: 108 meq/L (ref 96–112)
CO2: 19 meq/L (ref 19–32)
Creatinine, Ser: 1.51 mg/dL — ABNORMAL HIGH (ref 0.50–1.10)
GFR calc Af Amer: 34 mL/min — ABNORMAL LOW (ref >90)
GFR calc non Af Amer: 29 mL/min — ABNORMAL LOW (ref >90)
GLUCOSE: 101 mg/dL — AB (ref 70–99)
POTASSIUM: 4.5 meq/L (ref 3.7–5.3)
SODIUM: 143 meq/L (ref 137–147)

## 2013-12-18 LAB — CBC WITH DIFFERENTIAL/PLATELET
BASOS ABS: 0 10*3/uL (ref 0.0–0.1)
Basophils Relative: 0 % (ref 0–1)
EOS PCT: 9 % — AB (ref 0–5)
Eosinophils Absolute: 0.6 10*3/uL (ref 0.0–0.7)
HCT: 39.1 % (ref 36.0–46.0)
Hemoglobin: 12.4 g/dL (ref 12.0–15.0)
LYMPHS ABS: 1.6 10*3/uL (ref 0.7–4.0)
LYMPHS PCT: 24 % (ref 12–46)
MCH: 29 pg (ref 26.0–34.0)
MCHC: 31.7 g/dL (ref 30.0–36.0)
MCV: 91.6 fL (ref 78.0–100.0)
Monocytes Absolute: 0.6 10*3/uL (ref 0.1–1.0)
Monocytes Relative: 8 % (ref 3–12)
NEUTROS ABS: 4.1 10*3/uL (ref 1.7–7.7)
NEUTROS PCT: 60 % (ref 43–77)
PLATELETS: 181 10*3/uL (ref 150–400)
RBC: 4.27 MIL/uL (ref 3.87–5.11)
RDW: 14.9 % (ref 11.5–15.5)
WBC: 6.8 10*3/uL (ref 4.0–10.5)

## 2013-12-18 LAB — BLOOD GAS, ARTERIAL
ACID-BASE DEFICIT: 0.7 mmol/L (ref 0.0–2.0)
Bicarbonate: 25.1 mEq/L — ABNORMAL HIGH (ref 20.0–24.0)
Drawn by: 235321
O2 Content: 2 L/min
O2 Saturation: 96.8 %
PATIENT TEMPERATURE: 98.6
PCO2 ART: 48.6 mmHg — AB (ref 35.0–45.0)
TCO2: 22.8 mmol/L (ref 0–100)
pH, Arterial: 7.333 — ABNORMAL LOW (ref 7.350–7.450)
pO2, Arterial: 94.1 mmHg (ref 80.0–100.0)

## 2013-12-18 LAB — URINE MICROSCOPIC-ADD ON

## 2013-12-18 LAB — I-STAT TROPONIN, ED: TROPONIN I, POC: 0.01 ng/mL (ref 0.00–0.08)

## 2013-12-18 LAB — PRO B NATRIURETIC PEPTIDE: Pro B Natriuretic peptide (BNP): 2335 pg/mL — ABNORMAL HIGH (ref 0–450)

## 2013-12-18 MED ORDER — ROPINIROLE HCL 1 MG PO TABS
2.0000 mg | ORAL_TABLET | Freq: Every day | ORAL | Status: DC
  Administered 2013-12-19 – 2013-12-21 (×4): 2 mg via ORAL
  Filled 2013-12-18 (×5): qty 2

## 2013-12-18 MED ORDER — AMLODIPINE BESYLATE 2.5 MG PO TABS: 2.5000 mg | ORAL_TABLET | Freq: Every day | ORAL | Status: DC

## 2013-12-18 MED ORDER — ONDANSETRON HCL 4 MG/2ML IJ SOLN
4.0000 mg | Freq: Four times a day (QID) | INTRAMUSCULAR | Status: DC | PRN
  Administered 2013-12-19 – 2013-12-21 (×2): 4 mg via INTRAVENOUS
  Filled 2013-12-18 (×2): qty 2

## 2013-12-18 MED ORDER — BISACODYL 10 MG RE SUPP
10.0000 mg | Freq: Every day | RECTAL | Status: DC | PRN
  Administered 2013-12-21: 10 mg via RECTAL
  Filled 2013-12-18: qty 1

## 2013-12-18 MED ORDER — LABETALOL HCL 5 MG/ML IV SOLN
0.5000 mg/min | INTRAVENOUS | Status: DC
Start: 2013-12-18 — End: 2013-12-18

## 2013-12-18 MED ORDER — ROPINIROLE HCL 1 MG PO TABS: 1.0000 mg | ORAL_TABLET | ORAL | Status: DC

## 2013-12-18 MED ORDER — CIPROFLOXACIN IN D5W 400 MG/200ML IV SOLN
400.0000 mg | Freq: Once | INTRAVENOUS | Status: AC
  Administered 2013-12-18: 400 mg via INTRAVENOUS
  Filled 2013-12-18: qty 200

## 2013-12-18 MED ORDER — LOSARTAN POTASSIUM 50 MG PO TABS: 100.0000 mg | ORAL_TABLET | Freq: Every day | ORAL | Status: DC

## 2013-12-18 MED ORDER — PENTOSAN POLYSULFATE SODIUM 100 MG PO CAPS
100.0000 mg | ORAL_CAPSULE | Freq: Three times a day (TID) | ORAL | Status: DC
  Administered 2013-12-19 – 2013-12-22 (×11): 100 mg via ORAL
  Filled 2013-12-18 (×13): qty 1

## 2013-12-18 MED ORDER — ROPINIROLE HCL 1 MG PO TABS
1.0000 mg | ORAL_TABLET | Freq: Every day | ORAL | Status: DC
  Administered 2013-12-19 – 2013-12-22 (×4): 1 mg via ORAL
  Filled 2013-12-18 (×4): qty 1

## 2013-12-18 MED ORDER — LEVOTHYROXINE SODIUM 50 MCG PO TABS
50.0000 ug | ORAL_TABLET | Freq: Every day | ORAL | Status: DC
  Administered 2013-12-19 – 2013-12-22 (×4): 50 ug via ORAL
  Filled 2013-12-18 (×5): qty 1

## 2013-12-18 MED ORDER — ACETAMINOPHEN 500 MG PO TABS
1000.0000 mg | ORAL_TABLET | Freq: Every day | ORAL | Status: DC
  Administered 2013-12-19 – 2013-12-21 (×3): 1000 mg via ORAL
  Filled 2013-12-18 (×5): qty 2

## 2013-12-18 MED ORDER — MONTELUKAST SODIUM 10 MG PO TABS
10.0000 mg | ORAL_TABLET | Freq: Every evening | ORAL | Status: DC
  Administered 2013-12-19 – 2013-12-21 (×4): 10 mg via ORAL
  Filled 2013-12-18 (×5): qty 1

## 2013-12-18 MED ORDER — CIPROFLOXACIN IN D5W 400 MG/200ML IV SOLN
400.0000 mg | Freq: Two times a day (BID) | INTRAVENOUS | Status: DC
  Administered 2013-12-19: 400 mg via INTRAVENOUS
  Filled 2013-12-18 (×2): qty 200

## 2013-12-18 MED ORDER — ONDANSETRON HCL 4 MG PO TABS: 4.0000 mg | ORAL_TABLET | Freq: Four times a day (QID) | ORAL | Status: DC | PRN

## 2013-12-18 MED ORDER — ASPIRIN 81 MG PO CHEW
81.0000 mg | CHEWABLE_TABLET | Freq: Every day | ORAL | Status: DC
  Administered 2013-12-19 – 2013-12-22 (×5): 81 mg via ORAL
  Filled 2013-12-18 (×5): qty 1

## 2013-12-18 MED ORDER — ALUM & MAG HYDROXIDE-SIMETH 200-200-20 MG/5ML PO SUSP: 30.0000 mL | Freq: Four times a day (QID) | ORAL | Status: DC | PRN

## 2013-12-18 MED ORDER — SENNOSIDES-DOCUSATE SODIUM 8.6-50 MG PO TABS: 1.0000 | ORAL_TABLET | Freq: Every evening | ORAL | Status: DC | PRN

## 2013-12-18 MED ORDER — SODIUM CHLORIDE 0.9 % IV SOLN
INTRAVENOUS | Status: DC
  Administered 2013-12-19: 01:00:00 via INTRAVENOUS

## 2013-12-18 MED ORDER — ENOXAPARIN SODIUM 40 MG/0.4ML ~~LOC~~ SOLN: 40.0000 mg | SUBCUTANEOUS | Status: DC

## 2013-12-18 MED ORDER — QUETIAPINE FUMARATE 25 MG PO TABS
25.0000 mg | ORAL_TABLET | Freq: Every day | ORAL | Status: DC
Start: 2013-12-18 — End: 2013-12-22
  Administered 2013-12-19 – 2013-12-21 (×4): 25 mg via ORAL
  Filled 2013-12-18 (×5): qty 1

## 2013-12-18 MED ORDER — FAMOTIDINE 20 MG PO TABS
20.0000 mg | ORAL_TABLET | Freq: Two times a day (BID) | ORAL | Status: DC
  Administered 2013-12-19 (×3): 20 mg via ORAL
  Filled 2013-12-18 (×5): qty 1

## 2013-12-18 MED ORDER — CLONIDINE HCL 0.1 MG PO TABS
0.2000 mg | ORAL_TABLET | Freq: Two times a day (BID) | ORAL | Status: DC
  Administered 2013-12-18: 0.2 mg via ORAL
  Filled 2013-12-18: qty 2

## 2013-12-18 MED ORDER — SODIUM CHLORIDE 0.9 % IJ SOLN
3.0000 mL | Freq: Two times a day (BID) | INTRAMUSCULAR | Status: DC
  Administered 2013-12-19 – 2013-12-22 (×3): 3 mL via INTRAVENOUS

## 2013-12-18 MED ORDER — ALBUTEROL SULFATE (2.5 MG/3ML) 0.083% IN NEBU: 2.5000 mg | INHALATION_SOLUTION | RESPIRATORY_TRACT | Status: DC | PRN

## 2013-12-18 MED ORDER — SERTRALINE HCL 25 MG PO TABS
25.0000 mg | ORAL_TABLET | Freq: Every day | ORAL | Status: DC
  Administered 2013-12-19 – 2013-12-22 (×4): 25 mg via ORAL
  Filled 2013-12-18 (×4): qty 1

## 2013-12-18 MED ORDER — NICARDIPINE HCL IN NACL 20-0.86 MG/200ML-% IV SOLN
3.0000 mg/h | INTRAVENOUS | Status: DC
  Administered 2013-12-19: 5 mg/h via INTRAVENOUS
  Filled 2013-12-18: qty 200

## 2013-12-18 MED ORDER — LORAZEPAM 1 MG PO TABS
1.0000 mg | ORAL_TABLET | Freq: Two times a day (BID) | ORAL | Status: DC
  Administered 2013-12-19 – 2013-12-20 (×5): 1 mg via ORAL
  Filled 2013-12-18 (×5): qty 1

## 2013-12-18 MED ORDER — HYDRALAZINE HCL 20 MG/ML IJ SOLN
10.0000 mg | Freq: Once | INTRAMUSCULAR | Status: AC
  Administered 2013-12-18: 10 mg via INTRAVENOUS
  Filled 2013-12-18: qty 0.5

## 2013-12-18 MED ORDER — ACETAMINOPHEN 325 MG PO TABS
650.0000 mg | ORAL_TABLET | Freq: Four times a day (QID) | ORAL | Status: DC | PRN
  Administered 2013-12-19 – 2013-12-22 (×5): 650 mg via ORAL
  Filled 2013-12-18 (×5): qty 2

## 2013-12-18 MED ORDER — ACETAMINOPHEN 650 MG RE SUPP: 650.0000 mg | Freq: Four times a day (QID) | RECTAL | Status: DC | PRN

## 2013-12-18 NOTE — ED Provider Notes (Signed)
CSN: [id number]     Arrival date & time 12/18/13  1753 History   First MD Initiated Contact with Patient 12/18/13 1808     Chief Complaint  Patient presents with  . Urinary Incontinence    HPI Patient presents to the emergency room with complaints of urinary incontinence. The patient has had a history of this but over the last several days she has had worsening symptoms. She went and saw her doctor earlier this week and apparently had blood and urine tests. Patient had worsening symptoms today says she was sent to the emergency room for further evaluation. The family is concerned that she was starting to get dehydrated. Patient denies any abdominal pain. She denies any back pain. She denies any numbness or weakness. She does have a history of interstitial cystitis but has not seen her urologist recently. Past Medical History  Diagnosis Date  . Hypertension   . Bowel obstruction 1987  . Hypothyroidism   . Interstitial cystitis   . Hypercholesteremia   . Headache(784.0)   . Anxiety   . Depression   . Multiple respiratory allergies   . Restless leg syndrome   . Stroke 06/2002    right parietal nonhemorrhagic acute infarction  . Abdominal mass   . Fibromyalgia   . Parkinson's disease     dx'd 1990's; told she doesn't have it ~ 2000  . Bradycardia, sinus    Past Surgical History  Procedure Laterality Date  . Abdominal surgery  1970's    "for blockage"  . Cystoscopy with urethral dilatation  02/2006; 07/2005; 01/2005  . Cholecystectomy    . Tonsillectomy    . Appendectomy    . Abdominal hysterectomy    . Dilation and curettage of uterus  1940's  . Shoulder surgery  1970's    "for fx"   History reviewed. No pertinent family history. History  Substance Use Topics  . Smoking status: Never Smoker   . Smokeless tobacco: Never Used  . Alcohol Use: No   OB History   Grav Para Term Preterm Abortions TAB SAB Ect Mult Living                 Review of Systems  All other systems  reviewed and are negative.      Allergies  Compazine; Penicillins; Sulfa antibiotics; and Iohexol  Home Medications   Current Outpatient Rx  Name  Route  Sig  Dispense  Refill  . acetaminophen (TYLENOL) 500 MG tablet   Oral   Take 1,000 mg by mouth at bedtime.         Marland Kitchen amLODipine (NORVASC) 2.5 MG tablet   Oral   Take 2.5 mg by mouth daily.         Marland Kitchen aspirin 81 MG tablet   Oral   Take 81 mg by mouth daily.         . cloNIDine (CATAPRES) 0.1 MG tablet   Oral   Take 0.2 mg by mouth 2 (two) times daily.         . famotidine (PEPCID) 20 MG tablet   Oral   Take 20 mg by mouth 2 (two) times daily.           Marland Kitchen levothyroxine (SYNTHROID, LEVOTHROID) 50 MCG tablet   Oral   Take 50 mcg by mouth daily.           Marland Kitchen LORazepam (ATIVAN) 1 MG tablet   Oral   Take 1 mg by mouth 2 (two) times daily.         Marland Kitchen  losartan (COZAAR) 100 MG tablet   Oral   Take 100 mg by mouth daily.         . montelukast (SINGULAIR) 10 MG tablet   Oral   Take 10 mg by mouth every evening.          . pentosan polysulfate (ELMIRON) 100 MG capsule   Oral   Take 100 mg by mouth 3 (three) times daily before meals.           Marland Kitchen. QUEtiapine (SEROQUEL) 25 MG tablet   Oral   Take 25 mg by mouth at bedtime.          Marland Kitchen. rOPINIRole (REQUIP) 1 MG tablet   Oral   Take 1-2 mg by mouth See admin instructions. Take 1 tablet every morning Take 2 tablets at bedtime         . sertraline (ZOLOFT) 25 MG tablet   Oral   Take 25 mg by mouth daily.          BP 210/79  Pulse 50  Temp(Src) 98.2 F (36.8 C) (Oral)  SpO2 89% Physical Exam  Nursing note and vitals reviewed. Constitutional: No distress.  HENT:  Head: Normocephalic and atraumatic.  Right Ear: External ear normal.  Left Ear: External ear normal.  Mouth/Throat: No oropharyngeal exudate.  Eyes: Conjunctivae are normal. Right eye exhibits no discharge. Left eye exhibits no discharge. No scleral icterus.  Neck: Neck supple.  No tracheal deviation present.  Cardiovascular: Normal rate, regular rhythm and intact distal pulses.   Pulmonary/Chest: Effort normal and breath sounds normal. No stridor. No respiratory distress. She has no wheezes. She has no rales.  Abdominal: Soft. Bowel sounds are normal. She exhibits no distension. There is no tenderness. There is no rebound and no guarding.  Genitourinary:  Normal rectal tone, no fecal impaction  Musculoskeletal: She exhibits no edema and no tenderness.  Neurological: She is alert. She has normal strength. No cranial nerve deficit (no facial droop, extraocular movements intact, no slurred speech) or sensory deficit. She exhibits normal muscle tone. She displays no seizure activity. Coordination normal.  Skin: Skin is warm and dry. No rash noted. She is not diaphoretic.  Psychiatric: She has a normal mood and affect.    ED Course  Procedures (including critical care time) Labs Review Labs Reviewed  CBC WITH DIFFERENTIAL - Abnormal; Notable for the following:    Eosinophils Relative 9 (*)    All other components within normal limits  BASIC METABOLIC PANEL - Abnormal; Notable for the following:    Glucose, Bld 101 (*)    Creatinine, Ser 1.51 (*)    GFR calc non Af Amer 29 (*)    GFR calc Af Amer 34 (*)    All other components within normal limits  URINALYSIS, ROUTINE W REFLEX MICROSCOPIC - Abnormal; Notable for the following:    Hgb urine dipstick TRACE (*)    Protein, ur 100 (*)    Leukocytes, UA TRACE (*)    All other components within normal limits  PRO B NATRIURETIC PEPTIDE - Abnormal; Notable for the following:    Pro B Natriuretic peptide (BNP) 2335.0 (*)    All other components within normal limits  URINE MICROSCOPIC-ADD ON - Abnormal; Notable for the following:    Bacteria, UA FEW (*)    All other components within normal limits  URINE CULTURE  I-STAT TROPOININ, ED   Imaging Review Dg Chest 2 View  12/18/2013   CLINICAL DATA:  Shortness of  breath, weakness  EXAM: CHEST  2 VIEW  COMPARISON:  03/26/2013  FINDINGS: Low lung volumes. Chronic interstitial markings/emphysematous changes. No focal consolidation. No pleural effusion or pneumothorax.  Stable eventration of the right hemidiaphragm.  Mild cardiomegaly.  Degenerative changes of the visualized thoracolumbar spine.  Postsurgical changes with suspected nonunion in the right shoulder, unchanged.  IMPRESSION: No evidence of acute cardiopulmonary disease.   Electronically Signed   By: Charline Bills M.D.   On: 12/18/2013 19:58     EKG Interpretation   Date/Time:  Friday December 18 2013 18:56:01 EDT Ventricular Rate:  46 PR Interval:  247 QRS Duration: 92 QT Interval:  533 QTC Calculation: 466 R Axis:   -9 Text Interpretation:  Sinus or ectopic atrial bradycardia Prolonged PR  interval , new since last ECG Confirmed by Chimamanda Siegfried  MD-J, Kaleyah Labreck ([id number]) on  12/18/2013 7:02:00 PM     1846  Bedside 02 in the low 90s.  Pt denies resp symptoms.  Will check bnp, trop, cxr. MDM   Final diagnoses:  UTI (urinary tract infection)  Elevated brain natriuretic peptide (BNP) level    UA consistent with UTI.  Likely the cause of her incontinence.  Pt has had borderline oxygen saturations..  CXR without edema or pneumonia.  Pt is asymptomatic.  Increased bnp. ?CHF Will consult with hospitalist regarding admission for her uti and evaluation of her borderline oxygenation.    Celene Kras, MD 12/18/13 2127

## 2013-12-18 NOTE — H&P (Signed)
[facility] History and Physical  Leslie [name] HYQ:[id number] DOB: 06-18-1922 DOA: 12/18/2013  Referring physician: Dr. Linwood Dibbles PCP: Ginette Otto, MD   Chief Complaint: Urinary incontinence  HPI: Leslie [name] is a 80 y.o. female  With history of interstitial cystitis, hypothyroidism, fibromyalgia, hypertension, depression, hyperlipidemia, anxiety, history of prior CVA, history of sinus bradycardia and presented to the ED with a 2 day history of urinary incontinence, generalized weakness to the point patient could barely walk. Per patient's daughter the patient's clonidine was recently increased to 0.2 mg twice daily secondary to elevated blood pressures. Family was concerned that patient was getting dehydrated and patient was subsequently brought to the ED. Patient denies any fevers, no chills, no nausea, no vomiting, no chest pain, no shortness of breath, no abdominal pain, no constipation, no cough, no upper rest for his symptoms. Per daughter patient has had a chronic history of alternating between constipation and diarrhea and patient has been having some loose stools over the past few days. Patient has not recently been on any antibiotics. Per daughter patient's blood pressure systolically usually runs in the 160s to 170s. Patient was seen in the emergency room her systolic blood pressure was noted to range from 210-250. Urinalysis which was done was nitrite negative trace leukocytes 7-10 WBCs and a few bacteria. Basic metabolic profile and a creatinine of 1.51 otherwise was within normal limits. Point-of-care troponin was 0.01. Pro BNP was 2335. CBC done was within normal limits. Chest x-ray done was negative for any acute infiltrates. EKG showed a sinus bradycardia with a first-degree AV block. Patient was given IV ciprofloxacin we were called to admit the patient for further evaluation and management.   Review of Systems: As per history of present illness otherwise  negative. Constitutional:  No weight loss, night sweats, Fevers, chills, fatigue.  HEENT:  No headaches, Difficulty swallowing,Tooth/dental problems,Sore throat,  No sneezing, itching, ear ache, nasal congestion, post nasal drip,  Cardio-vascular:  No chest pain, Orthopnea, PND, swelling in lower extremities, anasarca, dizziness, palpitations  GI:  No heartburn, indigestion, abdominal pain, nausea, vomiting, diarrhea, change in bowel habits, loss of appetite  Resp:  No shortness of breath with exertion or at rest. No excess mucus, no productive cough, No non-productive cough, No coughing up of blood.No change in color of mucus.No wheezing.No chest wall deformity  Skin:  no rash or lesions.  GU:  no dysuria, change in color of urine, no urgency or frequency. No flank pain.  Musculoskeletal:  No joint pain or swelling. No decreased range of motion. No back pain.  Psych:  No change in mood or affect. No depression or anxiety. No memory loss.   Past Medical History  Diagnosis Date  . Hypertension   . Bowel obstruction 1987  . Hypothyroidism   . Interstitial cystitis   . Hypercholesteremia   . Headache(784.0)   . Anxiety   . Depression   . Multiple respiratory allergies   . Restless leg syndrome   . Stroke 06/2002    right parietal nonhemorrhagic acute infarction  . Abdominal mass   . Fibromyalgia   . Parkinson's disease     dx'd 1990's; told she doesn't have it ~ 2000  . Bradycardia, sinus   . CKD (chronic kidney disease): baseline cr 1.3-1.5 12/18/2013   Past Surgical History  Procedure Laterality Date  . Abdominal surgery  1970's    "for blockage"  . Cystoscopy with urethral dilatation  02/2006; 07/2005; 01/2005  . Cholecystectomy    .  Tonsillectomy    . Appendectomy    . Abdominal hysterectomy    . Dilation and curettage of uterus  1940's  . Shoulder surgery  1970's    "for fx"   Social History:  reports that she has never smoked. She has never used smokeless  tobacco. She reports that she does not drink alcohol or use illicit drugs.  Allergies  Allergen Reactions  . Compazine     hives  . Penicillins     rash  . Sulfa Antibiotics     rash  . Iohexol Rash    Patient states last time she had "CT contrast" she got a rash.    History reviewed. No pertinent family history.   Prior to Admission medications   Medication Sig Start Date End Date Taking? Authorizing Provider  acetaminophen (TYLENOL) 500 MG tablet Take 1,000 mg by mouth at bedtime.   Yes Historical Provider, MD  amLODipine (NORVASC) 2.5 MG tablet Take 2.5 mg by mouth daily.   Yes Historical Provider, MD  aspirin 81 MG tablet Take 81 mg by mouth daily.   Yes Historical Provider, MD  cloNIDine (CATAPRES) 0.1 MG tablet Take 0.2 mg by mouth 2 (two) times daily.   Yes Historical Provider, MD  famotidine (PEPCID) 20 MG tablet Take 20 mg by mouth 2 (two) times daily.     Yes Historical Provider, MD  levothyroxine (SYNTHROID, LEVOTHROID) 50 MCG tablet Take 50 mcg by mouth daily.     Yes Historical Provider, MD  LORazepam (ATIVAN) 1 MG tablet Take 1 mg by mouth 2 (two) times daily.   Yes Historical Provider, MD  losartan (COZAAR) 100 MG tablet Take 100 mg by mouth daily.   Yes Historical Provider, MD  montelukast (SINGULAIR) 10 MG tablet Take 10 mg by mouth every evening.    Yes Historical Provider, MD  pentosan polysulfate (ELMIRON) 100 MG capsule Take 100 mg by mouth 3 (three) times daily before meals.     Yes Historical Provider, MD  QUEtiapine (SEROQUEL) 25 MG tablet Take 25 mg by mouth at bedtime.    Yes Historical Provider, MD  rOPINIRole (REQUIP) 1 MG tablet Take 1-2 mg by mouth See admin instructions. Take 1 tablet every morning Take 2 tablets at bedtime   Yes Historical Provider, MD  sertraline (ZOLOFT) 25 MG tablet Take 25 mg by mouth daily.   Yes Historical Provider, MD   Physical Exam: Filed Vitals:   12/18/13 2222  BP: 248/80  Pulse: 48  Temp: 98 F (36.7 C)  Resp: 15     BP 248/80  Pulse 48  Temp(Src) 98 F (36.7 C) (Oral)  Resp 15  SpO2 99%  General:  Elderly female pleasant lying on gurney in no acute cardiopulmonary distress. Eyes: PERRLA, EOMI, normal lids, irises & conjunctiva ENT: grossly normal hearing, lips & tongue. Oropharynx is clear, no lesions, no exudates. Dry mucous membranes. Neck: no LAD, masses or thyromegaly Cardiovascular: Bradycardic, no m/r/g. No LE edema. Telemetry: Sinus bradycardia Respiratory: CTA bilaterally, no w/r/r. Normal respiratory effort. Abdomen: soft, ntnd, positive bowel sounds, no rebound, no guarding. Skin: no rash or induration seen on limited exam Musculoskeletal: grossly normal tone BUE/BLE Psychiatric: grossly normal mood and affect, speech fluent and appropriate Neurologic: Alert and oriented x3. Cranial nerves II through XII are grossly intact. Sensation is intact. No focal deficits.           Labs on Admission:  Basic Metabolic Panel:  Recent Labs Lab 12/18/13 1830  NA 143  K 4.5  CL 108  CO2 19  GLUCOSE 101*  BUN 21  CREATININE 1.51*  CALCIUM 8.6   Liver Function Tests: No results found for this basename: AST, ALT, ALKPHOS, BILITOT, PROT, ALBUMIN,  in the last 168 hours No results found for this basename: LIPASE, AMYLASE,  in the last 168 hours No results found for this basename: AMMONIA,  in the last 168 hours CBC:  Recent Labs Lab 12/18/13 1830  WBC 6.8  NEUTROABS 4.1  HGB 12.4  HCT 39.1  MCV 91.6  PLT 181   Cardiac Enzymes: No results found for this basename: CKTOTAL, CKMB, CKMBINDEX, TROPONINI,  in the last 168 hours  BNP (last 3 results)  Recent Labs  12/18/13 1846  PROBNP 2335.0*   CBG: No results found for this basename: GLUCAP,  in the last 168 hours  Radiological Exams on Admission: Dg Chest 2 View  12/18/2013   CLINICAL DATA:  Shortness of breath, weakness  EXAM: CHEST  2 VIEW  COMPARISON:  03/26/2013  FINDINGS: Low lung volumes. Chronic interstitial  markings/emphysematous changes. No focal consolidation. No pleural effusion or pneumothorax.  Stable eventration of the right hemidiaphragm.  Mild cardiomegaly.  Degenerative changes of the visualized thoracolumbar spine.  Postsurgical changes with suspected nonunion in the right shoulder, unchanged.  IMPRESSION: No evidence of acute cardiopulmonary disease.   Electronically Signed   By: Charline BillsSriyesh  Krishnan M.D.   On: 12/18/2013 19:58    EKG: Independently reviewed. Sinus bradycardia. First degree AV block.  Assessment/Plan Principal Problem:   HTN (hypertension), malignant Active Problems:   Weakness generalized   Hypothyroidism   UTI (lower urinary tract infection)   Dehydration   Bradycardia, sinus   Hypoxia   CKD (chronic kidney disease): baseline cr 1.3-1.5  #1 malignant hypertensive urgency Questionable etiology. Per daughter patient's systolic blood pressure usually ranges in the 160 to 170s however recently secondary to elevated blood pressure readings in the 200s PCP and increase patient's clonidine. Patient is noted in the emergency room to have systolic blood pressures ranging from the 2 tendons to the 250s. Admit to step down unit. Basic metabolic profile with creatinine of 1.51 otherwise was within normal limits. CBC is within normal limits. EKG shows a sinus bradycardia with a first-degree AV block. Urinalysis with 100 protein and trace leukocytes 7-10 WBCs and few bacteria. Will place patient on a nicardipine drip, and titrate for both systolic blood pressure 160s to 170s. Follow.  #2 probable UTI Patient with worsening urinary incontinence with trace leukocytes 7-10 WBCs and few bacteria. Per daughter patient has had 3 urinary tract infections this year and a history of interstitial cystitis. Urine cultures are pending. Place on IV ciprofloxacin until urine cultures are back and then narrow antibiotics. Patient will likely need to followup with urology as outpatient. Patient sees  Dr. Isabel CapriceGrapey.  #3 generalized weakness Questionable etiology. We'll cycle cardiac enzymes every 6 hours x3. EKG shows a sinus bradycardia with first degree AV block. Check a 2-D echo. Check a TSH. Patient is not anemic per CBC. UTI with probable urinary tract infection. Place on IV ciprofloxacin. Gentle hydration. PT/OT.  #4 dehydration Gentle hydration.  #5 sinus bradycardia with first-degree AV block Per daughter patient heart rate has been as low as 32 while in the emergency room. Patient does complain of generalized weakness otherwise no chest pain and no shortness of breath. Will discontinue patient's clonidine for now. Patient is no longer on a beta blocker. Cycle cardiac enzymes every 6 hours  x3. Check a 2-D echo. Follow.  #6 hypoxia The ED physician patient's sats dropped into the high 80s on room air. Patient denies any shortness of breath. Chest x-ray is negative. Patient denies any chest pain. During her last hospitalization patient's oxygen saturations was as high as 91%. ABG shows a slight respiratory acidosis with a pH of 7.33 PCO2 of 48 and a PO2 of 94. Monitor and follow.  #7 chronic kidney disease stage III Baseline creatinine 1.3-1.5. Currently around baseline. Follow for now.  #8 hypothyroidism Check a TSH. Continue home dose Synthroid.  #9 prophylaxis Pepcid for GI prophylaxis. Lovenox for DVT prophylaxis.  Code Status: DO NOT RESUSCITATE Family Communication: Updated patient and daughter at bedside. Disposition Plan: Admit to step down unit  Time spent: 65 minutes  Candon Caras M.D. [facility] Pager [phone]

## 2013-12-18 NOTE — ED Notes (Signed)
Bed: WA01 Expected date:  Expected time:  Means of arrival:  Comments: EMS- increased incontinence

## 2013-12-18 NOTE — ED Notes (Signed)
Dr. Lynelle DoctorKnapp made aware of pt's elevated BP.

## 2013-12-18 NOTE — ED Notes (Signed)
Pt states she has been having increasing urinary incontinence for several weeks.

## 2013-12-18 NOTE — ED Notes (Signed)
Pt presents by EMS from [facility] with c/o urinary incontinence. Pt has had a problem with incontinence but pt reports that it has gotten worse over the last few days. Per EMS, pt has not control over her bladder. Recently at her doctor three days for same, hx of recurring UTI's.

## 2013-12-19 ENCOUNTER — Encounter (HOSPITAL_COMMUNITY): Payer: Self-pay | Admitting: Radiology

## 2013-12-19 ENCOUNTER — Inpatient Hospital Stay (HOSPITAL_COMMUNITY): Payer: Medicare Other

## 2013-12-19 DIAGNOSIS — R5381 Other malaise: Secondary | ICD-10-CM

## 2013-12-19 DIAGNOSIS — R1900 Intra-abdominal and pelvic swelling, mass and lump, unspecified site: Secondary | ICD-10-CM

## 2013-12-19 DIAGNOSIS — I517 Cardiomegaly: Secondary | ICD-10-CM

## 2013-12-19 DIAGNOSIS — R5383 Other fatigue: Secondary | ICD-10-CM

## 2013-12-19 LAB — COMPREHENSIVE METABOLIC PANEL
ALK PHOS: 68 U/L (ref 39–117)
ALT: 10 U/L (ref 0–35)
AST: 13 U/L (ref 0–37)
Albumin: 2.8 g/dL — ABNORMAL LOW (ref 3.5–5.2)
BILIRUBIN TOTAL: 0.4 mg/dL (ref 0.3–1.2)
BUN: 19 mg/dL (ref 6–23)
CO2: 25 meq/L (ref 19–32)
Calcium: 8.5 mg/dL (ref 8.4–10.5)
Chloride: 105 mEq/L (ref 96–112)
Creatinine, Ser: 1.47 mg/dL — ABNORMAL HIGH (ref 0.50–1.10)
GFR calc Af Amer: 35 mL/min — ABNORMAL LOW (ref >90)
GFR, EST NON AFRICAN AMERICAN: 30 mL/min — AB (ref >90)
Glucose, Bld: 112 mg/dL — ABNORMAL HIGH (ref 70–99)
POTASSIUM: 4.2 meq/L (ref 3.7–5.3)
SODIUM: 140 meq/L (ref 137–147)
Total Protein: 6 g/dL (ref 6.0–8.3)

## 2013-12-19 LAB — CBC
HCT: 36.4 % (ref 36.0–46.0)
Hemoglobin: 11.6 g/dL — ABNORMAL LOW (ref 12.0–15.0)
MCH: 29.2 pg (ref 26.0–34.0)
MCHC: 31.9 g/dL (ref 30.0–36.0)
MCV: 91.7 fL (ref 78.0–100.0)
PLATELETS: 184 10*3/uL (ref 150–400)
RBC: 3.97 MIL/uL (ref 3.87–5.11)
RDW: 14.8 % (ref 11.5–15.5)
WBC: 6.8 10*3/uL (ref 4.0–10.5)

## 2013-12-19 LAB — TROPONIN I
Troponin I: <0.3 ng/mL (ref <0.30)
Troponin I: <0.3 ng/mL (ref <0.30)

## 2013-12-19 LAB — TSH: TSH: 3.075 u[IU]/mL (ref 0.350–4.500)

## 2013-12-19 LAB — MAGNESIUM: Magnesium: 1.6 mg/dL (ref 1.5–2.5)

## 2013-12-19 LAB — MRSA PCR SCREENING: MRSA by PCR: INVALID — AB

## 2013-12-19 MED ORDER — HYDRALAZINE HCL 25 MG PO TABS
25.0000 mg | ORAL_TABLET | Freq: Three times a day (TID) | ORAL | Status: DC
  Administered 2013-12-19: 25 mg via ORAL
  Filled 2013-12-19 (×5): qty 1

## 2013-12-19 MED ORDER — HYDRALAZINE HCL 10 MG PO TABS
10.0000 mg | ORAL_TABLET | Freq: Three times a day (TID) | ORAL | Status: DC
  Administered 2013-12-19 (×2): 10 mg via ORAL
  Filled 2013-12-19 (×3): qty 1

## 2013-12-19 MED ORDER — ENOXAPARIN SODIUM 30 MG/0.3ML ~~LOC~~ SOLN
30.0000 mg | SUBCUTANEOUS | Status: DC
  Filled 2013-12-19: qty 0.3

## 2013-12-19 MED ORDER — INFLUENZA VAC SPLIT QUAD 0.5 ML IM SUSP
0.5000 mL | INTRAMUSCULAR | Status: DC
  Filled 2013-12-19 (×2): qty 0.5

## 2013-12-19 MED ORDER — AMLODIPINE BESYLATE 2.5 MG PO TABS
2.5000 mg | ORAL_TABLET | Freq: Every day | ORAL | Status: DC
  Filled 2013-12-19: qty 1

## 2013-12-19 MED ORDER — CIPROFLOXACIN IN D5W 400 MG/200ML IV SOLN
400.0000 mg | INTRAVENOUS | Status: DC
  Administered 2013-12-20: 400 mg via INTRAVENOUS
  Filled 2013-12-19 (×2): qty 200

## 2013-12-19 MED ORDER — PNEUMOCOCCAL VAC POLYVALENT 25 MCG/0.5ML IJ INJ
0.5000 mL | INJECTION | INTRAMUSCULAR | Status: DC
  Filled 2013-12-19 (×2): qty 0.5

## 2013-12-19 MED ORDER — HYDRALAZINE HCL 20 MG/ML IJ SOLN
10.0000 mg | INTRAMUSCULAR | Status: DC | PRN
  Administered 2013-12-19 – 2013-12-21 (×4): 10 mg via INTRAVENOUS
  Filled 2013-12-19: qty 1
  Filled 2013-12-19 (×3): qty 0.5

## 2013-12-19 MED ORDER — CLOTRIMAZOLE 1 % VA CREA
1.0000 | TOPICAL_CREAM | Freq: Every day | VAGINAL | Status: DC
  Administered 2013-12-19 – 2013-12-21 (×3): 1 via VAGINAL
  Filled 2013-12-19: qty 45

## 2013-12-19 NOTE — Progress Notes (Addendum)
[facility] PROGRESS NOTE  Leslie [name] ZOX:[id number] DOB: 1922/02/22 DOA: 12/18/2013 PCP: Ginette Otto, MD  Assessment/Plan  Malignant hypertensive urgency, feeling much better today.  CCB gtt turned off after 1 hour and blood pressures fells to high 90s systolic.  Very labile BPs.  Has been off gtt overnight but probably still having lingering effects of clonidine and hydralazine which were administered last night.   -  Orthostatics -  Renin:aldo ratio -  Serum metanephrines -  Renal artery duplex -  Hold ARB until eval for renal artery stenosis complete (not done on weekends and may need to defer to outpatient) -  Avoid AV nodal blocking medications secondary to bradycardia -  Start hydralazine 10mg  TID for now to titrate -  troponins neg x 3  Sinus bradycardia with first-degree AV block, asymptomatic -  Avoid AV nodal medications (BB, CCB, clonidine) -  F/u ECHO  Abdominal mass, likely represents malignancy and last see on CT in 2013, feels larger and may be responsible for increased urinary incontinence and irregular stooling patterns. -  CT ab/p:  Primary mass appears stable, however, she has a new mass adjacent which is compressing the right ureter and causing severe right hydronephrosis.   -  Screen for carcinoid (urinary 5-HIAA)  Severe right hydronephrosis, however, the right kidney was previously atrophic and her creatinine is at baseline.  This has probably been present for a while.  She does not exhibit evidence of pyelonephritis -  Discussed case with Dr. Retta Diones   Probable UTI in setting of interstitial cystitis.   -  Continue pentosan polysulfate (elmiron) -  F/u ucx -  Continue cipro -  f/u Dr. Isabel [name]  Generalized weakness  -  troponins neg -  ECHO:  EF 60%, no wall motion abl. -  tx possible UTI  -  TSH 3.075 -  PT/OT  Dehydration, BUN and creatinine improved -  D/c IVF  Acute hypoxic respiratory failure, likely primary resp acidosis,  CXR neg -  Does not have high A-a gradient suggestive of PE and not tachycardic -  Wean oxygen as tolerated  Chronic kidney disease stage III, creatinine at baseline of 1.3-1.5 -  Minimize nephrotoxins and renally dose medications  Hypothyroidism  -  TSH ~3 in setting of hypertensive urgency -  Continue home dose Synthroid and repeat TSH in a few weeks  RLS, stable.  Continue seroquel and requip  Diet:  regular Access:  PIV IVF:  off Proph:  SCDs   Code Status: DNR Family Communication: patient alone Disposition Plan: transfer to telemetry   Consultants:  None  Procedures:  CXR   Antibiotics:  cipro 3/27 >>   HPI/Subjective:  States she feels much better.  Headache improved, chest pain resolved.  No longer SOB and nauseated    Objective: Filed Vitals:   12/19/13 0530 12/19/13 0600 12/19/13 0630 12/19/13 0700  BP: 152/49 144/44 147/51 146/52  Pulse: 44 45 40 43  Temp:      TempSrc:      Resp: 18 18 17 17   Height:      Weight:      SpO2: 97% 96% 97% 98%    Intake/Output Summary (Last 24 hours) at 12/19/13 0748 Last data filed at 12/19/13 0700  Gross per 24 hour  Intake 539.58 ml  Output      0 ml  Net 539.58 ml   Filed Weights   12/18/13 2342  Weight: 58.5 kg (128 lb 15.5 oz)  Exam:   General:  CF, No acute distress  HEENT:  NCAT, MMM  Cardiovascular:  Bradycardic, RR, nl S1, S2 no mrg, 2+ pulses, warm extremities  Respiratory:  CTAB, no increased WOB  Abdomen:   NABS, soft, ND, possible mass palpated in the LLQ that is nontender  MSK:   Normal tone and bulk, no LEE  Neuro:  Grossly intact  Data Reviewed: Basic Metabolic Panel:  Recent Labs Lab 12/18/13 1830 12/18/13 2326 12/19/13 0515  NA 143  --  140  K 4.5  --  4.2  CL 108  --  105  CO2 19  --  25  GLUCOSE 101*  --  112*  BUN 21  --  19  CREATININE 1.51*  --  1.47*  CALCIUM 8.6  --  8.5  MG  --  1.6  --    Liver Function Tests:  Recent Labs Lab 12/19/13 0515   AST 13  ALT 10  ALKPHOS 68  BILITOT 0.4  PROT 6.0  ALBUMIN 2.8*   No results found for this basename: LIPASE, AMYLASE,  in the last 168 hours No results found for this basename: AMMONIA,  in the last 168 hours CBC:  Recent Labs Lab 12/18/13 1830 12/19/13 0515  WBC 6.8 6.8  NEUTROABS 4.1  --   HGB 12.4 11.6*  HCT 39.1 36.4  MCV 91.6 91.7  PLT 181 184   Cardiac Enzymes:  Recent Labs Lab 12/18/13 2326 12/19/13 0515  TROPONINI <0.30 <0.30   BNP (last 3 results)  Recent Labs  12/18/13 1846  PROBNP 2335.0*   CBG: No results found for this basename: GLUCAP,  in the last 168 hours  Recent Results (from the past 240 hour(s))  MRSA PCR SCREENING     Status: Abnormal   Collection Time    12/18/13 11:40 PM      Result Value Ref Range Status   MRSA by PCR INVALID RESULTS, SPECIMEN SENT FOR CULTURE (*) NEGATIVE Final   Comment: SPOKE WITH BANKS,T RN [phone] [place],N                The GeneXpert MRSA Assay (FDA     approved for NASAL specimens     only), is one component of a     comprehensive MRSA colonization     surveillance program. It is not     intended to diagnose MRSA     infection nor to guide or     monitor treatment for     MRSA infections.     Studies: Dg Chest 2 View  12/18/2013   CLINICAL DATA:  Shortness of breath, weakness  EXAM: CHEST  2 VIEW  COMPARISON:  03/26/2013  FINDINGS: Low lung volumes. Chronic interstitial markings/emphysematous changes. No focal consolidation. No pleural effusion or pneumothorax.  Stable eventration of the right hemidiaphragm.  Mild cardiomegaly.  Degenerative changes of the visualized thoracolumbar spine.  Postsurgical changes with suspected nonunion in the right shoulder, unchanged.  IMPRESSION: No evidence of acute cardiopulmonary disease.   Electronically Signed   By: Charline Bills M.D.   On: 12/18/2013 19:58    Scheduled Meds: . acetaminophen  1,000 mg Oral QHS  . amLODipine  2.5 mg Oral Daily  .  aspirin  81 mg Oral Daily  . ciprofloxacin  400 mg Intravenous Q12H  . enoxaparin (LOVENOX) injection  30 mg Subcutaneous Q24H  . famotidine  20 mg Oral BID  . levothyroxine  50 mcg Oral QAC breakfast  .  LORazepam  1 mg Oral BID  . montelukast  10 mg Oral QPM  . pentosan polysulfate  100 mg Oral TID AC  . QUEtiapine  25 mg Oral QHS  . rOPINIRole  1 mg Oral Daily  . rOPINIRole  2 mg Oral QHS  . sertraline  25 mg Oral Daily  . sodium chloride  3 mL Intravenous Q12H   Continuous Infusions: . sodium chloride 75 mL/hr at 12/19/13 0041    Principal Problem:   HTN (hypertension), malignant Active Problems:   Weakness generalized   Hypothyroidism   UTI (lower urinary tract infection)   Dehydration   Bradycardia, sinus   Hypoxia   CKD (chronic kidney disease): baseline cr 1.3-1.5    Time spent: 30 min    Darik Massing, [facility] CtrMACKENZIE  [facility] Pager [phone]. If 7PM-7AM, please contact night-coverage at [email or web address], password [facility] 12/19/2013, 7:48 AM  LOS: 1 day

## 2013-12-19 NOTE — Progress Notes (Signed)
Echocardiogram 2D Echocardiogram has been performed.  Estelle GrumblesMyers, Kasee Hantz J 12/19/2013, 8:42 AM

## 2013-12-19 NOTE — Progress Notes (Signed)
PT Cancellation Note  Patient Details Name: Leslie [name] MRN: [id number] DOB: 1922/07/04   Cancelled Treatment:     PT deferred at request of RN; pt prepping for transport to [place].  Will follow in am.   Shakiera Edelson 12/19/2013, 4:01 PM

## 2013-12-19 NOTE — Progress Notes (Signed)
Renal duplex will be completed Monday 12/21/2013. Patient will need to be kept NPO except for medications and water.

## 2013-12-20 DIAGNOSIS — N189 Chronic kidney disease, unspecified: Secondary | ICD-10-CM

## 2013-12-20 DIAGNOSIS — R1900 Intra-abdominal and pelvic swelling, mass and lump, unspecified site: Secondary | ICD-10-CM

## 2013-12-20 LAB — URINE MICROSCOPIC-ADD ON

## 2013-12-20 LAB — CBC
HEMATOCRIT: 38.6 % (ref 36.0–46.0)
Hemoglobin: 12.5 g/dL (ref 12.0–15.0)
MCH: 29.3 pg (ref 26.0–34.0)
MCHC: 32.4 g/dL (ref 30.0–36.0)
MCV: 90.6 fL (ref 78.0–100.0)
Platelets: 229 10*3/uL (ref 150–400)
RBC: 4.26 MIL/uL (ref 3.87–5.11)
RDW: 15.1 % (ref 11.5–15.5)
WBC: 8.1 10*3/uL (ref 4.0–10.5)

## 2013-12-20 LAB — URINALYSIS, ROUTINE W REFLEX MICROSCOPIC
Bilirubin Urine: NEGATIVE
Glucose, UA: NEGATIVE mg/dL
Hgb urine dipstick: NEGATIVE
KETONES UR: NEGATIVE mg/dL
NITRITE: NEGATIVE
Protein, ur: 100 mg/dL — AB
Specific Gravity, Urine: 1.007 (ref 1.005–1.030)
UROBILINOGEN UA: 0.2 mg/dL (ref 0.0–1.0)
pH: 5 (ref 5.0–8.0)

## 2013-12-20 LAB — BASIC METABOLIC PANEL
BUN: 14 mg/dL (ref 6–23)
CHLORIDE: 107 meq/L (ref 96–112)
CO2: 24 mEq/L (ref 19–32)
CREATININE: 1.5 mg/dL — AB (ref 0.50–1.10)
Calcium: 8.5 mg/dL (ref 8.4–10.5)
GFR calc non Af Amer: 30 mL/min — ABNORMAL LOW (ref >90)
GFR, EST AFRICAN AMERICAN: 34 mL/min — AB (ref >90)
GLUCOSE: 103 mg/dL — AB (ref 70–99)
Potassium: 4.3 mEq/L (ref 3.7–5.3)
Sodium: 140 mEq/L (ref 137–147)

## 2013-12-20 LAB — TROPONIN I
Troponin I: <0.3 ng/mL (ref <0.30)
Troponin I: <0.3 ng/mL (ref <0.30)

## 2013-12-20 MED ORDER — AMLODIPINE BESYLATE 2.5 MG PO TABS
2.5000 mg | ORAL_TABLET | Freq: Every day | ORAL | Status: DC
  Administered 2013-12-20: 2.5 mg via ORAL
  Filled 2013-12-20: qty 1

## 2013-12-20 MED ORDER — LOSARTAN POTASSIUM 50 MG PO TABS
100.0000 mg | ORAL_TABLET | Freq: Every day | ORAL | Status: DC
  Administered 2013-12-20 – 2013-12-22 (×3): 100 mg via ORAL
  Filled 2013-12-20 (×3): qty 2

## 2013-12-20 MED ORDER — LORAZEPAM 2 MG/ML IJ SOLN
0.5000 mg | INTRAMUSCULAR | Status: DC | PRN
  Administered 2013-12-20 – 2013-12-21 (×2): 1 mg via INTRAVENOUS
  Filled 2013-12-20 (×2): qty 1

## 2013-12-20 MED ORDER — HYDRALAZINE HCL 25 MG PO TABS
25.0000 mg | ORAL_TABLET | Freq: Three times a day (TID) | ORAL | Status: DC
  Administered 2013-12-20 – 2013-12-22 (×6): 25 mg via ORAL
  Filled 2013-12-20 (×8): qty 1

## 2013-12-20 MED ORDER — AMLODIPINE BESYLATE 2.5 MG PO TABS
2.5000 mg | ORAL_TABLET | Freq: Once | ORAL | Status: AC
  Administered 2013-12-20: 2.5 mg via ORAL
  Filled 2013-12-20: qty 1

## 2013-12-20 MED ORDER — FAMOTIDINE 20 MG PO TABS
20.0000 mg | ORAL_TABLET | Freq: Every day | ORAL | Status: DC
  Administered 2013-12-21 – 2013-12-22 (×2): 20 mg via ORAL
  Filled 2013-12-20 (×2): qty 1

## 2013-12-20 MED ORDER — HYDRALAZINE HCL 10 MG PO TABS
10.0000 mg | ORAL_TABLET | Freq: Three times a day (TID) | ORAL | Status: DC
  Administered 2013-12-20: 10 mg via ORAL
  Filled 2013-12-20 (×3): qty 1

## 2013-12-20 MED ORDER — AMLODIPINE BESYLATE 5 MG PO TABS
5.0000 mg | ORAL_TABLET | Freq: Every day | ORAL | Status: DC
  Filled 2013-12-20: qty 1

## 2013-12-20 MED ORDER — SPIRONOLACTONE 12.5 MG HALF TABLET: 12.5000 mg | ORAL_TABLET | Freq: Every day | ORAL | Status: DC

## 2013-12-20 NOTE — Progress Notes (Signed)
Before 2300 when RN was giving BP meds (BP was obtained and systolic was in the 220s and HR was in the 130s. Paged practitioner who ordered EKG, said to give the PRN hydralazine, and hold PO hydralazine. IV hydralazine was given and BP dropped to systolic 170s and HR slowed to 60/70s in Afib. Practicioner ntified. Practitioner said to give PO hydralazine given. Pts systolic dropped to 150s and HR remains controlled at 60-70s/ Central Tele states that the rhythm changes from 1st degree AV block to Afib all at a rate in the 60/70s. Pt stable and not complaining of palpitations or dizziness. Will continue to monitor.

## 2013-12-20 NOTE — Progress Notes (Signed)
Dr. Malachi BondsShort notified pt shaking, tearful,

## 2013-12-20 NOTE — Progress Notes (Addendum)
[facility] PROGRESS NOTE  Leslie [name] WUJ:[id number] DOB: [birth year] DOA: 12/18/2013 PCP: Ginette Otto, MD  Assessment/Plan  Malignant hypertensive urgency, with labile BPs   -  Orthostatics -  Renin:aldo ratio -  Serum metanephrines -  Renal artery duplex -  Restart ARB and carefully monitor creatinine -  Avoid AV nodal blocking medications secondary to bradycardia -  Continue hydralazine 25 mg TID  -  Add norvasc -  troponins neg x 3  Sinus bradycardia with first-degree AV block, asymptomatic -  Avoid AV nodal medications (BB, CCB, clonidine) -   ECHO:  Ejection fraction 60% with mild LVH, no wall motion abnormalities, left atrium mildly dilated, right ventricle with mildly reduced systolic function  Possible a-flutter /atrial fibrillation overnight, but baseline erratic -  Decrease hydralazine to 10 mg 3 times a day -  Start Norvasc -  Patient is a high falls risk so recommend aspirin prophylaxis  -  See ECHO report above -  TSH wnl -  Troponins neg  Abdominal mass, likely represents malignancy and last see on CT in 2013, feels larger and may be responsible for increased urinary incontinence and irregular stooling patterns. -  CT ab/p:  Primary mass appears stable, however, she has a new mass adjacent which is compressing the right ureter and causing severe right hydronephrosis.   -  F/u urinary 5-HIAA  Severe right hydronephrosis, however, the right kidney was previously atrophic and her creatinine is at baseline.  This has probably been present for a while.  She does not exhibit evidence of pyelonephritis -  Discussed case with Dr. Retta Diones   GNR UTI in setting of interstitial cystitis.   -  Continue pentosan polysulfate (elmiron) -  F/u ucx  -  Continue cipro -  f/u Dr. Isabel [name]  Generalized weakness  -  troponins neg -  ECHO:  EF 60%, no wall motion abl. -  tx possible UTI  -  TSH 3.075 -  PT/OT pending -  Possible SNF placement  Dehydration,  BUN and creatinine improved  Acute hypoxic respiratory failure, likely primary resp acidosis, CXR neg -  Does not have high A-a gradient suggestive of PE and not tachycardic -  Wean oxygen as tolerated  Chronic kidney disease stage III, creatinine at baseline of 1.3-1.5 -  Minimize nephrotoxins and renally dose medications  Hypothyroidism  -  TSH ~3 in setting of hypertensive urgency -  Continue home dose Synthroid and repeat TSH in a few weeks  RLS, stable.  Continue seroquel and requip  Dysuria, likely due to yeast infection from antibiotics -  F/u urine culture -  Start vag creams for yeast infection  Diet:  regular Access:  PIV IVF:  off Proph:  SCDs   Code Status: DNR Family Communication: patient alone Disposition Plan: continue telemetry   Consultants:  None  Procedures:  CXR   Antibiotics:  cipro 3/27 >>   HPI/Subjective:  Possible episode of atrial fibrillation/a-flutter overnight but baseline erratic so hard to discern.  Asymptomatic. Also became transiently hypertensive to 240/58 which resolved with IV hydralazine.  Having dysuria this morning.    Objective: Filed Vitals:   12/19/13 2228 12/19/13 2337 12/20/13 0500 12/20/13 0905  BP: 229/77 156/65 151/70   Pulse:   65   Temp:   98.1 F (36.7 C) 98.6 F (37 C)  TempSrc:   Axillary   Resp:   16   Height:      Weight:   59.784 kg (131 lb 12.8  oz)   SpO2:   95%     Intake/Output Summary (Last 24 hours) at 12/20/13 0921 Last data filed at 12/20/13 4098  Gross per 24 hour  Intake   1445 ml  Output    250 ml  Net   1195 ml   Filed Weights   12/18/13 2342 12/20/13 0500  Weight: 58.5 kg (128 lb 15.5 oz) 59.784 kg (131 lb 12.8 oz)    Exam:   General:  CF, No acute distress, nasal canula in place  HEENT:  NCAT, MMM  Cardiovascular:  IRRR, nl S1, S2 no mrg, 2+ pulses, warm extremities  Respiratory:  CTAB, no increased WOB  Abdomen:   NABS, soft, ND, possible mass palpated in the LLQ  that is nontender  MSK:   Normal tone and bulk, no LEE  Neuro:  Grossly intact  Data Reviewed: Basic Metabolic Panel:  Recent Labs Lab 12/18/13 1830 12/18/13 2326 12/19/13 0515 12/20/13 0442  NA 143  --  140 140  K 4.5  --  4.2 4.3  CL 108  --  105 107  CO2 19  --  25 24  GLUCOSE 101*  --  112* 103*  BUN 21  --  19 14  CREATININE 1.51*  --  1.47* 1.50*  CALCIUM 8.6  --  8.5 8.5  MG  --  1.6  --   --    Liver Function Tests:  Recent Labs Lab 12/19/13 0515  AST 13  ALT 10  ALKPHOS 68  BILITOT 0.4  PROT 6.0  ALBUMIN 2.8*   No results found for this basename: LIPASE, AMYLASE,  in the last 168 hours No results found for this basename: AMMONIA,  in the last 168 hours CBC:  Recent Labs Lab 12/18/13 1830 12/19/13 0515 12/20/13 0442  WBC 6.8 6.8 8.1  NEUTROABS 4.1  --   --   HGB 12.4 11.6* 12.5  HCT 39.1 36.4 38.6  MCV 91.6 91.7 90.6  PLT 181 184 229   Cardiac Enzymes:  Recent Labs Lab 12/18/13 2326 12/19/13 0515 12/19/13 2351 12/20/13 0442  TROPONINI <0.30 <0.30 <0.30 <0.30   BNP (last 3 results)  Recent Labs  12/18/13 1846  PROBNP 2335.0*   CBG: No results found for this basename: GLUCAP,  in the last 168 hours  Recent Results (from the past 240 hour(s))  URINE CULTURE     Status: None   Collection Time    12/18/13  9:10 PM      Result Value Ref Range Status   Specimen Description URINE, CATHETERIZED   Final   Special Requests NONE   Final   Culture  Setup Time     Final   Value: 12/19/2013 03:39     Performed at [facility] Count     Final   Value: >=100,000 COLONIES/ML     Performed at [facility]   Culture     Final   Value: GRAM NEGATIVE RODS     Performed at [facility]   Report Status PENDING   Incomplete  MRSA PCR SCREENING     Status: Abnormal   Collection Time    12/18/13 11:40 PM      Result Value Ref Range Status   MRSA by PCR INVALID RESULTS, SPECIMEN SENT FOR CULTURE (*) NEGATIVE  Final   Comment: SPOKE WITH BANKS,T RN [phone] Marian Sorrow  The GeneXpert MRSA Assay (FDA     approved for NASAL specimens     only), is one component of a     comprehensive MRSA colonization     surveillance program. It is not     intended to diagnose MRSA     infection nor to guide or     monitor treatment for     MRSA infections.     Studies: Ct Abdomen Pelvis Wo Contrast  12/19/2013   CLINICAL DATA:  Stool irregularity, urinary incontinence. History of the abdominal mass. Diffuse abdominal pain.  EXAM: CT ABDOMEN AND PELVIS WITHOUT CONTRAST  TECHNIQUE: Multidetector CT imaging of the abdomen and pelvis was performed following the standard protocol without intravenous contrast.  COMPARISON:  07/05/2011  FINDINGS: There are small bilateral pleural effusions. Areas of atelectasis in the lower lobes bilaterally. Heart is mildly enlarged. Aortic and mitral valve calcifications noted. Diffuse coronary artery calcifications.  Prior cholecystectomy. Intrahepatic and extrahepatic biliary ductal dilatation noted. The common bile duct measures 15 mm. This is unchanged since prior study. Small low-density lesion inferiorly within the right lobe of the liver measures 15 mm, stable. No additional focal abnormalities in the liver. Spleen, pancreas, adrenals have an unremarkable unenhanced appearance.  Severe right hydronephrosis, new since prior study. There is severe atrophy of the right kidney which was present on the prior study and is presumably renovascular. The right ureter is dilated to a soft tissue nodular mass in the right side of the pelvis measuring 2.4 by 1.9 cm. This area is new or enlarged since prior study.  Spiculated appearing soft tissue mass noted in the right lower quadrant mesenteric. This measures 4.6 x 3.2 cm and is similar to prior study. Central calcifications noted.  Small bowel is decompressed. Colon is unremarkable. Moderate-sized hiatal hernia present, stable  since prior study. Stomach otherwise unremarkable.  Aorta and iliac vessels are calcified, non aneurysmal. No free fluid or free air.  Degenerative changes throughout the thoracolumbar spine. No acute bony abnormality.  IMPRESSION: Spiculated appearing mass in the mesenteric in the right lower quadrant with central calcifications and a desmoplastic appearance. Findings suspicious for carcinoid, abdominal desmoid, fibrosing mesenteritis. This is unchanged since 2012.  New soft tissue nodule in the right lower pelvis which is causing right-sided hydronephrosis, severe. However, the right kidney was previously severely atrophic, presumably from renovascular disease. This soft tissue nodule in the pelvis appears contiguous with the right side of the vagina and is of unknown etiology.  Intrahepatic and extrahepatic biliary ductal dilatation.  Small bilateral effusions.   Electronically Signed   By: Charlett NoseKevin  Dover M.D.   On: 12/19/2013 15:11   Dg Chest 2 View  12/18/2013   CLINICAL DATA:  Shortness of breath, weakness  EXAM: CHEST  2 VIEW  COMPARISON:  03/26/2013  FINDINGS: Low lung volumes. Chronic interstitial markings/emphysematous changes. No focal consolidation. No pleural effusion or pneumothorax.  Stable eventration of the right hemidiaphragm.  Mild cardiomegaly.  Degenerative changes of the visualized thoracolumbar spine.  Postsurgical changes with suspected nonunion in the right shoulder, unchanged.  IMPRESSION: No evidence of acute cardiopulmonary disease.   Electronically Signed   By: Charline BillsSriyesh  Krishnan M.D.   On: 12/18/2013 19:58    Scheduled Meds: . acetaminophen  1,000 mg Oral QHS  . amLODipine  2.5 mg Oral Daily  . aspirin  81 mg Oral Daily  . ciprofloxacin  400 mg Intravenous Q24H  . clotrimazole  1 Applicatorful Vaginal QHS  . famotidine  20  mg Oral BID  . hydrALAZINE  10 mg Oral TID  . influenza vac split quadrivalent PF  0.5 mL Intramuscular Tomorrow-1000  . levothyroxine  50 mcg Oral QAC  breakfast  . LORazepam  1 mg Oral BID  . montelukast  10 mg Oral QPM  . pentosan polysulfate  100 mg Oral TID AC  . pneumococcal 23 valent vaccine  0.5 mL Intramuscular Tomorrow-1000  . QUEtiapine  25 mg Oral QHS  . rOPINIRole  1 mg Oral Daily  . rOPINIRole  2 mg Oral QHS  . sertraline  25 mg Oral Daily  . sodium chloride  3 mL Intravenous Q12H   Continuous Infusions:    Principal Problem:   HTN (hypertension), malignant Active Problems:   Weakness generalized   Hypothyroidism   UTI (lower urinary tract infection)   Dehydration   Bradycardia, sinus   Hypoxia   CKD (chronic kidney disease): baseline cr 1.3-1.5   Abdominal mass    Time spent: 30 min    Ceola Para, [facility]  [facility] Pager [phone]. If 7PM-7AM, please contact night-coverage at [email or web address], password [facility] 12/20/2013, 9:21 AM  LOS: 2 days

## 2013-12-20 NOTE — Progress Notes (Signed)
PT Cancellation Note  Patient Details Name: Tilman NeatDorothy W Krol MRN: [id number] DOB: August 31, 1922   Cancelled Treatment:    Reason Eval/Treat Not Completed: Medical issues which prohibited therapy (BP elevated 215/68, defer today -spoke  With RN, see 3/30)   Drucilla ChaletWILLIAMS,Fuad Forget 12/20/2013, 3:27 PM

## 2013-12-21 ENCOUNTER — Inpatient Hospital Stay (HOSPITAL_COMMUNITY): Payer: Medicare Other

## 2013-12-21 DIAGNOSIS — K56609 Unspecified intestinal obstruction, unspecified as to partial versus complete obstruction: Secondary | ICD-10-CM

## 2013-12-21 DIAGNOSIS — I10 Essential (primary) hypertension: Secondary | ICD-10-CM

## 2013-12-21 LAB — CBC
HCT: 41.7 % (ref 36.0–46.0)
Hemoglobin: 13.4 g/dL (ref 12.0–15.0)
MCH: 29.8 pg (ref 26.0–34.0)
MCHC: 32.1 g/dL (ref 30.0–36.0)
MCV: 92.9 fL (ref 78.0–100.0)
Platelets: 230 10*3/uL (ref 150–400)
RBC: 4.49 MIL/uL (ref 3.87–5.11)
RDW: 15.3 % (ref 11.5–15.5)
WBC: 9.1 10*3/uL (ref 4.0–10.5)

## 2013-12-21 LAB — BASIC METABOLIC PANEL
BUN: 13 mg/dL (ref 6–23)
CHLORIDE: 103 meq/L (ref 96–112)
CO2: 23 mEq/L (ref 19–32)
Calcium: 8.9 mg/dL (ref 8.4–10.5)
Creatinine, Ser: 1.42 mg/dL — ABNORMAL HIGH (ref 0.50–1.10)
GFR calc Af Amer: 37 mL/min — ABNORMAL LOW (ref >90)
GFR calc non Af Amer: 32 mL/min — ABNORMAL LOW (ref >90)
GLUCOSE: 103 mg/dL — AB (ref 70–99)
POTASSIUM: 4.4 meq/L (ref 3.7–5.3)
Sodium: 139 mEq/L (ref 137–147)

## 2013-12-21 LAB — URINE CULTURE: Colony Count: >=100000

## 2013-12-21 LAB — MRSA CULTURE

## 2013-12-21 MED ORDER — LORAZEPAM 1 MG PO TABS
1.0000 mg | ORAL_TABLET | Freq: Two times a day (BID) | ORAL | Status: DC | PRN
  Administered 2013-12-21: 1 mg via ORAL

## 2013-12-21 MED ORDER — AMLODIPINE BESYLATE 10 MG PO TABS
10.0000 mg | ORAL_TABLET | Freq: Every day | ORAL | Status: DC
  Administered 2013-12-21: 10 mg via ORAL
  Filled 2013-12-21: qty 1

## 2013-12-21 MED ORDER — CIPROFLOXACIN HCL 500 MG PO TABS
500.0000 mg | ORAL_TABLET | Freq: Every day | ORAL | Status: DC
  Administered 2013-12-21 – 2013-12-22 (×2): 500 mg via ORAL
  Filled 2013-12-21 (×5): qty 1

## 2013-12-21 MED ORDER — LORAZEPAM 1 MG PO TABS
1.0000 mg | ORAL_TABLET | Freq: Three times a day (TID) | ORAL | Status: DC
  Administered 2013-12-21 – 2013-12-22 (×3): 1 mg via ORAL
  Filled 2013-12-21 (×4): qty 1

## 2013-12-21 MED ORDER — NIFEDIPINE ER 60 MG PO TB24
60.0000 mg | ORAL_TABLET | Freq: Every day | ORAL | Status: DC
  Administered 2013-12-21: 60 mg via ORAL
  Filled 2013-12-21 (×2): qty 1

## 2013-12-21 NOTE — Progress Notes (Signed)
Clinical Social Work Department BRIEF PSYCHOSOCIAL ASSESSMENT 12/21/2013  Patient:  Leslie [name], Leslie [name]     Account Number:  [id number]     Admit date:  12/18/2013  Clinical Social Worker:  Venia Minks  Date/Time:  12/21/2013 12:00 M  Referred by:  Physician  Date Referred:  12/21/2013 Referred for  SNF Placement   Other Referral:   Interview type:  Patient Other interview type:    PSYCHOSOCIAL DATA Living Status:  ALONE Admitted from facility:   Level of care:   Primary support name:  Babs Bertin Primary support relationship to patient:  CHILD, ADULT Degree of support available:   good    CURRENT CONCERNS Current Concerns  Post-Acute Placement   Other Concerns:    SOCIAL WORK ASSESSMENT / PLAN CSW met with patient. patient is alert and oriented X3. patient in need of snf placement. Patient states that she has previously been to rehab at [facility] and had a very good experience. she would like to return there.   Assessment/plan status:   Other assessment/ plan:   Information/referral to community resources:    PATIENT'S/FAMILY'S RESPONSE TO PLAN OF CARE: Patient is in agreement that she needs snf and would like to return to [facility]. She is hopeful that with short term snf, she will be able to return to living independently.

## 2013-12-21 NOTE — Evaluation (Signed)
Occupational Therapy Evaluation Patient Details Name: ABBYGAEL CURTISS MRN: [id number] DOB: 1921-11-20 Today's Date: 12/21/2013    History of Present Illness Pt is an 80 year old female admitted with malignant hypertensive urgency from  ALF.  Checked with RN prior to eval and BP 157/58 so RN agreeable to PT   Clinical Impression   Pt presents to OT with decreased I with ADL activity due to problems listed below.  Pt will benefit from skilled OT to increase I with ADL activity     Follow Up Recommendations  SNF    Equipment Recommendations  None recommended by OT       Precautions / Restrictions Precautions Precautions: Fall Precaution Comments: monitor BP      Mobility Bed Mobility Overal bed mobility: Needs Assistance Bed Mobility: Supine to Sit     Supine to sit: Mod assist     General bed mobility comments: verbal cues for technique, mod assist due to weakness, use of bed pad to scoot to EOB  Transfers Overall transfer level: Needs assistance Equipment used: Rolling walker (2 wheeled) Transfers: Sit to/from [employer] Sit to Stand: Min assist Stand pivot transfers: Min guard       General transfer comment: verbal cues for safe technique, pt prefers to pull up from RW so steadied RW (uses rollator with brakes applied)         ADL Eating/Feeding: Minimal assistance;Sitting Grooming: Minimal assistance;Sitting     Lower Body Bathing: Maximal assistance;Sit to/from stand Lower Body Dressing: Maximal assistance;Sit to/from stand Toilet Transfer: Moderate assistance   Tub/ Shower Transfer: Maximal assistance;Stand-pivot Functional mobility during ADLs: Moderate assistance           Extremity/Trunk Assessment Upper Extremity Assessment Upper Extremity Assessment: Generalized weakness;RUE deficits/detail RUE Deficits / Details: R shoulder impaired as pt broke it about 4 years ago . ROm has been impaired since   Lower Extremity  Assessment Lower Extremity Assessment: Generalized weakness       Communication Communication Communication: No difficulties   Cognition Arousal/Alertness: Awake/alert Behavior During Therapy: WFL for tasks assessed/performed Overall Cognitive Status: Within Functional Limits for tasks assessed                             Home Living Family/patient expects to be discharged to:: Other (Comment) (pt lives in apartment and has a lady live with her)                             Home Equipment: Environmental consultant - 4 wheels;Other (comment) (rollator)   Additional Comments: pts apartment building does not provide meals      Prior Functioning/Environment Level of Independence: Independent with assistive device(s)             OT Diagnosis:     OT Problem List: Decreased strength;Decreased activity tolerance;Impaired balance (sitting and/or standing)   OT Treatment/Interventions: Self-care/ADL training;DME and/or AE instruction;Patient/family education    OT Goals(Current goals can be found in the care plan section) Acute Rehab OT Goals Patient Stated Goal: get stronger OT Goal Formulation: With patient Time For Goal Achievement: 01/04/14 Potential to Achieve Goals: Good  OT Frequency: Min 2X/week           End of Session:    Activity Tolerance: Patient tolerated treatment well Patient left: in bed   Time: 1415-1435 OT Time Calculation (min): 20 min Charges:  OT General Charges $  OT Visit: 1 Procedure OT Evaluation $Initial OT Evaluation Tier I: 1 Procedure OT Treatments $Self Care/Home Management : 8-22 mins G-Codes:    Alba CoryEDDING, Syriana Croslin D 12/21/2013, 2:42 PM

## 2013-12-21 NOTE — Evaluation (Signed)
Physical Therapy Evaluation Patient Details Name: Leslie [name] MRN: [id number] DOB: 01-24-1922 Today's Date: 12/21/2013   History of Present Illness  Pt is an 80 year old female admitted with malignant hypertensive urgency from  ALF.  Checked with RN prior to eval and BP 157/58 so RN agreeable to PT  Clinical Impression  Pt currently with functional limitations due to the deficits listed below (see PT Problem List). Pt will benefit from skilled PT to increase their independence and safety with mobility to allow discharge to the venue listed below.  Pt reported increased weakness and felt unable to tolerate ambulation today however agreeable to OOB to recliner.  Pt reports she has some assist at ALF and agreeable to HHPT however if ALF unable to provide assist, pt also agreeable to SNF, states she has been to [facility] a few months ago and enjoyed her rehab there.      Follow Up Recommendations Home health PT (if ALF able to provide assist, otherwise ST-SNF)    Equipment Recommendations  None recommended by PT    Recommendations for Other Services       Precautions / Restrictions Precautions Precautions: Fall Precaution Comments: monitor BP      Mobility  Bed Mobility Overal bed mobility: Needs Assistance Bed Mobility: Supine to Sit     Supine to sit: Mod assist     General bed mobility comments: verbal cues for technique, mod assist due to weakness, use of bed pad to scoot to EOB  Transfers Overall transfer level: Needs assistance Equipment used: Rolling walker (2 wheeled) Transfers: Sit to/from [employer] Sit to Stand: Min assist Stand pivot transfers: Min guard       General transfer comment: verbal cues for safe technique, pt prefers to pull up from RW so steadied RW (uses rollator with brakes applied)  Ambulation/Gait Ambulation/Gait assistance:  (deferred due to pt declined due to weakness)              Stairs             Wheelchair Mobility    Modified Rankin (Stroke Patients Only)       Balance                                     Pertinent Vitals/Pain BP 157/58 mmHg HR 86 (taken just prior to entering room by nsg tech)    Home Living Family/patient expects to be discharged to:: Assisted living               Home Equipment: Walker - 4 wheels;Other (comment) (rollator) Additional Comments: pt states she does not ambulate to dining hall, has meals brought to her apt    Prior Function Level of Independence: Independent with assistive device(s)               Hand Dominance        Extremity/Trunk Assessment   Upper Extremity Assessment: Defer to OT evaluation           Lower Extremity Assessment: Generalized weakness         Communication   Communication: No difficulties  Cognition Arousal/Alertness: Awake/alert Behavior During Therapy: WFL for tasks assessed/performed Overall Cognitive Status: Within Functional Limits for tasks assessed                      General Comments      Exercises  Assessment/Plan    PT Assessment Patient needs continued PT services  PT Diagnosis Generalized weakness;Difficulty walking   PT Problem List Decreased strength;Decreased activity tolerance;Decreased mobility  PT Treatment Interventions DME instruction;Gait training;Functional mobility training;Therapeutic activities;Therapeutic exercise;Patient/family education   PT Goals (Current goals can be found in the Care Plan section) Acute Rehab PT Goals PT Goal Formulation: With patient Time For Goal Achievement: 01/04/14 Potential to Achieve Goals: Good    Frequency Min 3X/week   Barriers to discharge        End of Session Equipment Utilized During Treatment: Gait belt Activity Tolerance: Patient limited by fatigue Patient left: in chair;with chair alarm set;with call bell/phone within reach         Time: [id number] PT Time  Calculation (min): 16 min   Charges:   PT Evaluation $Initial PT Evaluation Tier I: 1 Procedure PT Treatments $Gait Training: 8-22 mins   PT G Codes:          Leslie [name],Leslie [name] 12/21/2013, 1:33 PM Leslie [name], PT, DPT 12/21/2013 Pager: [phone]

## 2013-12-21 NOTE — Progress Notes (Signed)
This patient is receiving the antibiotic Cipro by the intravenous route. Based on criteria approved by the Pharmacy and Therapeutics Committee, and the Infectious Disease Division, the antibiotic(s) is / are being converted to equivalent oral dose form(s). These criteria include: . Patient being treated for a respiratory tract infection, urinary tract infection, cellulitis, or Clostridium Difficile Associated Diarrhea . The patient is not neutropenic and does not exhibit a GI malabsorption state . The patient is eating (either orally or per tube) and/or has been taking other orally administered medications for at least 24 hours. . The patient is improving clinically (physician assessment and a 24-hour Tmax of < 100.5 F).  If you have questions about this conversion, please contact the pharmacy department. Thank you.  Clance BollAmanda Shantal Roan, PharmD, BCPS Pager: [phone] 12/21/2013 8:04 AM

## 2013-12-21 NOTE — Progress Notes (Signed)
OT Cancellation Note  Patient Details Name: Leslie [name] MRN: [id number] DOB: [birth year]   Cancelled Treatment:    Reason Eval/Treat Not Completed: Medical issues which prohibited therapy. Pts BP 209/68. Spoke with Dr Malachi BondsShort in regards to therapy and she did state is head CT normal pt will be able to work with therapy.  Will check back on pt  [name], Metro KungLorraine D 12/21/2013, 10:13 AM

## 2013-12-21 NOTE — Care Management Note (Addendum)
    Page 1 of 1   12/22/2013     10:57:36 AM   CARE MANAGEMENT NOTE 12/22/2013  Patient:  Leslie [name],Leslie [name]   Account Number:  [id number]  Date Initiated:  12/21/2013  Documentation initiated by:  [facility] City[facility]HABIR,Candies Palm  Subjective/Objective Assessment:   80 Y/O F ADMITTED [name]/HTN URGENCY.     Action/Plan:   FROM ALF.HAS PCP,PHARMACY.   Anticipated DC Date:  12/22/2013   Anticipated DC Plan:  SKILLED NURSING FACILITY      DC Planning Services  CM consult      Choice offered to / List presented to:             Status of service:  Completed, signed off Medicare Important Message given?   (If response is "NO", the following Medicare IM given date fields will be blank) Date Medicare IM given:   Date Additional Medicare IM given:    Discharge Disposition:  SKILLED NURSING FACILITY  Per UR Regulation:  Reviewed for med. necessity/level of care/duration of stay  If discussed at Long Length of Stay Meetings, dates discussed:   12/22/2013    Comments:  12/22/13 Leslie Drohan RN,BSN NCM [phone] D/C SNF.  12/21/13 Leslie Ashbaugh RN,BSN NCM [phone] PT-HH/SNF.OT-SNF.D/C PLAN SNF-[facility].

## 2013-12-21 NOTE — Progress Notes (Signed)
VASCULAR LAB   Renal artery duplex attempted.   Unable to visualize kidneys or arteries due to bowel and gas interference.    Neytiri Asche, RVT 12/21/2013, 9:22 AM

## 2013-12-21 NOTE — Progress Notes (Addendum)
[facility] PROGRESS NOTE  Leslie [name] ZOX:[id number] DOB: 12-Sep-1922 DOA: 12/18/2013 PCP: Ginette Otto, MD  Assessment/Plan  Malignant hypertensive urgency, with labile BPs, persistent headache.  Discussed case with Dr. Allena Katz from nephrology -  Orthostatics -  Renin:aldo ratio pending -  Serum metanephrines pending -  Renal artery duplex:  Could not be completed due to bowel gas -  Creatinine stable to improved after starting losartan -  Monitor potassium carefully -  Avoid AV nodal blocking medications secondary to bradycardia -  Continue hydralazine 25 mg TID  -  D/c norvasc -  Start nifedipine XL 60mg  qhs -  troponins neg x 3 -  CT head without contrast -  If head CT negative, okay to work with PT.  Sinus bradycardia with first-degree AV block, asymptomatic -  Avoid AV nodal medications (BB, CCB, clonidine) -   ECHO:  Ejection fraction 60% with mild LVH, no wall motion abnormalities, left atrium mildly dilated, right ventricle with mildly reduced systolic function  Possible a-flutter /atrial fibrillation, but baseline on telemetry was erratic. No further episodes overnight but having some Jonte Shiller runs of SVT -  Maximize norvasc -  Patient is a high falls risk so recommend aspirin prophylaxis  -  See ECHO report above -  TSH wnl -  Troponins neg  Abdominal mass, likely represents malignancy and last see on CT in 2013, feels larger and may be responsible for increased urinary incontinence and irregular stooling patterns. -  CT ab/p:  Primary mass appears stable, however, she has a new mass adjacent which is compressing the right ureter and causing severe right hydronephrosis.   -  F/u urinary 5-HIAA  Severe right hydronephrosis, however, the right kidney was previously atrophic and her creatinine is at baseline.  This has probably been present for a while.  She does not exhibit evidence of pyelonephritis -  Discussed case with Dr. Retta Diones   Klebsiella UTI in  setting of interstitial cystitis.   -  Continue pentosan polysulfate (elmiron) -  Continue cipro day 4 of 7 -  f/u Dr. Isabel [name]  Generalized weakness  -  troponins neg -  ECHO:  EF 60%, no wall motion abl. -  tx UTI   -  TSH 3.075 -  PT/OT pending -  Anticipate SNF placement  Dehydration, BUN and creatinine improved  Acute hypoxic respiratory failure, likely primary resp acidosis, CXR neg -  Does not have high A-a gradient suggestive of PE and not tachycardic -  Wean oxygen   Chronic kidney disease stage III, creatinine at baseline of 1.3-1.5 -  Minimize nephrotoxins and renally dose medications  Hypothyroidism  -  TSH ~3 in setting of hypertensive urgency -  Continue home dose Synthroid and repeat TSH in a few weeks  RLS, stable.  Continue seroquel and requip  Dysuria, likely due to yeast infection from antibiotics -  Continue vag creams for yeast infection  Anxiety and agitation, uncontrolled last night. -  Increase Ativan one milligram TID and add one mg bid prn when necessary  Diet:  regular Access:  PIV IVF:  off Proph:  SCDs   Code Status: DNR Family Communication: patient alone Disposition Plan:  Trend blood pressure today, awaiting PT, OT consults. Anticipate discharge to skilled nursing facility tomorrow.  Consultants:  None  Procedures:  CXR   Antibiotics:  cipro 3/27 >>   HPI/Subjective:  Still having headache.  Agitated overnight and required an additional dose of Ativan.  Objective: Filed Vitals:  12/20/13 2106 12/20/13 2200 12/20/13 2307 12/21/13 0500  BP: 179/56 176/53 165/67 155/63  Pulse: 76  97 79  Temp: 99.1 F (37.3 C)   98.9 F (37.2 C)  TempSrc: Oral   Oral  Resp: 20   14  Height:      Weight:    58.922 kg (129 lb 14.4 oz)  SpO2: 97%   99%    Intake/Output Summary (Last 24 hours) at 12/21/13 0839 Last data filed at 12/20/13 2300  Gross per 24 hour  Intake    500 ml  Output      0 ml  Net    500 ml   Filed Weights    12/18/13 2342 12/20/13 0500 12/21/13 0500  Weight: 58.5 kg (128 lb 15.5 oz) 59.784 kg (131 lb 12.8 oz) 58.922 kg (129 lb 14.4 oz)    Exam:   General:  CF, No acute distress, nasal canula in place, stable from prior  HEENT:  NCAT, MMM  Cardiovascular:  IRRR, nl S1, S2 no mrg, 2+ pulses, warm extremities  Respiratory:  CTAB, no increased WOB  Abdomen:   NABS, soft, ND, possible mass palpated in the LLQ that is nontender  MSK:   Normal tone and bulk, no LEE  Neuro:  Grossly intact  Data Reviewed: Basic Metabolic Panel:  Recent Labs Lab 12/18/13 1830 12/18/13 2326 12/19/13 0515 12/20/13 0442 12/21/13 0350  NA 143  --  140 140 139  K 4.5  --  4.2 4.3 4.4  CL 108  --  105 107 103  CO2 19  --  25 24 23   GLUCOSE 101*  --  112* 103* 103*  BUN 21  --  19 14 13   CREATININE 1.51*  --  1.47* 1.50* 1.42*  CALCIUM 8.6  --  8.5 8.5 8.9  MG  --  1.6  --   --   --    Liver Function Tests:  Recent Labs Lab 12/19/13 0515  AST 13  ALT 10  ALKPHOS 68  BILITOT 0.4  PROT 6.0  ALBUMIN 2.8*   No results found for this basename: LIPASE, AMYLASE,  in the last 168 hours No results found for this basename: AMMONIA,  in the last 168 hours CBC:  Recent Labs Lab 12/18/13 1830 12/19/13 0515 12/20/13 0442 12/21/13 0350  WBC 6.8 6.8 8.1 9.1  NEUTROABS 4.1  --   --   --   HGB 12.4 11.6* 12.5 13.4  HCT 39.1 36.4 38.6 41.7  MCV 91.6 91.7 90.6 92.9  PLT 181 184 229 230   Cardiac Enzymes:  Recent Labs Lab 12/18/13 2326 12/19/13 0515 12/19/13 2351 12/20/13 0442 12/20/13 1047  TROPONINI <0.30 <0.30 <0.30 <0.30 <0.30   BNP (last 3 results)  Recent Labs  12/18/13 1846  PROBNP 2335.0*   CBG: No results found for this basename: GLUCAP,  in the last 168 hours  Recent Results (from the past 240 hour(s))  URINE CULTURE     Status: None   Collection Time    12/18/13  9:10 PM      Result Value Ref Range Status   Specimen Description URINE, CATHETERIZED   Final    Special Requests NONE   Final   Culture  Setup Time     Final   Value: 12/19/2013 03:39     Performed at [facility]   Colony Count     Final   Value: >=100,000 COLONIES/ML     [facility]formed at [facility]  Culture     Final   Value: KLEBSIELLA PNEUMONIAE     Performed at [facility]   Report Status 12/21/2013 FINAL   Final   Organism ID, Bacteria KLEBSIELLA PNEUMONIAE   Final  MRSA PCR SCREENING     Status: Abnormal   Collection Time    12/18/13 11:40 PM      Result Value Ref Range Status   MRSA by PCR INVALID RESULTS, SPECIMEN SENT FOR CULTURE (*) NEGATIVE Final   Comment: SPOKE WITH BANKS,T RN [phone] [place],N                The GeneXpert MRSA Assay (FDA     approved for NASAL specimens     only), is one component of a     comprehensive MRSA colonization     surveillance program. It is not     intended to diagnose MRSA     infection nor to guide or     monitor treatment for     MRSA infections.  MRSA CULTURE     Status: None   Collection Time    12/18/13 11:40 PM      Result Value Ref Range Status   Specimen Description NASOPHARYNGEAL   Final   Special Requests NONE   Final   Culture     Final   Value: NO SUSPICIOUS COLONIES, CONTINUING TO HOLD     Performed at [facility]   Report Status PENDING   Incomplete     Studies: Ct Abdomen Pelvis Wo Contrast  12/19/2013   CLINICAL DATA:  Stool irregularity, urinary incontinence. History of the abdominal mass. Diffuse abdominal pain.  EXAM: CT ABDOMEN AND PELVIS WITHOUT CONTRAST  TECHNIQUE: Multidetector CT imaging of the abdomen and pelvis was performed following the standard protocol without intravenous contrast.  COMPARISON:  07/05/2011  FINDINGS: There are small bilateral pleural effusions. Areas of atelectasis in the lower lobes bilaterally. Heart is mildly enlarged. Aortic and mitral valve calcifications noted. Diffuse coronary artery calcifications.  Prior cholecystectomy.  Intrahepatic and extrahepatic biliary ductal dilatation noted. The common bile duct measures 15 mm. This is unchanged since prior study. Small low-density lesion inferiorly within the right lobe of the liver measures 15 mm, stable. No additional focal abnormalities in the liver. Spleen, pancreas, adrenals have an unremarkable unenhanced appearance.  Severe right hydronephrosis, new since prior study. There is severe atrophy of the right kidney which was present on the prior study and is presumably renovascular. The right ureter is dilated to a soft tissue nodular mass in the right side of the pelvis measuring 2.4 by 1.9 cm. This area is new or enlarged since prior study.  Spiculated appearing soft tissue mass noted in the right lower quadrant mesenteric. This measures 4.6 x 3.2 cm and is similar to prior study. Central calcifications noted.  Small bowel is decompressed. Colon is unremarkable. Moderate-sized hiatal hernia present, stable since prior study. Stomach otherwise unremarkable.  Aorta and iliac vessels are calcified, non aneurysmal. No free fluid or free air.  Degenerative changes throughout the thoracolumbar spine. No acute bony abnormality.  IMPRESSION: Spiculated appearing mass in the mesenteric in the right lower quadrant with central calcifications and a desmoplastic appearance. Findings suspicious for carcinoid, abdominal desmoid, fibrosing mesenteritis. This is unchanged since 2012.  New soft tissue nodule in the right lower pelvis which is causing right-sided hydronephrosis, severe. However, the right kidney was previously severely atrophic, presumably from renovascular disease. This soft tissue nodule in the pelvis  appears contiguous with the right side of the vagina and is of unknown etiology.  Intrahepatic and extrahepatic biliary ductal dilatation.  Small bilateral effusions.   Electronically Signed   By: Charlett Nose M.D.   On: 12/19/2013 15:11    Scheduled Meds: . acetaminophen  1,000 mg  Oral QHS  . amLODipine  5 mg Oral Daily  . aspirin  81 mg Oral Daily  . ciprofloxacin  500 mg Oral Q breakfast  . clotrimazole  1 Applicatorful Vaginal QHS  . famotidine  20 mg Oral Daily  . hydrALAZINE  25 mg Oral TID  . influenza vac split quadrivalent PF  0.5 mL Intramuscular Tomorrow-1000  . levothyroxine  50 mcg Oral QAC breakfast  . LORazepam  1 mg Oral BID  . losartan  100 mg Oral Daily  . montelukast  10 mg Oral QPM  . pentosan polysulfate  100 mg Oral TID AC  . pneumococcal 23 valent vaccine  0.5 mL Intramuscular Tomorrow-1000  . QUEtiapine  25 mg Oral QHS  . rOPINIRole  1 mg Oral Daily  . rOPINIRole  2 mg Oral QHS  . sertraline  25 mg Oral Daily  . sodium chloride  3 mL Intravenous Q12H   Continuous Infusions:    Principal Problem:   HTN (hypertension), malignant Active Problems:   Weakness generalized   Hypothyroidism   UTI (lower urinary tract infection)   Dehydration   Bradycardia, sinus   Hypoxia   CKD (chronic kidney disease): baseline cr 1.3-1.5   Abdominal mass    Time spent: 30 min    Noal Abshier, [facility]  [facility] Pager [phone]. If 7PM-7AM, please contact night-coverage at [email or web address], password [email or web address] 12/21/2013, 8:39 AM  LOS: 3 days

## 2013-12-22 DIAGNOSIS — I10 Essential (primary) hypertension: Secondary | ICD-10-CM

## 2013-12-22 LAB — BASIC METABOLIC PANEL
BUN: 17 mg/dL (ref 6–23)
CALCIUM: 8.7 mg/dL (ref 8.4–10.5)
CO2: 23 mEq/L (ref 19–32)
CREATININE: 1.61 mg/dL — AB (ref 0.50–1.10)
Chloride: 102 mEq/L (ref 96–112)
GFR, EST AFRICAN AMERICAN: 32 mL/min — AB (ref >90)
GFR, EST NON AFRICAN AMERICAN: 27 mL/min — AB (ref >90)
GLUCOSE: 116 mg/dL — AB (ref 70–99)
Potassium: 4.4 mEq/L (ref 3.7–5.3)
Sodium: 139 mEq/L (ref 137–147)

## 2013-12-22 LAB — CBC
HCT: 41.5 % (ref 36.0–46.0)
Hemoglobin: 13.8 g/dL (ref 12.0–15.0)
MCH: 30.2 pg (ref 26.0–34.0)
MCHC: 33.3 g/dL (ref 30.0–36.0)
MCV: 90.8 fL (ref 78.0–100.0)
Platelets: 276 10*3/uL (ref 150–400)
RBC: 4.57 MIL/uL (ref 3.87–5.11)
RDW: 15.5 % (ref 11.5–15.5)
WBC: 11.5 10*3/uL — ABNORMAL HIGH (ref 4.0–10.5)

## 2013-12-22 MED ORDER — BISACODYL 10 MG RE SUPP: 10.0000 mg | Freq: Every day | RECTAL | Status: AC | PRN

## 2013-12-22 MED ORDER — LORAZEPAM 1 MG PO TABS: 1.0000 mg | ORAL_TABLET | Freq: Three times a day (TID) | ORAL | Status: DC

## 2013-12-22 MED ORDER — HYDRALAZINE HCL 25 MG PO TABS: 25.0000 mg | ORAL_TABLET | Freq: Three times a day (TID) | ORAL | Status: DC

## 2013-12-22 MED ORDER — NIFEDIPINE ER OSMOTIC RELEASE 90 MG PO TB24: 90.0000 mg | ORAL_TABLET | Freq: Every day | ORAL | Status: DC

## 2013-12-22 MED ORDER — NIFEDIPINE ER OSMOTIC RELEASE 90 MG PO TB24
90.0000 mg | ORAL_TABLET | Freq: Every day | ORAL | Status: DC
  Filled 2013-12-22: qty 1

## 2013-12-22 MED ORDER — FAMOTIDINE 20 MG PO TABS: 20.0000 mg | ORAL_TABLET | Freq: Every day | ORAL | Status: DC

## 2013-12-22 MED ORDER — CIPROFLOXACIN HCL 500 MG PO TABS
500.0000 mg | ORAL_TABLET | Freq: Every day | ORAL | Status: DC
Start: 2013-12-22 — End: 2014-03-27

## 2013-12-22 NOTE — Discharge Summary (Addendum)
Physician Discharge Summary  Leslie [name]:[id number] DOB: March 07, 1922 DOA: 12/18/2013  PCP: Ginette Otto, MD  Admit date: 12/18/2013 Discharge date: 12/22/2013  Recommendations for Outpatient Follow-up:  1. Followup with primary care doctor within one week for repeat blood pressure.  F/u plasma metanephrines, renin-aldosterone ratio, and 5-HIAA which are pending.  Repeat CBC and BMP to follow up kidney function and WBC count.   2. Follow up with Dr. Isabel [name] within 1 month.   Discharge Diagnoses:  Principal Problem:   HTN (hypertension), malignant Active Problems:   Weakness generalized   Hypothyroidism   UTI (lower urinary tract infection)   Dehydration   Bradycardia, sinus   Hypoxia   CKD (chronic kidney disease): baseline cr 1.3-1.5   Abdominal mass   Discharge Condition: Stable, improved  Diet recommendation: Regular  Wt Readings from Last 3 Encounters:  12/22/13 57.38 kg (126 lb 8 oz)  10/02/11 65.59 kg (144 lb 9.6 oz)    History of present illness:  Leslie [name] is a 80 y.o. female  With history of interstitial cystitis, hypothyroidism, fibromyalgia, hypertension, depression, hyperlipidemia, anxiety, history of prior CVA, history of sinus bradycardia and presented to the ED with a 2 day history of urinary incontinence, generalized weakness to the point patient could barely walk. Per patient's daughter the patient's clonidine was recently increased to 0.2 mg twice daily secondary to elevated blood pressures. Family was concerned that patient was getting dehydrated and patient was subsequently brought to the ED. Patient denies any fevers, no chills, no nausea, no vomiting, no chest pain, no shortness of breath, no abdominal pain, no constipation, no cough, no upper rest for his symptoms. Per daughter patient has had a chronic history of alternating between constipation and diarrhea and patient has been having some loose stools over the past few days. Patient has not  recently been on any antibiotics. Per daughter patient's blood pressure systolically usually runs in the 160s to 170s.  Patient was seen in the emergency room her systolic blood pressure was noted to range from 210-250. Urinalysis which was done was nitrite negative trace leukocytes 7-10 WBCs and a few bacteria. Basic metabolic profile and a creatinine of 1.51 otherwise was within normal limits. Point-of-care troponin was 0.01. Pro BNP was 2335. CBC done was within normal limits. Chest x-ray done was negative for any acute infiltrates. EKG showed a sinus bradycardia with a first-degree AV block.  Patient was given IV ciprofloxacin we were called to admit the patient for further evaluation and management.   Hospital Course:   Malignant hypertensive urgency with labile blood pressures. She complained of headache, however head CT was negative. Her orthostatics were negative. Rate in a plaster and ratio is pending as is serum metanephrines. Renal artery duplex could not be completed secondary to bowel gas. She was initially started on clonidine and hydralazine, however she developed severe bradycardia to the very low 30s. This made her headache worse. Her clonidine was discontinued, and her losartan was restarted, hydralazine titrated up to 25 mg 3 times a day. Initially tried to use Norvasc for blood pressure control, however she continued to have blood pressures in the low 200s systolic intermittently. Discussed the case with Doctor Allena Katz from nephrology who recommended discontinuing Norvasc and starting nifedipine extended release. Her blood pressures have trended down to the 150s systolic, which is acceptable given her age. Troponins were negative x3. She should followup with her primary care doctor within one to 2 weeks for repeat blood pressure, and adjustment  of her medication doses as needed.  Sinus bradycardia with first degree block, it possibly mild headache but otherwise asymptomatic. Recommend  avoiding AV nodal blocking medications such as beta blockers, clonidine, calcium channel blockers if able. Echocardiogram demonstrated ejection fraction of 60% with mild LVH, no wall motion abnormalities, mild left atrium dilation, right ventricle with mildly reduced systolic function.  Possible atrial flutter/atrial fibrillation. This happened overnight on March 28 into March 29. After reviewing the telemetry myself, the baseline appears to be quite erratic. It is unclear whether she was having atrial flutter or baseline irregularities from movement or artifact. She did not have any further episodes of possible atrial flutter, however she did have some Aaiden Depoy runs of nonsustained V. tach of approximately 3 beats. Recommend maximizing calcium channel blocker, avoiding beta blockers secondary to bradycardia. Because she is high falls risk, recommend aspirin prophylaxis. CXR report above. Troponins were negative and TSH was within normal limits.  Abdominal mass, likely represents malignancy and was seen on CAT scan in 2013. The primary mass appears stable a repeat CAT scan, however she is a new mass adjacent to the right ureter near the bladder which is compressing the right ureter and causing severe right hydronephrosis. Followup urinary 5-HIAA. Appearance on CAT scan suggests carcinoid, and given the patient's labile blood pressures and chronic diarrhea, this is a likely diagnosis. If urinary screen is positive, this would help and symptom management of her diarrhea. Family is electing to watch and wait, however they can talk to their primary care doctor regarding referral to oncology if they so desire.  Severe right hydronephrosis, right kidney with atrophic previously and her creatinine is at baseline. She did not exhibit evidence of pyelonephritis. Discussed the case with Doctor Dahlstedt from urology, who recommended against any procedures at this time.  She should followup with Doctor Isabel [name] from urology.     Klebsiella urinary tract infection in the setting of interstitial cystitis.  Continued pentosan polysulfate (elmiron).  Day of discharge is day 5 of 7 of ciprofloxacin.   Generalized weakness, troponins were negative. Echocardiogram with preserved ejection fraction. Her urinary tract infection was treated. The TSH was within normal limits. She worked with physical and occupational therapy who recommended skilled nursing placement.  Dehydration with mildly elevated BUN and creatinine. IV fluids caused a mild decrease in BUN and creatinine.  Acute hypoxic respiratory failure was probably primarily a respiratory acidosis. Chest x-ray was negative. She did not have a high AA gradient suggestive of pulmonary embolism and she was not tachycardic.  This resolved spontaneously.  Chronic kidney disease stage III, baseline creatinine of 1.3-1.5. Stable.  Hypothyroidism, TSH of 3, however this was in the setting of malignant hypertension and urinary tract infection. Recommend followup TSH in a few weeks by primary care doctor. Continued home dose Synthroid.  Restless leg syndrome, stable. Continue Seroquel and Requip.  Agitation anxiety, uncontrolled.  Continued seroquel and zoloft.  Increased Ativan to 1 mg 3 times a day and added 1 mg twice a day when necessary when necessary during hospitalization.  Consultants:  None Procedures:  CXR Antibiotics:  cipro 3/27 >>    Discharge Exam: Filed Vitals:   12/22/13 0430  BP: 146/61  Pulse: 90  Temp: 98.9 F (37.2 C)  Resp: 18   Filed Vitals:   12/21/13 1439 12/21/13 2015 12/22/13 0252 12/22/13 0430  BP: 179/68 155/68  146/61  Pulse:  102  90  Temp:  98.1 F (36.7 C)  98.9 F (37.2 C)  TempSrc:  Oral  Oral  Resp:  20  18  Height:      Weight:    57.38 kg (126 lb 8 oz)  SpO2:  93% 100% 93%    General: CF, No acute distress, sitting up eating breakfast HEENT: NCAT, MMM  Cardiovascular: RRR, nl S1, S2 no mrg, 2+ pulses, warm  extremities  Respiratory: CTAB, no increased WOB  Abdomen: NABS, soft, ND, possible mass palpated in the LLQ that is nontender  MSK: Normal tone and bulk, no LEE  Neuro: Grossly intact   Discharge Instructions      Discharge Orders   Future Orders Complete By Expires   Call MD for:  difficulty breathing, headache or visual disturbances  As directed    Call MD for:  extreme fatigue  As directed    Call MD for:  hives  As directed    Call MD for:  persistant dizziness or light-headedness  As directed    Call MD for:  persistant nausea and vomiting  As directed    Call MD for:  severe uncontrolled pain  As directed    Call MD for:  temperature >100.4  As directed    Diet general  As directed    Discharge instructions  As directed    Comments:     Ms. Piggott was hospitalized with elevated blood pressure and urinary tract infection. Her blood pressure medications have been adjusted to control her blood pressure without making her heart rates are slow. There are blood tests and urine tests which are pending at the time of discharge to determine what the cause of her very elevated blood pressure is. Her primary care doctor can followup on these results. She needs to complete 2 additional days of ciprofloxacin through April 2, then stop, for Klebsiella urinary tract infection.   Driving Restrictions  As directed    Comments:     No driving or operating heavy machinery.   Increase activity slowly  As directed        Medication List    STOP taking these medications       amLODipine 2.5 MG tablet  Commonly known as:  NORVASC     cloNIDine 0.1 MG tablet  Commonly known as:  CATAPRES      TAKE these medications       acetaminophen 500 MG tablet  Commonly known as:  TYLENOL  Take 1,000 mg by mouth at bedtime.     aspirin 81 MG tablet  Take 81 mg by mouth daily.     bisacodyl 10 MG suppository  Commonly known as:  DULCOLAX  Place 1 suppository (10 mg total) rectally daily as  needed for moderate constipation.     ciprofloxacin 500 MG tablet  Commonly known as:  CIPRO  Take 1 tablet (500 mg total) by mouth daily with breakfast.     famotidine 20 MG tablet  Commonly known as:  PEPCID  Take 1 tablet (20 mg total) by mouth daily.     hydrALAZINE 25 MG tablet  Commonly known as:  APRESOLINE  Take 1 tablet (25 mg total) by mouth 3 (three) times daily.     levothyroxine 50 MCG tablet  Commonly known as:  SYNTHROID, LEVOTHROID  Take 50 mcg by mouth daily.     LORazepam 1 MG tablet  Commonly known as:  ATIVAN  Take 1 tablet (1 mg total) by mouth 3 (three) times daily.     losartan 100 MG tablet  Commonly  known as:  COZAAR  Take 100 mg by mouth daily.     montelukast 10 MG tablet  Commonly known as:  SINGULAIR  Take 10 mg by mouth every evening.     NIFEdipine 90 MG 24 hr tablet  Commonly known as:  PROCARDIA XL/ADALAT-CC  Take 1 tablet (90 mg total) by mouth at bedtime.     pentosan polysulfate 100 MG capsule  Commonly known as:  ELMIRON  Take 100 mg by mouth 3 (three) times daily before meals.     QUEtiapine 25 MG tablet  Commonly known as:  SEROQUEL  Take 25 mg by mouth at bedtime.     rOPINIRole 1 MG tablet  Commonly known as:  REQUIP  - Take 1-2 mg by mouth See admin instructions. Take 1 tablet every morning  - Take 2 tablets at bedtime     sertraline 25 MG tablet  Commonly known as:  ZOLOFT  Take 25 mg by mouth daily.       Follow-up Information   Follow up with Ginette OttoSTONEKING,HAL THOMAS, MD. Schedule an appointment as soon as possible for a visit in 2 weeks.   Specialty:  Internal Medicine   Contact information:   [address] [place] [address] [phone]       [facility]. Schedule an appointment as soon as possible for a visit in 1 month.   Specialty:  Urology   Contact information:   [address] [facility]  [place] [address] [phone]        The results of significant diagnostics from this hospitalization (including imaging, microbiology, ancillary and laboratory) are listed below for reference.    Significant Diagnostic Studies: Ct Abdomen Pelvis Wo Contrast  12/19/2013   CLINICAL DATA:  Stool irregularity, urinary incontinence. History of the abdominal mass. Diffuse abdominal pain.  EXAM: CT ABDOMEN AND PELVIS WITHOUT CONTRAST  TECHNIQUE: Multidetector CT imaging of the abdomen and pelvis was performed following the standard protocol without intravenous contrast.  COMPARISON:  07/05/2011  FINDINGS: There are small bilateral pleural effusions. Areas of atelectasis in the lower lobes bilaterally. Heart is mildly enlarged. Aortic and mitral valve calcifications noted. Diffuse coronary artery calcifications.  Prior cholecystectomy. Intrahepatic and extrahepatic biliary ductal dilatation noted. The common bile duct measures 15 mm. This is unchanged since prior study. Small low-density lesion inferiorly within the right lobe of the liver measures 15 mm, stable. No additional focal abnormalities in the liver. Spleen, pancreas, adrenals have an unremarkable unenhanced appearance.  Severe right hydronephrosis, new since prior study. There is severe atrophy of the right kidney which was present on the prior study and is presumably renovascular. The right ureter is dilated to a soft tissue nodular mass in the right side of the pelvis measuring 2.4 by 1.9 cm. This area is new or enlarged since prior study.  Spiculated appearing soft tissue mass noted in the right lower quadrant mesenteric. This measures 4.6 x 3.2 cm and is similar to prior study. Central calcifications noted.  Small bowel is decompressed. Colon is unremarkable. Moderate-sized hiatal hernia present, stable since prior study. Stomach otherwise unremarkable.  Aorta and iliac vessels are calcified, non aneurysmal. No free fluid or free air.  Degenerative changes throughout the thoracolumbar  spine. No acute bony abnormality.  IMPRESSION: Spiculated appearing mass in the mesenteric in the right lower quadrant with central calcifications and a desmoplastic appearance. Findings suspicious for carcinoid, abdominal desmoid, fibrosing mesenteritis. This is  unchanged since 2012.  New soft tissue nodule in the right lower pelvis which is causing right-sided hydronephrosis, severe. However, the right kidney was previously severely atrophic, presumably from renovascular disease. This soft tissue nodule in the pelvis appears contiguous with the right side of the vagina and is of unknown etiology.  Intrahepatic and extrahepatic biliary ductal dilatation.  Small bilateral effusions.   Electronically Signed   By: Charlett Nose M.D.   On: 12/19/2013 15:11   Dg Chest 2 View  12/18/2013   CLINICAL DATA:  Shortness of breath, weakness  EXAM: CHEST  2 VIEW  COMPARISON:  03/26/2013  FINDINGS: Low lung volumes. Chronic interstitial markings/emphysematous changes. No focal consolidation. No pleural effusion or pneumothorax.  Stable eventration of the right hemidiaphragm.  Mild cardiomegaly.  Degenerative changes of the visualized thoracolumbar spine.  Postsurgical changes with suspected nonunion in the right shoulder, unchanged.  IMPRESSION: No evidence of acute cardiopulmonary disease.   Electronically Signed   By: Charline Bills M.D.   On: 12/18/2013 19:58   Ct Head Wo Contrast  12/21/2013   CLINICAL DATA:  Persistent headache with elevated blood pressure.  EXAM: CT HEAD WITHOUT CONTRAST  TECHNIQUE: Contiguous axial images were obtained from the base of the skull through the vertex without intravenous contrast.  COMPARISON:  04/28/2012  FINDINGS: Images are mildly degraded by motion. No acute intracranial hemorrhage is identified. Confluent periventricular white matter hypodensities are again seen and are stable to slightly progressed from the prior CT, nonspecific but compatible with moderate chronic small vessel  ischemic disease. Age-related cerebral atrophy is noted. There is no definite evidence of acute cortical infarct, mass, midline shift, or extra-axial fluid collection. Subcentimeter hypodensity in the right thalamus may reflect a lacunar infarct of indeterminate age.  Mastoid air cells and paranasal sinuses are clear. Orbits are unremarkable.  IMPRESSION: 1. No acute intracranial hemorrhage or evidence of large territory infarct. 2. Moderate chronic small vessel ischemic disease.   Electronically Signed   By: Sebastian Ache   On: 12/21/2013 12:29    Microbiology: Recent Results (from the past 240 hour(s))  URINE CULTURE     Status: None   Collection Time    12/18/13  9:10 PM      Result Value Ref Range Status   Specimen Description URINE, CATHETERIZED   Final   Special Requests NONE   Final   Culture  Setup Time     Final   Value: 12/19/2013 03:39     Performed at [facility]   Colony Count     Final   Value: >=100,000 COLONIES/ML     Performed at [facility]   Culture     Final   Value: KLEBSIELLA PNEUMONIAE     Performed at [facility]   Report Status 12/21/2013 FINAL   Final   Organism ID, Bacteria KLEBSIELLA PNEUMONIAE   Final  MRSA PCR SCREENING     Status: Abnormal   Collection Time    12/18/13 11:40 PM      Result Value Ref Range Status   MRSA by PCR INVALID RESULTS, SPECIMEN SENT FOR CULTURE (*) NEGATIVE Final   Comment: SPOKE WITH BANKS,T RN [phone] COV[place],N                The GeneXpert MRSA Assay (FDA     approved for NASAL specimens     only), is one component of a     comprehensive MRSA colonization     surveillance  program. It is not     intended to diagnose MRSA     infection nor to guide or     monitor treatment for     MRSA infections.  MRSA CULTURE     Status: None   Collection Time    12/18/13 11:40 PM      Result Value Ref Range Status   Specimen Description NASOPHARYNGEAL   Final   Special Requests NONE   Final   Culture      Final   Value: NO STAPHYLOCOCCUS AUREUS ISOLATED     Note: NOMRSA     Performed at [facility]   Report Status 12/21/2013 FINAL   Final     Labs: Basic Metabolic Panel:  Recent Labs Lab 12/18/13 1830 12/18/13 2326 12/19/13 0515 12/20/13 0442 12/21/13 0350 12/22/13 0403  NA 143  --  140 140 139 139  K 4.5  --  4.2 4.3 4.4 4.4  CL 108  --  105 107 103 102  CO2 19  --  25 24 23 23   GLUCOSE 101*  --  112* 103* 103* 116*  BUN 21  --  19 14 13 17   CREATININE 1.51*  --  1.47* 1.50* 1.42* 1.61*  CALCIUM 8.6  --  8.5 8.5 8.9 8.7  MG  --  1.6  --   --   --   --    Liver Function Tests:  Recent Labs Lab 12/19/13 0515  AST 13  ALT 10  ALKPHOS 68  BILITOT 0.4  PROT 6.0  ALBUMIN 2.8*   No results found for this basename: LIPASE, AMYLASE,  in the last 168 hours No results found for this basename: AMMONIA,  in the last 168 hours CBC:  Recent Labs Lab 12/18/13 1830 12/19/13 0515 12/20/13 0442 12/21/13 0350 12/22/13 0403  WBC 6.8 6.8 8.1 9.1 11.5*  NEUTROABS 4.1  --   --   --   --   HGB 12.4 11.6* 12.5 13.4 13.8  HCT 39.1 36.4 38.6 41.7 41.5  MCV 91.6 91.7 90.6 92.9 90.8  PLT 181 184 229 230 276   Cardiac Enzymes:  Recent Labs Lab 12/18/13 2326 12/19/13 0515 12/19/13 2351 12/20/13 0442 12/20/13 1047  TROPONINI <0.30 <0.30 <0.30 <0.30 <0.30   BNP: BNP (last 3 results)  Recent Labs  12/18/13 1846  PROBNP 2335.0*   CBG: No results found for this basename: GLUCAP,  in the last 168 hours  Time coordinating discharge: 45 minutes  Signed:  Marquan Vokes  [facility] 12/22/2013, 8:14 AM

## 2013-12-22 NOTE — Progress Notes (Signed)
Clinical Social Work Department CLINICAL SOCIAL WORK PLACEMENT NOTE 12/22/2013  Patient:  Leslie [name],Leslie [name]  Account Number:  [id number] Admit date:  12/18/2013  Clinical Social Worker:  Becky SaxBETHANY Buckley Bradly, LCSW  Date/time:  12/22/2013 12:00 M  Clinical Social Work is seeking post-discharge placement for this patient at the following level of care:   SKILLED NURSING   (*CSW will update this form in Epic as items are completed)   12/22/2013  Patient/family provided with [facility] Department of Clinical Social Work's list of facilities offering this level of care within the geographic area requested by the patient (or if unable, by the patient's family).  12/22/2013  Patient/family informed of their freedom to choose among providers that offer the needed level of care, that participate in Medicare, Medicaid or managed care program needed by the patient, have an available bed and are willing to accept the patient.  12/22/2013  Patient/family informed of [facility]' ownership interest in [facility], as well as of the fact that they are under no obligation to receive care at this facility.  PASARR submitted to EDS on 12/22/2013 PASARR number received from EDS on 12/22/2013  FL2 transmitted to all facilities in geographic area requested by pt/family on  12/22/2013 FL2 transmitted to all facilities within larger geographic area on 12/22/2013  Patient informed that his/her managed care company has contracts with or will negotiate with  certain facilities, including the following:     Patient/family informed of bed offers received:  12/22/2013 [facility] [place] Physician recommends and patient chooses bed at    [facility] [place] on  12/22/2013 Patient to be transferred to facility by [facility]  The following physician request were entered in Epic:   Additional Comments:

## 2013-12-22 NOTE — Progress Notes (Signed)
Patient cleared for discharge. CSW met with patient. Patient is alert and oriented X3. Patient is now requesting [facility] [place]. [facility] is able to accept patient today. Patient notified her daughter. Packet copied and placed in [place]. [facility] called for transportation.  Leslie [name]. [place] MSW, [place]

## 2013-12-23 LAB — 5 HIAA, QUANTITATIVE, URINE, 24 HOUR
5-HIAA, 24 Hr Urine: 4.8 mg/24 h (ref <=6.0)
Volume, Urine-5HIAA: 1300 mL/24 h

## 2013-12-24 LAB — METANEPHRINES, PLASMA
Metanephrine, Free: <25 pg/mL (ref <=57)
NORMETANEPHRINE FREE: 29 pg/mL (ref <=148)
TOTAL METANEPHRINES-PLASMA: 29 pg/mL (ref <=205)

## 2013-12-29 LAB — ALDOSTERONE + RENIN ACTIVITY W/ RATIO
ALDO / PRA RATIO: 33.3 ratio — AB (ref 0.9–28.9)
ALDOSTERONE: 2 ng/dL
PRA LC/MS/MS: 0.06 ng/mL/h — ABNORMAL LOW (ref 0.25–5.82)

## 2014-03-27 ENCOUNTER — Emergency Department (HOSPITAL_COMMUNITY): Payer: Medicare Other

## 2014-03-27 ENCOUNTER — Inpatient Hospital Stay (HOSPITAL_COMMUNITY)
Admission: EM | Admit: 2014-03-27 | Discharge: 2014-04-05 | DRG: 280 | Disposition: A | Discharge status: Skilled Nursing Facility | Payer: Medicare Other | Attending: Internal Medicine | Admitting: Internal Medicine

## 2014-03-27 ENCOUNTER — Encounter (HOSPITAL_COMMUNITY): Payer: Self-pay | Admitting: Emergency Medicine

## 2014-03-27 DIAGNOSIS — R0902 Hypoxemia: Secondary | ICD-10-CM | POA: Diagnosis present

## 2014-03-27 DIAGNOSIS — I129 Hypertensive chronic kidney disease with stage 1 through stage 4 chronic kidney disease, or unspecified chronic kidney disease: Secondary | ICD-10-CM | POA: Diagnosis present

## 2014-03-27 DIAGNOSIS — R197 Diarrhea, unspecified: Secondary | ICD-10-CM

## 2014-03-27 DIAGNOSIS — R531 Weakness: Secondary | ICD-10-CM

## 2014-03-27 DIAGNOSIS — Z7982 Long term (current) use of aspirin: Secondary | ICD-10-CM

## 2014-03-27 DIAGNOSIS — F411 Generalized anxiety disorder: Secondary | ICD-10-CM | POA: Diagnosis present

## 2014-03-27 DIAGNOSIS — G20A1 Parkinson's disease without dyskinesia, without mention of fluctuations: Secondary | ICD-10-CM | POA: Diagnosis present

## 2014-03-27 DIAGNOSIS — R0602 Shortness of breath: Secondary | ICD-10-CM

## 2014-03-27 DIAGNOSIS — E876 Hypokalemia: Secondary | ICD-10-CM | POA: Diagnosis not present

## 2014-03-27 DIAGNOSIS — I498 Other specified cardiac arrhythmias: Secondary | ICD-10-CM | POA: Diagnosis present

## 2014-03-27 DIAGNOSIS — Z79899 Other long term (current) drug therapy: Secondary | ICD-10-CM

## 2014-03-27 DIAGNOSIS — M6282 Rhabdomyolysis: Secondary | ICD-10-CM

## 2014-03-27 DIAGNOSIS — N183 Chronic kidney disease, stage 3 unspecified: Secondary | ICD-10-CM

## 2014-03-27 DIAGNOSIS — Z8673 Personal history of transient ischemic attack (TIA), and cerebral infarction without residual deficits: Secondary | ICD-10-CM

## 2014-03-27 DIAGNOSIS — Z66 Do not resuscitate: Secondary | ICD-10-CM | POA: Diagnosis present

## 2014-03-27 DIAGNOSIS — G4700 Insomnia, unspecified: Secondary | ICD-10-CM | POA: Diagnosis present

## 2014-03-27 DIAGNOSIS — W19XXXA Unspecified fall, initial encounter: Secondary | ICD-10-CM

## 2014-03-27 DIAGNOSIS — I10 Essential (primary) hypertension: Secondary | ICD-10-CM

## 2014-03-27 DIAGNOSIS — D72829 Elevated white blood cell count, unspecified: Secondary | ICD-10-CM

## 2014-03-27 DIAGNOSIS — F3289 Other specified depressive episodes: Secondary | ICD-10-CM | POA: Diagnosis present

## 2014-03-27 DIAGNOSIS — R001 Bradycardia, unspecified: Secondary | ICD-10-CM

## 2014-03-27 DIAGNOSIS — N181 Chronic kidney disease, stage 1: Secondary | ICD-10-CM | POA: Diagnosis present

## 2014-03-27 DIAGNOSIS — T796XXD Traumatic ischemia of muscle, subsequent encounter: Secondary | ICD-10-CM

## 2014-03-27 DIAGNOSIS — E039 Hypothyroidism, unspecified: Secondary | ICD-10-CM | POA: Diagnosis present

## 2014-03-27 DIAGNOSIS — J9601 Acute respiratory failure with hypoxia: Secondary | ICD-10-CM

## 2014-03-27 DIAGNOSIS — R778 Other specified abnormalities of plasma proteins: Secondary | ICD-10-CM | POA: Diagnosis present

## 2014-03-27 DIAGNOSIS — I161 Hypertensive emergency: Secondary | ICD-10-CM | POA: Diagnosis present

## 2014-03-27 DIAGNOSIS — Z515 Encounter for palliative care: Secondary | ICD-10-CM

## 2014-03-27 DIAGNOSIS — N189 Chronic kidney disease, unspecified: Secondary | ICD-10-CM | POA: Diagnosis present

## 2014-03-27 DIAGNOSIS — N179 Acute kidney failure, unspecified: Secondary | ICD-10-CM | POA: Diagnosis present

## 2014-03-27 DIAGNOSIS — K140 Glossitis: Secondary | ICD-10-CM

## 2014-03-27 DIAGNOSIS — E43 Unspecified severe protein-calorie malnutrition: Secondary | ICD-10-CM

## 2014-03-27 DIAGNOSIS — N1330 Unspecified hydronephrosis: Secondary | ICD-10-CM | POA: Diagnosis present

## 2014-03-27 DIAGNOSIS — I214 Non-ST elevation (NSTEMI) myocardial infarction: Principal | ICD-10-CM

## 2014-03-27 DIAGNOSIS — IMO0002 Reserved for concepts with insufficient information to code with codable children: Secondary | ICD-10-CM

## 2014-03-27 DIAGNOSIS — J9600 Acute respiratory failure, unspecified whether with hypoxia or hypercapnia: Secondary | ICD-10-CM | POA: Diagnosis present

## 2014-03-27 DIAGNOSIS — R7989 Other specified abnormal findings of blood chemistry: Secondary | ICD-10-CM | POA: Diagnosis present

## 2014-03-27 DIAGNOSIS — I5033 Acute on chronic diastolic (congestive) heart failure: Secondary | ICD-10-CM | POA: Diagnosis present

## 2014-03-27 DIAGNOSIS — R5381 Other malaise: Secondary | ICD-10-CM | POA: Diagnosis present

## 2014-03-27 DIAGNOSIS — E872 Acidosis, unspecified: Secondary | ICD-10-CM | POA: Diagnosis present

## 2014-03-27 DIAGNOSIS — R6510 Systemic inflammatory response syndrome (SIRS) of non-infectious origin without acute organ dysfunction: Secondary | ICD-10-CM | POA: Diagnosis present

## 2014-03-27 DIAGNOSIS — E785 Hyperlipidemia, unspecified: Secondary | ICD-10-CM | POA: Diagnosis present

## 2014-03-27 DIAGNOSIS — I509 Heart failure, unspecified: Secondary | ICD-10-CM

## 2014-03-27 DIAGNOSIS — G20 Parkinson's disease: Secondary | ICD-10-CM | POA: Diagnosis present

## 2014-03-27 DIAGNOSIS — F329 Major depressive disorder, single episode, unspecified: Secondary | ICD-10-CM | POA: Diagnosis present

## 2014-03-27 DIAGNOSIS — R1900 Intra-abdominal and pelvic swelling, mass and lump, unspecified site: Secondary | ICD-10-CM

## 2014-03-27 DIAGNOSIS — I441 Atrioventricular block, second degree: Secondary | ICD-10-CM

## 2014-03-27 LAB — URINALYSIS, ROUTINE W REFLEX MICROSCOPIC
BILIRUBIN URINE: NEGATIVE
Glucose, UA: NEGATIVE mg/dL
Ketones, ur: NEGATIVE mg/dL
Leukocytes, UA: NEGATIVE
NITRITE: NEGATIVE
Protein, ur: >300 mg/dL — AB
Specific Gravity, Urine: 1.017 (ref 1.005–1.030)
UROBILINOGEN UA: 0.2 mg/dL (ref 0.0–1.0)
pH: 6.5 (ref 5.0–8.0)

## 2014-03-27 LAB — URINE MICROSCOPIC-ADD ON

## 2014-03-27 LAB — MRSA PCR SCREENING: MRSA BY PCR: NEGATIVE

## 2014-03-27 LAB — CBC
HCT: 42.5 % (ref 36.0–46.0)
Hemoglobin: 14.2 g/dL (ref 12.0–15.0)
MCH: 29.6 pg (ref 26.0–34.0)
MCHC: 33.4 g/dL (ref 30.0–36.0)
MCV: 88.7 fL (ref 78.0–100.0)
Platelets: 312 10*3/uL (ref 150–400)
RBC: 4.79 MIL/uL (ref 3.87–5.11)
RDW: 15.5 % (ref 11.5–15.5)
WBC: 32 10*3/uL — ABNORMAL HIGH (ref 4.0–10.5)

## 2014-03-27 LAB — CBC WITH DIFFERENTIAL/PLATELET
BASOS ABS: 0 10*3/uL (ref 0.0–0.1)
BASOS PCT: 0 % (ref 0–1)
Eosinophils Absolute: 0 10*3/uL (ref 0.0–0.7)
Eosinophils Relative: 0 % (ref 0–5)
HCT: 46.9 % — ABNORMAL HIGH (ref 36.0–46.0)
Hemoglobin: 15.5 g/dL — ABNORMAL HIGH (ref 12.0–15.0)
Lymphocytes Relative: 6 % — ABNORMAL LOW (ref 12–46)
Lymphs Abs: 1.4 10*3/uL (ref 0.7–4.0)
MCH: 29.8 pg (ref 26.0–34.0)
MCHC: 33 g/dL (ref 30.0–36.0)
MCV: 90 fL (ref 78.0–100.0)
MONO ABS: 1.1 10*3/uL — AB (ref 0.1–1.0)
Monocytes Relative: 5 % (ref 3–12)
NEUTROS ABS: 21.2 10*3/uL — AB (ref 1.7–7.7)
NEUTROS PCT: 89 % — AB (ref 43–77)
Platelets: 336 10*3/uL (ref 150–400)
RBC: 5.21 MIL/uL — ABNORMAL HIGH (ref 3.87–5.11)
RDW: 15.4 % (ref 11.5–15.5)
WBC: 23.7 10*3/uL — ABNORMAL HIGH (ref 4.0–10.5)

## 2014-03-27 LAB — CREATININE, SERUM
CREATININE: 1.71 mg/dL — AB (ref 0.50–1.10)
GFR calc Af Amer: 29 mL/min — ABNORMAL LOW (ref >90)
GFR, EST NON AFRICAN AMERICAN: 25 mL/min — AB (ref >90)

## 2014-03-27 LAB — COMPREHENSIVE METABOLIC PANEL
ALBUMIN: 3.7 g/dL (ref 3.5–5.2)
ALK PHOS: 117 U/L (ref 39–117)
ALT: 14 U/L (ref 0–35)
ANION GAP: 21 — AB (ref 5–15)
AST: 23 U/L (ref 0–37)
BUN: 27 mg/dL — AB (ref 6–23)
CALCIUM: 9.3 mg/dL (ref 8.4–10.5)
CO2: 19 mEq/L (ref 19–32)
Chloride: 98 mEq/L (ref 96–112)
Creatinine, Ser: 1.61 mg/dL — ABNORMAL HIGH (ref 0.50–1.10)
GFR calc Af Amer: 31 mL/min — ABNORMAL LOW (ref >90)
GFR calc non Af Amer: 27 mL/min — ABNORMAL LOW (ref >90)
Glucose, Bld: 188 mg/dL — ABNORMAL HIGH (ref 70–99)
Potassium: 4 mEq/L (ref 3.7–5.3)
Sodium: 138 mEq/L (ref 137–147)
Total Bilirubin: 0.4 mg/dL (ref 0.3–1.2)
Total Protein: 8.2 g/dL (ref 6.0–8.3)

## 2014-03-27 LAB — CK
Total CK: 85 U/L (ref 7–177)
Total CK: 203 U/L — ABNORMAL HIGH (ref 7–177)

## 2014-03-27 LAB — I-STAT ARTERIAL BLOOD GAS, ED
Acid-base deficit: 4 mmol/L — ABNORMAL HIGH (ref 0.0–2.0)
Bicarbonate: 22.4 mEq/L (ref 20.0–24.0)
O2 SAT: 89 %
PCO2 ART: 45.6 mmHg — AB (ref 35.0–45.0)
PH ART: 7.299 — AB (ref 7.350–7.450)
PO2 ART: 64 mmHg — AB (ref 80.0–100.0)
Patient temperature: 98.6
TCO2: 24 mmol/L (ref 0–100)

## 2014-03-27 LAB — TROPONIN I
TROPONIN I: 0.38 ng/mL — AB (ref <0.30)
TROPONIN I: 3.46 ng/mL — AB (ref <0.30)

## 2014-03-27 LAB — I-STAT CG4 LACTIC ACID, ED: LACTIC ACID, VENOUS: 4.47 mmol/L — AB (ref 0.5–2.2)

## 2014-03-27 LAB — PRO B NATRIURETIC PEPTIDE: PRO B NATRI PEPTIDE: 14898 pg/mL — AB (ref 0–450)

## 2014-03-27 MED ORDER — LABETALOL HCL 5 MG/ML IV SOLN
0.5000 mg/min | INTRAVENOUS | Status: DC
Start: 2014-03-27 — End: 2014-03-27
  Administered 2014-03-27: 1 mg/min via INTRAVENOUS
  Filled 2014-03-27: qty 100

## 2014-03-27 MED ORDER — HYDRALAZINE HCL 25 MG PO TABS
25.0000 mg | ORAL_TABLET | Freq: Three times a day (TID) | ORAL | Status: DC
  Administered 2014-03-27 – 2014-04-02 (×19): 25 mg via ORAL
  Filled 2014-03-27 (×22): qty 1

## 2014-03-27 MED ORDER — ACETAMINOPHEN 325 MG PO TABS
650.0000 mg | ORAL_TABLET | Freq: Four times a day (QID) | ORAL | Status: DC | PRN
  Administered 2014-03-28 – 2014-04-02 (×6): 650 mg via ORAL
  Filled 2014-03-27 (×6): qty 2

## 2014-03-27 MED ORDER — LABETALOL HCL 5 MG/ML IV SOLN: 5.0000 mg | INTRAVENOUS | Status: DC | PRN

## 2014-03-27 MED ORDER — HYDROCODONE-ACETAMINOPHEN 5-325 MG PO TABS
1.0000 | ORAL_TABLET | ORAL | Status: DC | PRN
  Administered 2014-04-01 (×2): 1 via ORAL
  Administered 2014-04-04: 2 via ORAL
  Administered 2014-04-05: 1 via ORAL
  Filled 2014-03-27 (×2): qty 1
  Filled 2014-03-27: qty 2
  Filled 2014-03-27: qty 1

## 2014-03-27 MED ORDER — DEXTROSE 5 % IV SOLN
2.0000 g | Freq: Two times a day (BID) | INTRAVENOUS | Status: DC
  Administered 2014-03-27: 2 g via INTRAVENOUS
  Filled 2014-03-27: qty 2

## 2014-03-27 MED ORDER — CEFEPIME HCL 2 G IJ SOLR: 2.0000 g | INTRAMUSCULAR | Status: DC

## 2014-03-27 MED ORDER — SODIUM CHLORIDE 0.9 % IV BOLUS (SEPSIS)
500.0000 mL | Freq: Once | INTRAVENOUS | Status: AC
  Administered 2014-03-27: 500 mL via INTRAVENOUS

## 2014-03-27 MED ORDER — LORAZEPAM 1 MG PO TABS
1.0000 mg | ORAL_TABLET | Freq: Every evening | ORAL | Status: DC | PRN
Start: 2014-03-27 — End: 2014-03-31
  Administered 2014-03-28 – 2014-03-29 (×3): 1 mg via ORAL
  Filled 2014-03-27 (×3): qty 1

## 2014-03-27 MED ORDER — VANCOMYCIN HCL IN DEXTROSE 750-5 MG/150ML-% IV SOLN
750.0000 mg | INTRAVENOUS | Status: DC
  Administered 2014-03-29: 750 mg via INTRAVENOUS
  Filled 2014-03-27 (×2): qty 150

## 2014-03-27 MED ORDER — SERTRALINE HCL 25 MG PO TABS
25.0000 mg | ORAL_TABLET | Freq: Every day | ORAL | Status: DC
  Administered 2014-03-28 – 2014-04-05 (×9): 25 mg via ORAL
  Filled 2014-03-27 (×11): qty 1

## 2014-03-27 MED ORDER — ONDANSETRON HCL 4 MG/2ML IJ SOLN
4.0000 mg | Freq: Once | INTRAMUSCULAR | Status: AC
  Administered 2014-03-27: 4 mg via INTRAVENOUS
  Filled 2014-03-27: qty 2

## 2014-03-27 MED ORDER — METRONIDAZOLE IN NACL 5-0.79 MG/ML-% IV SOLN
500.0000 mg | Freq: Three times a day (TID) | INTRAVENOUS | Status: DC
  Administered 2014-03-27 – 2014-03-29 (×5): 500 mg via INTRAVENOUS
  Filled 2014-03-27 (×7): qty 100

## 2014-03-27 MED ORDER — PENTOSAN POLYSULFATE SODIUM 100 MG PO CAPS
100.0000 mg | ORAL_CAPSULE | Freq: Three times a day (TID) | ORAL | Status: DC
  Filled 2014-03-27 (×4): qty 1

## 2014-03-27 MED ORDER — DEXTROSE 5 % IV SOLN: 2.0000 g | Freq: Two times a day (BID) | INTRAVENOUS | Status: DC

## 2014-03-27 MED ORDER — LEVOTHYROXINE SODIUM 50 MCG PO TABS
50.0000 ug | ORAL_TABLET | Freq: Every day | ORAL | Status: DC
  Administered 2014-03-28 – 2014-04-05 (×9): 50 ug via ORAL
  Filled 2014-03-27 (×11): qty 1

## 2014-03-27 MED ORDER — NIFEDIPINE ER OSMOTIC RELEASE 90 MG PO TB24
90.0000 mg | ORAL_TABLET | Freq: Every day | ORAL | Status: DC
  Administered 2014-03-27 – 2014-04-02 (×7): 90 mg via ORAL
  Filled 2014-03-27 (×8): qty 1

## 2014-03-27 MED ORDER — SODIUM CHLORIDE 0.9 % IJ SOLN
3.0000 mL | Freq: Two times a day (BID) | INTRAMUSCULAR | Status: DC
  Administered 2014-03-27 – 2014-04-05 (×18): 3 mL via INTRAVENOUS

## 2014-03-27 MED ORDER — VANCOMYCIN HCL IN DEXTROSE 1-5 GM/200ML-% IV SOLN
1000.0000 mg | INTRAVENOUS | Status: AC
  Administered 2014-03-27: 1000 mg via INTRAVENOUS
  Filled 2014-03-27: qty 200

## 2014-03-27 MED ORDER — DEXTROSE 5 % IV SOLN
2.0000 g | INTRAVENOUS | Status: DC
  Administered 2014-03-28 – 2014-03-30 (×3): 2 g via INTRAVENOUS
  Filled 2014-03-27 (×6): qty 2

## 2014-03-27 MED ORDER — ASPIRIN EC 81 MG PO TBEC
81.0000 mg | DELAYED_RELEASE_TABLET | Freq: Every day | ORAL | Status: DC
  Administered 2014-03-28 – 2014-04-02 (×6): 81 mg via ORAL
  Filled 2014-03-27 (×8): qty 1

## 2014-03-27 MED ORDER — ACETAMINOPHEN 650 MG RE SUPP: 650.0000 mg | Freq: Four times a day (QID) | RECTAL | Status: DC | PRN

## 2014-03-27 MED ORDER — MONTELUKAST SODIUM 10 MG PO TABS
10.0000 mg | ORAL_TABLET | Freq: Every evening | ORAL | Status: DC
  Administered 2014-03-28 – 2014-04-02 (×6): 10 mg via ORAL
  Filled 2014-03-27 (×8): qty 1

## 2014-03-27 MED ORDER — SODIUM CHLORIDE 0.9 % IV BOLUS (SEPSIS): 250.0000 mL | INTRAVENOUS | Status: DC | PRN

## 2014-03-27 MED ORDER — HEPARIN SODIUM (PORCINE) 5000 UNIT/ML IJ SOLN
5000.0000 [IU] | Freq: Three times a day (TID) | INTRAMUSCULAR | Status: DC
  Administered 2014-03-27 – 2014-03-28 (×3): 5000 [IU] via SUBCUTANEOUS
  Filled 2014-03-27 (×5): qty 1

## 2014-03-27 MED ORDER — ONDANSETRON HCL 4 MG/2ML IJ SOLN
4.0000 mg | Freq: Four times a day (QID) | INTRAMUSCULAR | Status: DC | PRN
Start: 2014-03-27 — End: 2014-04-05

## 2014-03-27 MED ORDER — ONDANSETRON HCL 4 MG PO TABS
4.0000 mg | ORAL_TABLET | Freq: Four times a day (QID) | ORAL | Status: DC | PRN
Start: 2014-03-27 — End: 2014-04-05

## 2014-03-27 MED ORDER — BISACODYL 10 MG RE SUPP: 10.0000 mg | Freq: Every day | RECTAL | Status: DC | PRN

## 2014-03-27 NOTE — ED Notes (Signed)
Lab called with critical troponin of 0.38, dr. Loretha Staplerwofford notified.

## 2014-03-27 NOTE — ED Notes (Signed)
Phlebotomy at bedside drawing blood cultures.  

## 2014-03-27 NOTE — ED Notes (Signed)
Pt transported to x-ray and CT scan. Vital signs stable at present.

## 2014-03-27 NOTE — H&P (Addendum)
[facility] History and Physical  Leslie NeatDorothy W Boldon HQI:[id number]:[id number] DOB: [birth year] DOA: 03/27/2014  Referring physician: ED physician PCP: Ginette OttoSTONEKING,HAL THOMAS, MD   Chief Complaint: Lethargy, fall at home  HPI:  Leslie [name] is a 80 yo woman with PMH of Renal mass, CKD, HTN, Hypothyroidism, recent UTI, HLD, PD, bradycardia who presents after a fall.  Patient is alert when I talked to her, but does not remember much of the event.  Per report and on talking to daughter, it appears that Leslie [name] has been more confused and less able to care for herself over the last week to week and a half.  The patient has a great grandson who was staying with her overnight, and when he awoke, he found Leslie [name] on the ground, face up beside her bed.  She was unconscious and did not regain consciousness until EMS arrived on the scene.  She has a friend who checks on her and this friend noticed that there was food, feces and open drawers all over the house today.  Leslie [name] reports feeling poorly recently, feeling hot last night and having diarrhea.  Her daughter states the diarrhea has been an on and off issue.  She also reports that Leslie [name] was recently treated for a UTI with an antibiotic that she cannot remember.  She just recently finished the antibiotics.  In the ED, Leslie [name] was hypoxic, tachypneic and with a very high blood pressure.  Her hypoxia improved with oxygen therapy and she is on a [place] now.  Leslie [name] denies chest pain, recent fever, abdominal pain, swelling.  She does report some nausea.  In the ED she was noted to have a low grade fever and tachycardia with significantly elevated BP.   Assessment and Plan: SIRS (systemic inflammatory response syndrome), likely due to infection - UA appears clean, UC and BC pending - Recent history of antibiotics and diarrhea, send Cdiff - Also on cefepime and vanc given recent illness, and hospitalization, can likely discontinue quickly if Cdiff positive - Monitor  in SDU - IVF as needed, right now BP is elevated - O2 as needed - Check TSH - Zofran for nausea - Pain control  Hypertensive emergency - Did not take any BP meds today - Labetalol drip until BP < 160 systolic - Start home meds, can hopefully titrate off drip soon - Elevated troponin, likely related to BP - Monitor for neuro changes  Elevated troponin - Likely related to HTN and CKD, but could be early NSTEMI - Cycle troponins overnight - AM EKG - For any change, would consider heparin drip - She is chest pain free  Hypoxia - improved with oxygen, unclear source, but likely due to acute infection  Found down - Renal function stable, CK normal - Trend troponin and CK  Metabolic acidosis - Possibly due to diarrheal illness, only mildly low bicarb (19) - Monitor with AM labs - Fluids as needed (none started due to elevated BP), low threshold to start NS overnight  Hypothyroidism - Check TSH - Continue levothyroxine  CKD (chronic kidney disease): baseline cr 1.3-1.5 - Appears at baseline, trend Cr   Radiological Exams on Admission: Dg Pelvis 1-2 Views  03/27/2014   CLINICAL DATA:  Status post fall  EXAM: PELVIS - 1-2 VIEW  COMPARISON:  None.  FINDINGS: The bony pelvis is osteopenic. There is mild symmetric narrowing of the hip joints. The SI joints are intact. The observed portions of the sacrum are normal. There are degenerative  changes of the lumbar spine.  IMPRESSION: There is no acute pelvic fracture.   Electronically Signed   By: David  Swazil[place]   On: 03/27/2014 16:16   Ct Head Wo Contrast  03/27/2014   CLINICAL DATA:  Larey SeatFell.  Unresponsive.  EXAM: CT HEAD WITHOUT CONTRAST  CT CERVICAL SPINE WITHOUT CONTRAST  TECHNIQUE: Multidetector CT imaging of the head and cervical spine was performed following the standard protocol without intravenous contrast. Multiplanar CT image reconstructions of the cervical spine were also generated.  COMPARISON:  Head CT 04/28/2012  FINDINGS: CT  HEAD FINDINGS  Stable age related cerebral atrophy, ventriculomegaly and periventricular white matter disease. No extra-axial fluid collections are identified. No CT findings for acute hemispheric infarction or intracranial hemorrhage. No mass lesions. The brainstem and cerebellum are normal.  No acute bony findings. No fracture or bone lesion. The paranasal sinuses and mastoid air cells are clear. The globes are intact.  CT CERVICAL SPINE FINDINGS  Degenerative cervical spondylosis with multilevel disc disease and facet disease. The alignment is maintained. No acute fracture or abnormal prevertebral soft tissue swelling. The skullbase C1 and C1-2 articulations are maintained. The dens is intact. Multilevel foraminal stenosis due to uncinate spurring and facet disease.  Carotid artery calcifications are noted bilaterally. No neck mass or adenopathy. The lung apices are grossly clear.  IMPRESSION: 1. Stable age related cerebral atrophy, ventriculomegaly and periventricular white matter disease. 2. No acute intracranial findings or skull fracture. 3. Degenerative cervical spondylosis but normal alignment and no acute cervical spine fracture.   Electronically Signed   By: Loralie ChampagneMark  Gallerani M.D.   On: 03/27/2014 16:08   Ct Cervical Spine Wo Contrast  03/27/2014   CLINICAL DATA:  Larey SeatFell.  Unresponsive.  EXAM: CT HEAD WITHOUT CONTRAST  CT CERVICAL SPINE WITHOUT CONTRAST  TECHNIQUE: Multidetector CT imaging of the head and cervical spine was performed following the standard protocol without intravenous contrast. Multiplanar CT image reconstructions of the cervical spine were also generated.  COMPARISON:  Head CT 04/28/2012  FINDINGS: CT HEAD FINDINGS  Stable age related cerebral atrophy, ventriculomegaly and periventricular white matter disease. No extra-axial fluid collections are identified. No CT findings for acute hemispheric infarction or intracranial hemorrhage. No mass lesions. The brainstem and cerebellum are normal.   No acute bony findings. No fracture or bone lesion. The paranasal sinuses and mastoid air cells are clear. The globes are intact.  CT CERVICAL SPINE FINDINGS  Degenerative cervical spondylosis with multilevel disc disease and facet disease. The alignment is maintained. No acute fracture or abnormal prevertebral soft tissue swelling. The skullbase C1 and C1-2 articulations are maintained. The dens is intact. Multilevel foraminal stenosis due to uncinate spurring and facet disease.  Carotid artery calcifications are noted bilaterally. No neck mass or adenopathy. The lung apices are grossly clear.  IMPRESSION: 1. Stable age related cerebral atrophy, ventriculomegaly and periventricular white matter disease. 2. No acute intracranial findings or skull fracture. 3. Degenerative cervical spondylosis but normal alignment and no acute cervical spine fracture.   Electronically Signed   By: Loralie ChampagneMark  Gallerani M.D.   On: 03/27/2014 16:08   Dg Chest Portable 1 View  03/27/2014   CLINICAL DATA:  Fall, cough/congestion  EXAM: PORTABLE CHEST - 1 VIEW  COMPARISON:  12/18/2013  FINDINGS: Chronic interstitial markings/ emphysematous changes. No focal consolidation. Mild elevation of the right hemidiaphragm. No pleural effusion or pneumothorax.  The heart is top-normal in size.  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  IMPRESSION: No evidence  of acute cardiopulmonary disease.   Electronically Signed   By: Charline Bills M.D.   On: 03/27/2014 14:39     Code Status: DNR Family Communication: Pt and daughter at bedside Disposition Plan: Admit for further evaluation     Limited Review of Systems due to patient factor, could not remember event:  Constitutional: Positive for feeling hot malaise/fatigue.  negative for chills and diaphoresis.  Eyes: Negative for blurred vision Respiratory: Positive for SOB.  Negative for cough Cardiovascular: Negative for chest pain,  palpitations Gastrointestinal: Positive for nausea, vomiting. + for diarrhea Genitourinary: Positive for dysuria     Reviewed with family Past Medical History  Diagnosis Date  . Hypertension   . Bowel obstruction 1987  . Hypothyroidism   . Interstitial cystitis   . Hypercholesteremia   . Headache(784.0)   . Anxiety   . Depression   . Multiple respiratory allergies   . Restless leg syndrome   . Stroke 06/2002    right parietal nonhemorrhagic acute infarction  . Abdominal mass   . Fibromyalgia   . Parkinson's disease     dx'd 1990's; told she doesn't have it ~ 2000  . Bradycardia, sinus   . CKD (chronic kidney disease): baseline cr 1.3-1.5 12/18/2013    Past Surgical History  Procedure Laterality Date  . Abdominal surgery  1970's    "for blockage"  . Cystoscopy with urethral dilatation  02/2006; 07/2005; 01/2005  . Cholecystectomy    . Tonsillectomy    . Appendectomy    . Abdominal hysterectomy    . Dilation and curettage of uterus  1940's  . Shoulder surgery  1970's    "for fx"    Social History: Lives alone in apartment, has a friend who helps her with chores.  No smoking or ETOH per chart review.   Allergies  Allergen Reactions  . Compazine     hives  . Penicillins     rash  . Sulfa Antibiotics     rash  . Iohexol Rash    Patient states last time she had "CT contrast" she got a rash.    FH non contributory in this 80 yo woman  Prior to Admission medications   Medication Sig Start Date End Date Taking? Authorizing Provider  acetaminophen (TYLENOL) 500 MG tablet Take 1,000 mg by mouth at bedtime.   Yes Historical Provider, MD  aspirin 81 MG tablet Take 81 mg by mouth daily.   Yes Historical Provider, MD  bisacodyl (DULCOLAX) 10 MG suppository Place 1 suppository (10 mg total) rectally daily as needed for moderate constipation. 12/22/13  Yes Renae Fickle, MD  famotidine (PEPCID) 20 MG tablet Take 20 mg by mouth daily.   Yes Historical Provider, MD   hydrALAZINE (APRESOLINE) 25 MG tablet Take 25 mg by mouth 3 (three) times daily.   Yes Historical Provider, MD  levothyroxine (SYNTHROID, LEVOTHROID) 50 MCG tablet Take 50 mcg by mouth daily.     Yes Historical Provider, MD  LORazepam (ATIVAN) 1 MG tablet Take 1 mg by mouth 3 (three) times daily.   Yes Historical Provider, MD  montelukast (SINGULAIR) 10 MG tablet Take 10 mg by mouth every evening.    Yes Historical Provider, MD  NIFEdipine (PROCARDIA XL/ADALAT-CC) 90 MG 24 hr tablet Take 90 mg by mouth at bedtime.   Yes Historical Provider, MD  pentosan polysulfate (ELMIRON) 100 MG capsule Take 100 mg by mouth 3 (three) times daily before meals.     Yes Historical Provider, MD  QUEtiapine (SEROQUEL) 25 MG tablet Take 25 mg by mouth at bedtime.    Yes Historical Provider, MD  rOPINIRole (REQUIP) 1 MG tablet Take 1-2 mg by mouth See admin instructions. Take 1 tablet every morning Take 2 tablets at bedtime   Yes Historical Provider, MD  sertraline (ZOLOFT) 25 MG tablet Take 25 mg by mouth daily.   Yes Historical Provider, MD    Physical Exam: Filed Vitals:   03/27/14 1632 03/27/14 1741 03/27/14 1746 03/27/14 1800  BP: 163/77 177/81  189/60  Pulse: 86 75    Temp:   101 F (38.3 C) 97.3 F (36.3 C)  TempSrc:   Rectal Oral  Resp: 20 18    Height:    5' (1.524 m)  Weight:    124 lb 1.9 oz (56.3 kg)  SpO2: 92% 94%  91%    Physical Exam  Constitutional: Thin elderly woman, alert, knows she is in the hospital, NAD HENT:  Dry mucous membranes Neck: no cervical LAD  CVS: RRR, S1/S2 +, no murmurs Pulmonary: Tachypneic, Polk in place, no wheezing, breath sounds heard bilaterally Abdominal: Soft. BS +,  no distension, tenderness, guarding  Neuro: Alert. Normal muscle tone. No cranial nerve deficit. Skin: Skin is warm and dry. No rash noted. Not diaphoretic. No erythema. No pallor.   Labs on Admission:  Basic Metabolic Panel:  Recent Labs Lab 03/27/14 1501  NA 138  K 4.0  CL 98  CO2 19   GLUCOSE 188*  BUN 27*  CREATININE 1.61*  CALCIUM 9.3   Liver Function Tests:  Recent Labs Lab 03/27/14 1501  AST 23  ALT 14  ALKPHOS 117  BILITOT 0.4  PROT 8.2  ALBUMIN 3.7   CBC:  Recent Labs Lab 03/27/14 1501  WBC 23.7*  NEUTROABS 21.2*  HGB 15.5*  HCT 46.9*  MCV 90.0  PLT 336   Cardiac Enzymes:  Recent Labs Lab 03/27/14 1501  CKTOTAL 85  TROPONINI 0.38*    EKG:    Debe Coder, MD  [facility] Pager [phone]  If 7PM-7AM, please contact night-coverage [email or web address] Password [facility] 03/27/2014, 6:42 PM

## 2014-03-27 NOTE — Progress Notes (Signed)
Md aware of pts Sbp 90s and now 87/37.  Turned  labetalol gtt off and will continue to monitor pt.  No distress noted.  Alert and oriented.  New orders received. Karena Addisonoro, Keoni Risinger T

## 2014-03-27 NOTE — ED Notes (Signed)
Pt presents to department for evaluation of fall. Family member found patient laying unresponsive in floor at home. Per EMS several episodes of vomiting, pt also defecated on herself en route to hospital. Upon arrival rhonchi heard all lung fields, productive cough. Respirations unlabored at present. c-collar and KED upon arrival.

## 2014-03-27 NOTE — Progress Notes (Signed)
Notified Md about pts bp.  Clarification to start Labetalol or give bp meds early.  Per md will start gtt and monitor pt.  Pt alert and oriented no distress noted.  Will continue to monitor. Leslie [name], Leslie [name]

## 2014-03-27 NOTE — ED Provider Notes (Signed)
CSN: [id number]     Arrival date & time 03/27/14  1350 History   First MD Initiated Contact with Patient 03/27/14 1456     Chief Complaint  Patient presents with  . Fall     (Consider location/radiation/quality/duration/timing/severity/associated sxs/prior Treatment) Patient is a 80 y.o. female presenting with fall. The history is provided by the patient.  Fall This is a new problem. The current episode started 6 to 12 hours ago. Episode frequency: once. The problem has been resolved. Pertinent negatives include no chest pain, no abdominal pain, no headaches and no shortness of breath. Nothing aggravates the symptoms. Nothing relieves the symptoms. She has tried nothing for the symptoms. The treatment provided no relief.    Past Medical History  Diagnosis Date  . Hypertension   . Bowel obstruction 1987  . Hypothyroidism   . Interstitial cystitis   . Hypercholesteremia   . Headache(784.0)   . Anxiety   . Depression   . Multiple respiratory allergies   . Restless leg syndrome   . Stroke 06/2002    right parietal nonhemorrhagic acute infarction  . Abdominal mass   . Fibromyalgia   . Parkinson's disease     dx'd 1990's; told she doesn't have it ~ 2000  . Bradycardia, sinus   . CKD (chronic kidney disease): baseline cr 1.3-1.5 12/18/2013   Past Surgical History  Procedure Laterality Date  . Abdominal surgery  1970's    "for blockage"  . Cystoscopy with urethral dilatation  02/2006; 07/2005; 01/2005  . Cholecystectomy    . Tonsillectomy    . Appendectomy    . Abdominal hysterectomy    . Dilation and curettage of uterus  1940's  . Shoulder surgery  1970's    "for fx"   No family history on file. History  Substance Use Topics  . Smoking status: Never Smoker   . Smokeless tobacco: Never Used  . Alcohol Use: No   OB History   Grav Para Term Preterm Abortions TAB SAB Ect Mult Living                 Review of Systems  Constitutional: Negative for fever and fatigue.   HENT: Negative for congestion and drooling.   Eyes: Negative for pain.  Respiratory: Negative for cough and shortness of breath.   Cardiovascular: Negative for chest pain.  Gastrointestinal: Positive for nausea and vomiting. Negative for abdominal pain and diarrhea.  Genitourinary: Negative for dysuria and hematuria.  Musculoskeletal: Negative for back pain, gait problem and neck pain.  Skin: Negative for color change.  Neurological: Negative for dizziness and headaches.  Hematological: Negative for adenopathy.  Psychiatric/Behavioral: Negative for behavioral problems.  All other systems reviewed and are negative.     Allergies  Compazine; Penicillins; Sulfa antibiotics; and Iohexol  Home Medications   Prior to Admission medications   Medication Sig Start Date End Date Taking? Authorizing Provider  acetaminophen (TYLENOL) 500 MG tablet Take 1,000 mg by mouth at bedtime.   Yes Historical Provider, MD  aspirin 81 MG tablet Take 81 mg by mouth daily.   Yes Historical Provider, MD  bisacodyl (DULCOLAX) 10 MG suppository Place 1 suppository (10 mg total) rectally daily as needed for moderate constipation. 12/22/13  Yes Renae Fickle, MD  famotidine (PEPCID) 20 MG tablet Take 20 mg by mouth daily.   Yes Historical Provider, MD  hydrALAZINE (APRESOLINE) 25 MG tablet Take 25 mg by mouth 3 (three) times daily.   Yes Historical Provider, MD  levothyroxine (SYNTHROID,  LEVOTHROID) 50 MCG tablet Take 50 mcg by mouth daily.     Yes Historical Provider, MD  LORazepam (ATIVAN) 1 MG tablet Take 1 mg by mouth 3 (three) times daily.   Yes Historical Provider, MD  montelukast (SINGULAIR) 10 MG tablet Take 10 mg by mouth every evening.    Yes Historical Provider, MD  NIFEdipine (PROCARDIA XL/ADALAT-CC) 90 MG 24 hr tablet Take 90 mg by mouth at bedtime.   Yes Historical Provider, MD  pentosan polysulfate (ELMIRON) 100 MG capsule Take 100 mg by mouth 3 (three) times daily before meals.     Yes Historical  Provider, MD  QUEtiapine (SEROQUEL) 25 MG tablet Take 25 mg by mouth at bedtime.    Yes Historical Provider, MD  rOPINIRole (REQUIP) 1 MG tablet Take 1-2 mg by mouth See admin instructions. Take 1 tablet every morning Take 2 tablets at bedtime   Yes Historical Provider, MD  sertraline (ZOLOFT) 25 MG tablet Take 25 mg by mouth daily.   Yes Historical Provider, MD   BP 191/85  Pulse 86  Temp(Src) 98.4 F (36.9 C) (Oral)  Resp 30  SpO2 90% Physical Exam  Nursing note and vitals reviewed. Constitutional: She appears well-developed and well-nourished.  HENT:  Head: Normocephalic.  Mouth/Throat: No oropharyngeal exudate.  Dry oral mucous membranes.  Eyes: Conjunctivae and EOM are normal. Pupils are equal, round, and reactive to light.  Neck: Normal range of motion. Neck supple.  Mild mid cervical tenderness to palpation. No other vertebral tenderness.  Cardiovascular: Normal rate, regular rhythm, normal heart sounds and intact distal pulses.  Exam reveals no gallop and no friction rub.   No murmur heard. Pulmonary/Chest: She is in respiratory distress.  Tachypneic with diffuse rhonchorous sounds bilaterally.  Abdominal: Soft. Bowel sounds are normal. There is no tenderness. There is no rebound and no guarding.  Musculoskeletal: Normal range of motion. She exhibits no edema and no tenderness.  Neurological: She is alert.  alert, oriented x1 speech: normal in context and clarity memory: intact grossly cranial nerves II-XII: intact motor strength: 2/5 strength in RUE (at baseline per family) d/t previous injury, full proximally and distally in all other extremities sensation: intact to light touch diffusely  Gait: deferred   Skin: Skin is warm and dry.  Psychiatric: She has a normal mood and affect. Her behavior is normal.    ED Course  Procedures (including critical care time) Labs Review Labs Reviewed  CBC WITH DIFFERENTIAL - Abnormal; Notable for the following:    WBC 23.7 (*)     RBC 5.21 (*)    Hemoglobin 15.5 (*)    HCT 46.9 (*)    Neutrophils Relative % 89 (*)    Neutro Abs 21.2 (*)    Lymphocytes Relative 6 (*)    Monocytes Absolute 1.1 (*)    All other components within normal limits  URINALYSIS, ROUTINE W REFLEX MICROSCOPIC - Abnormal; Notable for the following:    Hgb urine dipstick MODERATE (*)    Protein, ur >300 (*)    All other components within normal limits  COMPREHENSIVE METABOLIC PANEL - Abnormal; Notable for the following:    Glucose, Bld 188 (*)    BUN 27 (*)    Creatinine, Ser 1.61 (*)    GFR calc non Af Amer 27 (*)    GFR calc Af Amer 31 (*)    Anion gap 21 (*)    All other components within normal limits  PRO B NATRIURETIC PEPTIDE - Abnormal; Notable  for the following:    Pro B Natriuretic peptide (BNP) 14898.0 (*)    All other components within normal limits  TROPONIN I - Abnormal; Notable for the following:    Troponin I 0.38 (*)    All other components within normal limits  CBC - Abnormal; Notable for the following:    WBC 32.0 (*)    All other components within normal limits  CREATININE, SERUM - Abnormal; Notable for the following:    Creatinine, Ser 1.71 (*)    GFR calc non Af Amer 25 (*)    GFR calc Af Amer 29 (*)    All other components within normal limits  COMPREHENSIVE METABOLIC PANEL - Abnormal; Notable for the following:    Glucose, Bld 101 (*)    BUN 36 (*)    Creatinine, Ser 2.36 (*)    Albumin 2.8 (*)    AST 44 (*)    GFR calc non Af Amer 17 (*)    GFR calc Af Amer 20 (*)    All other components within normal limits  CBC - Abnormal; Notable for the following:    WBC 25.6 (*)    RDW 15.7 (*)    All other components within normal limits  TROPONIN I - Abnormal; Notable for the following:    Troponin I 3.46 (*)    All other components within normal limits  TROPONIN I - Abnormal; Notable for the following:    Troponin I 3.56 (*)    All other components within normal limits  TROPONIN I - Abnormal; Notable  for the following:    Troponin I 4.53 (*)    All other components within normal limits  CK - Abnormal; Notable for the following:    Total CK 203 (*)    All other components within normal limits  CK - Abnormal; Notable for the following:    Total CK 382 (*)    All other components within normal limits  CK - Abnormal; Notable for the following:    Total CK 1124 (*)    All other components within normal limits  TROPONIN I - Abnormal; Notable for the following:    Troponin I 4.69 (*)    All other components within normal limits  I-STAT CG4 LACTIC ACID, ED - Abnormal; Notable for the following:    Lactic Acid, Venous 4.47 (*)    All other components within normal limits  I-STAT ARTERIAL BLOOD GAS, ED - Abnormal; Notable for the following:    pH, Arterial 7.299 (*)    pCO2 arterial 45.6 (*)    pO2, Arterial 64.0 (*)    Acid-base deficit 4.0 (*)    All other components within normal limits  MRSA PCR SCREENING  CLOSTRIDIUM DIFFICILE BY PCR  CULTURE, BLOOD (ROUTINE X 2)  CULTURE, BLOOD (ROUTINE X 2)  CK  URINE MICROSCOPIC-ADD ON  TSH  BASIC METABOLIC PANEL  CK  TROPONIN I    Imaging Review Dg Chest Portable 1 View  03/27/2014   CLINICAL DATA:  Fall, cough/congestion  EXAM: PORTABLE CHEST - 1 VIEW  COMPARISON:  12/18/2013  FINDINGS: Chronic interstitial markings/ emphysematous changes. No focal consolidation. Mild elevation of the right hemidiaphragm. No pleural effusion or pneumothorax.  The heart is top-normal in size.  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  IMPRESSION: No evidence of acute cardiopulmonary disease.   Electronically Signed   By: Charline Bills M.D.   On: 03/27/2014 14:39     EKG Interpretation  Date/Time:  Saturday March 27 2014 14:47:53 EDT Ventricular Rate:  87 PR Interval:    QRS Duration: 80 QT Interval:  395 QTC Calculation: 475 R Axis:   8 Text Interpretation:  Atrial fibrillation Low voltage, precordial leads   Anteroseptal infarct, old Premature ventricular complexes No significant  change since last tracing Confirmed by Bethanee Redondo  MD, Sugey Trevathan ([id number]) on  03/27/2014 2:58:22 PM     CRITICAL CARE Performed by: Purvis SheffieldHARRISON, Gracyn Santillanes, S Total critical care time: 30 min Critical care time was exclusive of separately billable procedures and treating other patients. Critical care was necessary to treat or prevent imminent or life-threatening deterioration. Critical care was time spent personally by me on the following activities: development of treatment plan with patient and/or surrogate as well as nursing, discussions with consultants, evaluation of patient's response to treatment, examination of patient, obtaining history from patient or surrogate, ordering and performing treatments and interventions, ordering and review of laboratory studies, ordering and review of radiographic studies, pulse oximetry and re-evaluation of patient's condition.  MDM   Final diagnoses:  Fall, initial encounter  Elevated troponin  Congestive heart failure, unspecified congestive heart failure chronicity, unspecified congestive heart failure type  SOB (shortness of breath)  Hypoxia  SIRS (systemic inflammatory response syndrome)  Hypertensive emergency  Metabolic acidosis  Leukocytosis    3:30 PM 80 y.o. female w hx of sbo, CVA, CKD, parkinsons who presents after being found down this morning. The family reports that her grandson was staying with her and she was last seen normal going to bed. It is believed that she fell after getting up in the night to use the bathroom. She was found face up on the ground. She is alert and oriented x1 on exam. She is interactive and following commands appropriately. She states that she feels bad but has no focal pain. She denies any chest pain or abd pain. She does have some shortness of breath. She is hypertensive, tachypneic with a heart rate in the 80s on exam. She was initially stable on  4 L via nasal cannula but her oxygen requirement increased after having an episode of emesis while I was at the bedside. She was briefly placed on a nonrebreather which is being weaned back down to a nasal cannula. Pt meets SIRS criteria w/ RR and elev WBC. Will give Abx and get blood cx's.   Newly elevated troponin. Ddx includes CHF, NSTEMI, Hypertensive emergency, renal insuff. I notified on call cardiologist who recommended the inpt team consult him if they want him to see the pt. Will admit to hospitalist to stepdown unit.   Junius ArgyleForrest S Sadiya Durand, MD 03/28/14 1012

## 2014-03-27 NOTE — ED Notes (Signed)
Per family patient found laying face up on ground at home. Dried vomit noted on clothes and face upon arrival to ED. Pt is alert and responsive to pain.

## 2014-03-27 NOTE — Progress Notes (Addendum)
ANTIBIOTIC CONSULT NOTE - INITIAL  Pharmacy Consult for Vancomycin Indication: SIRS, likely due to infection  Allergies  Allergen Reactions  . Compazine     hives  . Penicillins     rash  . Sulfa Antibiotics     rash  . Iohexol Rash    Patient states last time she had "CT contrast" she got a rash.    Patient Measurements: Height: 5' (152.4 cm) Weight: 124 lb 1.9 oz (56.3 kg) IBW/kg (Calculated) : 45.5   Vital Signs: Temp: 97.3 F (36.3 C) (07/04 1800) Temp src: Oral (07/04 1800) BP: 189/60 mmHg (07/04 1800) Pulse Rate: 75 (07/04 1741) Intake/Output from previous day:   Intake/Output from this shift:    Labs:  Recent Labs  03/27/14 1501  WBC 23.7*  HGB 15.5*  PLT 336  CREATININE 1.61*   Estimated Creatinine Clearance: 18.3 ml/min (by C-G formula based on Cr of 1.61). No results found for this basename: VANCOTROUGH, VANCOPEAK, VANCORANDOM, GENTTROUGH, GENTPEAK, GENTRANDOM, TOBRATROUGH, TOBRAPEAK, TOBRARND, AMIKACINPEAK, AMIKACINTROU, AMIKACIN,  in the last 72 hours   Microbiology: No results found for this or any previous visit (from the past 720 hour(s)).  Medical History: Past Medical History  Diagnosis Date  . Hypertension   . Bowel obstruction 1987  . Hypothyroidism   . Interstitial cystitis   . Hypercholesteremia   . Headache(784.0)   . Anxiety   . Depression   . Multiple respiratory allergies   . Restless leg syndrome   . Stroke 06/2002    right parietal nonhemorrhagic acute infarction  . Abdominal mass   . Fibromyalgia   . Parkinson's disease     dx'd 1990's; told she doesn't have it ~ 2000  . Bradycardia, sinus   . CKD (chronic kidney disease): baseline cr 1.3-1.5 12/18/2013    Medications:  Prescriptions prior to admission  Medication Sig Dispense Refill  . acetaminophen (TYLENOL) 500 MG tablet Take 1,000 mg by mouth at bedtime.      Marland Kitchen. aspirin 81 MG tablet Take 81 mg by mouth daily.      . bisacodyl (DULCOLAX) 10 MG suppository Place  1 suppository (10 mg total) rectally daily as needed for moderate constipation.  12 suppository  0  . famotidine (PEPCID) 20 MG tablet Take 20 mg by mouth daily.      . hydrALAZINE (APRESOLINE) 25 MG tablet Take 25 mg by mouth 3 (three) times daily.      Marland Kitchen. levothyroxine (SYNTHROID, LEVOTHROID) 50 MCG tablet Take 50 mcg by mouth daily.        Marland Kitchen. LORazepam (ATIVAN) 1 MG tablet Take 1 mg by mouth 3 (three) times daily.      . montelukast (SINGULAIR) 10 MG tablet Take 10 mg by mouth every evening.       Marland Kitchen. NIFEdipine (PROCARDIA XL/ADALAT-CC) 90 MG 24 hr tablet Take 90 mg by mouth at bedtime.      . pentosan polysulfate (ELMIRON) 100 MG capsule Take 100 mg by mouth 3 (three) times daily before meals.        Marland Kitchen. QUEtiapine (SEROQUEL) 25 MG tablet Take 25 mg by mouth at bedtime.       Marland Kitchen. rOPINIRole (REQUIP) 1 MG tablet Take 1-2 mg by mouth See admin instructions. Take 1 tablet every morning Take 2 tablets at bedtime      . sertraline (ZOLOFT) 25 MG tablet Take 25 mg by mouth daily.       Assessment: 80 y.o female with complaint of lethargy and fall  at home, admitted for SIRS likely due to infection. Starting on cefepime and vancomycin given recent illness and hospitalization. Recent history of antibiotics for UTI and diarrhea. Vancomycin 1000 mg IV given today @ 17:24 in ED Patient with CKD, baseline SCr 1.3-1.5, appears at baseline. SCr 1.61, estimated Crcl ~ 18 ml/min.  WBC 23.7K Also starting on Flagyl  Goal of Therapy:  Vancomycin trough level 15-20 mcg/ml  Plan:  Vancomycin 750 mg IV q48h -next due 7/6 @ 17:00 Adjust cefepime to 2 gm IV q24h due CKD, CrCl ~ 18 ml/min.  Monitor renal fxn, clinical data, culture resultes daily.  Steady state vanc trough when needed.  Arman FilterClark, Jarelyn Bambach [place] 03/27/2014,7:34 PM

## 2014-03-27 NOTE — Progress Notes (Signed)
Md made aware of pts trop 3.46.  Pt resting comfortable. No pain no distress noted.  BP now 120/52.  On Labetalol gtt. Will continue to monitor. Karena Addisonoro, Sidharth Leverette T

## 2014-03-28 DIAGNOSIS — N189 Chronic kidney disease, unspecified: Secondary | ICD-10-CM

## 2014-03-28 DIAGNOSIS — N179 Acute kidney failure, unspecified: Secondary | ICD-10-CM | POA: Diagnosis present

## 2014-03-28 DIAGNOSIS — I059 Rheumatic mitral valve disease, unspecified: Secondary | ICD-10-CM

## 2014-03-28 DIAGNOSIS — I498 Other specified cardiac arrhythmias: Secondary | ICD-10-CM

## 2014-03-28 LAB — COMPREHENSIVE METABOLIC PANEL
ALBUMIN: 3.1 g/dL — AB (ref 3.5–5.2)
ALK PHOS: 78 U/L (ref 39–117)
ALT: 14 U/L (ref 0–35)
ALT: 15 U/L (ref 0–35)
ANION GAP: 14 (ref 5–15)
AST: 44 U/L — ABNORMAL HIGH (ref 0–37)
AST: 48 U/L — ABNORMAL HIGH (ref 0–37)
Albumin: 2.8 g/dL — ABNORMAL LOW (ref 3.5–5.2)
Alkaline Phosphatase: 73 U/L (ref 39–117)
Anion gap: 15 (ref 5–15)
BILIRUBIN TOTAL: 0.6 mg/dL (ref 0.3–1.2)
BUN: 36 mg/dL — AB (ref 6–23)
BUN: 37 mg/dL — ABNORMAL HIGH (ref 6–23)
CALCIUM: 8.6 mg/dL (ref 8.4–10.5)
CHLORIDE: 103 meq/L (ref 96–112)
CO2: 24 mEq/L (ref 19–32)
CO2: 23 mEq/L (ref 19–32)
Calcium: 8.5 mg/dL (ref 8.4–10.5)
Chloride: 102 mEq/L (ref 96–112)
Creatinine, Ser: 2.36 mg/dL — ABNORMAL HIGH (ref 0.50–1.10)
Creatinine, Ser: 2.27 mg/dL — ABNORMAL HIGH (ref 0.50–1.10)
GFR calc non Af Amer: 17 mL/min — ABNORMAL LOW (ref >90)
GFR calc non Af Amer: 18 mL/min — ABNORMAL LOW (ref >90)
GFR, EST AFRICAN AMERICAN: 20 mL/min — AB (ref >90)
GFR, EST AFRICAN AMERICAN: 21 mL/min — AB (ref >90)
GLUCOSE: 98 mg/dL (ref 70–99)
Glucose, Bld: 101 mg/dL — ABNORMAL HIGH (ref 70–99)
POTASSIUM: 4.4 meq/L (ref 3.7–5.3)
Potassium: 4.5 mEq/L (ref 3.7–5.3)
SODIUM: 140 meq/L (ref 137–147)
SODIUM: 141 meq/L (ref 137–147)
TOTAL PROTEIN: 7.2 g/dL (ref 6.0–8.3)
Total Bilirubin: 0.5 mg/dL (ref 0.3–1.2)
Total Protein: 6.6 g/dL (ref 6.0–8.3)

## 2014-03-28 LAB — CBC
HCT: 39.9 % (ref 36.0–46.0)
HCT: 38 % (ref 36.0–46.0)
HEMOGLOBIN: 12.9 g/dL (ref 12.0–15.0)
Hemoglobin: 12.3 g/dL (ref 12.0–15.0)
MCH: 28.5 pg (ref 26.0–34.0)
MCH: 28.6 pg (ref 26.0–34.0)
MCHC: 32.3 g/dL (ref 30.0–36.0)
MCHC: 32.4 g/dL (ref 30.0–36.0)
MCV: 88.1 fL (ref 78.0–100.0)
MCV: 88.4 fL (ref 78.0–100.0)
PLATELETS: 284 10*3/uL (ref 150–400)
Platelets: 249 10*3/uL (ref 150–400)
RBC: 4.53 MIL/uL (ref 3.87–5.11)
RBC: 4.3 MIL/uL (ref 3.87–5.11)
RDW: 15.7 % — ABNORMAL HIGH (ref 11.5–15.5)
RDW: 15.9 % — ABNORMAL HIGH (ref 11.5–15.5)
WBC: 25.6 10*3/uL — ABNORMAL HIGH (ref 4.0–10.5)
WBC: 22.1 10*3/uL — ABNORMAL HIGH (ref 4.0–10.5)

## 2014-03-28 LAB — TROPONIN I
TROPONIN I: 4.53 ng/mL — AB (ref <0.30)
Troponin I: 3.56 ng/mL (ref <0.30)
Troponin I: 4.69 ng/mL (ref <0.30)
Troponin I: 3.75 ng/mL (ref <0.30)

## 2014-03-28 LAB — CLOSTRIDIUM DIFFICILE BY PCR: CDIFFPCR: NEGATIVE

## 2014-03-28 LAB — CK
CK TOTAL: 382 U/L — AB (ref 7–177)
Total CK: 1124 U/L — ABNORMAL HIGH (ref 7–177)
Total CK: 1122 U/L — ABNORMAL HIGH (ref 7–177)

## 2014-03-28 LAB — TSH: TSH: 3.29 u[IU]/mL (ref 0.350–4.500)

## 2014-03-28 MED ORDER — SODIUM CHLORIDE 0.9 % IV SOLN: INTRAVENOUS | Status: DC

## 2014-03-28 MED ORDER — HEPARIN (PORCINE) IN NACL 100-0.45 UNIT/ML-% IJ SOLN
850.0000 [IU]/h | INTRAMUSCULAR | Status: DC
  Administered 2014-03-28: 800 [IU]/h via INTRAVENOUS
  Administered 2014-03-29 (×2): 850 [IU]/h via INTRAVENOUS
  Filled 2014-03-28 (×2): qty 250

## 2014-03-28 MED ORDER — DIPHENHYDRAMINE HCL 25 MG PO CAPS
25.0000 mg | ORAL_CAPSULE | Freq: Three times a day (TID) | ORAL | Status: DC | PRN
  Administered 2014-03-28 – 2014-04-04 (×3): 25 mg via ORAL
  Filled 2014-03-28 (×3): qty 1

## 2014-03-28 MED ORDER — ATORVASTATIN CALCIUM 40 MG PO TABS
40.0000 mg | ORAL_TABLET | Freq: Every day | ORAL | Status: DC
  Administered 2014-03-28 – 2014-04-02 (×6): 40 mg via ORAL
  Filled 2014-03-28 (×7): qty 1

## 2014-03-28 MED ORDER — SODIUM CHLORIDE 0.9 % IV SOLN
INTRAVENOUS | Status: AC
  Administered 2014-03-28: 75 mL via INTRAVENOUS

## 2014-03-28 MED ORDER — SODIUM CHLORIDE 0.9 % IV SOLN: INTRAVENOUS | Status: AC

## 2014-03-28 NOTE — Progress Notes (Signed)
Lab tried x2 to stick pt to draw blood for protime-INR and APTT. Dr. Debe CoderEmily Mullen was notified. No new orders.

## 2014-03-28 NOTE — Progress Notes (Signed)
[facility] PROGRESS NOTE  Leslie [name] ZOX:[id number] DOB: 04-02-1922 DOA: 03/27/2014 PCP: Ginette Otto, MD  Brief narrative: Leslie [name] is a 80yo woman with PMH of a renal mass (likely cancer per family), HTN, CKD, hypothyroidism, recent UTI, HLD, PD and bradycardia who presented after a presumed fall.  Leslie [name] had been more confused and unable to care for herself for about a week per report.  She had been having diarrhea which is not new for her, but she had also recently completed a course of antibiotics for a UTI.  In the ED, she was noted to have elevated BP, a low grade temperature, tachycardia and an elevated WBC, which was concerning for infection.  She was also hypoxic.  CXR did not show a source for infection, UA was clean, BC X 2 were sent and Cdiff as well.  She had a mildly elevated TnI in the ED to 0.38 without any acute EKG changes.  Overnight, she developed a troponemia with peak at 4.69 and new TWI on EKG in the lateral leads, I and II.  She has been started on broad spectrum Abx and flagyl until cultures come back. AM Labs on 03/28/14 also show an ARF with Cr of 2.36.  Possible course of illness based on hospital course would be acute diarrheal illness (possibly cdiff?) leading to dehydration and now with ARF and elevated CK.  She also has possible NSTEMI with elevated TnI and cardiology will be consulted.   Assessment/Plan:  SIRS (systemic inflammatory response syndrome) - She had a low grade temperature in the ED along with some tachycardia.  This is all improved today.  Symptoms could be explained by an acute MI as well (see below problem).  If nothing reveals itself on culture, can consider d/c Abx.  - Cultures are pending - Stool culture, she has not had diarrhea since being here.  If no diarrhea today will consider d/c flagyl and enteric precautions - Cefepime/Vanc/Flagyl - IVF at 150cc/hr today for acute kidney injury (below), will recheck labs at noon - TSH  okay (~3) - Zofran for nausea, tylenol for pain control  Hypertensive emergency - Resolved, off of labetalol drip - On home medications - CT scan of the head showed no acute pathology  Elevated troponin - Troponin peaked at 4.69 this am, rechecking at noon - AM EKG with new TWI in lateral leads and I, II - Cardiology consulted - Etiology includes NSTEMI vs. Hypertensive emergency causing a troponin leak  Acute-on-chronic kidney injury - Renal function worsened today from baseline of 1.3-1.6 up to 2.36 today - Possibly related to dehydration vs. Rhabdomyolysis in the setting of being found down and elevated CK - IVF at 150cc/hr for 6 hours, recheck labs at noon and further IVF as needed  Found down, possible fall - Possible rhabdomyolysis as noted above - Xrays and CT scans done in ED showed no acute fractures, she denies pain anywhere.   Metabolic acidosis - Resolved this AM  Hypothyroidism - TSH WNL - Continue levothyroxine  Hypoxia - Improved this AM, still on [place] but maintaining saturations in the high 90s.      Consultants:  Cardiology  Procedures/Studies: Dg Pelvis 1-2 Views  03/27/2014   CLINICAL DATA:  Status post fall  EXAM: PELVIS - 1-2 VIEW  COMPARISON:  None.  FINDINGS: The bony pelvis is osteopenic. There is mild symmetric narrowing of the hip joints. The SI joints are intact. The observed portions of the sacrum are normal. There are  degenerative changes of the lumbar spine.  IMPRESSION: There is no acute pelvic fracture.   Electronically Signed   By: David  Swazil[place]Jordan   On: 03/27/2014 16:16   Ct Head Wo Contrast  03/27/2014   CLINICAL DATA:  Larey SeatFell.  Unresponsive.  EXAM: CT HEAD WITHOUT CONTRAST  CT CERVICAL SPINE WITHOUT CONTRAST  TECHNIQUE: Multidetector CT imaging of the head and cervical spine was performed following the standard protocol without intravenous contrast. Multiplanar CT image reconstructions of the cervical spine were also generated.  COMPARISON:  Head  CT 04/28/2012  FINDINGS: CT HEAD FINDINGS  Stable age related cerebral atrophy, ventriculomegaly and periventricular white matter disease. No extra-axial fluid collections are identified. No CT findings for acute hemispheric infarction or intracranial hemorrhage. No mass lesions. The brainstem and cerebellum are normal.  No acute bony findings. No fracture or bone lesion. The paranasal sinuses and mastoid air cells are clear. The globes are intact.  CT CERVICAL SPINE FINDINGS  Degenerative cervical spondylosis with multilevel disc disease and facet disease. The alignment is maintained. No acute fracture or abnormal prevertebral soft tissue swelling. The skullbase C1 and C1-2 articulations are maintained. The dens is intact. Multilevel foraminal stenosis due to uncinate spurring and facet disease.  Carotid artery calcifications are noted bilaterally. No neck mass or adenopathy. The lung apices are grossly clear.  IMPRESSION: 1. Stable age related cerebral atrophy, ventriculomegaly and periventricular white matter disease. 2. No acute intracranial findings or skull fracture. 3. Degenerative cervical spondylosis but normal alignment and no acute cervical spine fracture.   Electronically Signed   By: Loralie ChampagneMark  Gallerani M.D.   On: 03/27/2014 16:08   Ct Cervical Spine Wo Contrast  03/27/2014   CLINICAL DATA:  Larey SeatFell.  Unresponsive.  EXAM: CT HEAD WITHOUT CONTRAST  CT CERVICAL SPINE WITHOUT CONTRAST  TECHNIQUE: Multidetector CT imaging of the head and cervical spine was performed following the standard protocol without intravenous contrast. Multiplanar CT image reconstructions of the cervical spine were also generated.  COMPARISON:  Head CT 04/28/2012  FINDINGS: CT HEAD FINDINGS  Stable age related cerebral atrophy, ventriculomegaly and periventricular white matter disease. No extra-axial fluid collections are identified. No CT findings for acute hemispheric infarction or intracranial hemorrhage. No mass lesions. The  brainstem and cerebellum are normal.  No acute bony findings. No fracture or bone lesion. The paranasal sinuses and mastoid air cells are clear. The globes are intact.  CT CERVICAL SPINE FINDINGS  Degenerative cervical spondylosis with multilevel disc disease and facet disease. The alignment is maintained. No acute fracture or abnormal prevertebral soft tissue swelling. The skullbase C1 and C1-2 articulations are maintained. The dens is intact. Multilevel foraminal stenosis due to uncinate spurring and facet disease.  Carotid artery calcifications are noted bilaterally. No neck mass or adenopathy. The lung apices are grossly clear.  IMPRESSION: 1. Stable age related cerebral atrophy, ventriculomegaly and periventricular white matter disease. 2. No acute intracranial findings or skull fracture. 3. Degenerative cervical spondylosis but normal alignment and no acute cervical spine fracture.   Electronically Signed   By: Loralie ChampagneMark  Gallerani M.D.   On: 03/27/2014 16:08   Dg Chest Portable 1 View  03/27/2014   CLINICAL DATA:  Fall, cough/congestion  EXAM: PORTABLE CHEST - 1 VIEW  COMPARISON:  12/18/2013  FINDINGS: Chronic interstitial markings/ emphysematous changes. No focal consolidation. Mild elevation of the right hemidiaphragm. No pleural effusion or pneumothorax.  The heart is top-normal in size.  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  IMPRESSION: No  evidence of acute cardiopulmonary disease.   Electronically Signed   By: Charline BillsSriyesh  Krishnan M.D.   On: 03/27/2014 14:39     Antibiotics:  Vancomycin 7/4 -->   Cefepime 7/4 -->  Flagyl 7/4 -->  Code Status: DNR Family Communication: Pt at bedside, plan to call daughter today Disposition Plan: Will likely need ALF or SNF  HPI/Subjective: Patient with low blood pressure overnight, labetalol drip stopped and she has improved BP this morning.  She reports no pain, maybe a little nausea.   Objective: Filed Vitals:    03/28/14 0000 03/28/14 0300 03/28/14 0323 03/28/14 0808  BP: 102/28 118/45 118/45 134/41  Pulse:  61 61   Temp:  99 F (37.2 C) 99 F (37.2 C) 97.4 F (36.3 C)  TempSrc:  Oral Oral Oral  Resp: 23 18 21 20   Height:      Weight:  125 lb (56.7 kg)    SpO2: 100% 100% 97% 100%    Intake/Output Summary (Last 24 hours) at 03/28/14 0954 Last data filed at 03/28/14 0856  Gross per 24 hour  Intake  659.3 ml  Output    201 ml  Net  458.3 ml    Exam:   General:  Pt is alert to voice, follows commands appropriately, not in apparent distress, she is elderly and quickly falls back asleep after following commands.   Cardiovascular: Irreg Irreg, S1/S2, no murmurs noted  Respiratory: Clear to auscultation anteriorly, no wheezing  Abdomen: Soft, non tender,  bowel sounds present, no guarding  Extremities: No edema, pulses DP and PT palpable bilaterally  Neuro: Grossly nonfocal, she moves all extremities, has some difficulty complying with some of the motor exam.    Data Reviewed: Basic Metabolic Panel:  Recent Labs Lab 03/27/14 1501 03/27/14 1930 03/28/14 0655  NA 138  --  140  K 4.0  --  4.5  CL 98  --  102  CO2 19  --  24  GLUCOSE 188*  --  101*  BUN 27*  --  36*  CREATININE 1.61* 1.71* 2.36*  CALCIUM 9.3  --  8.6   Liver Function Tests:  Recent Labs Lab 03/27/14 1501 03/28/14 0655  AST 23 44*  ALT 14 14  ALKPHOS 117 73  BILITOT 0.4 0.5  PROT 8.2 6.6  ALBUMIN 3.7 2.8*   CBC:  Recent Labs Lab 03/27/14 1501 03/27/14 1930 03/28/14 0655  WBC 23.7* 32.0* 25.6*  NEUTROABS 21.2*  --   --   HGB 15.5* 14.2 12.9  HCT 46.9* 42.5 39.9  MCV 90.0 88.7 88.1  PLT 336 312 249   Cardiac Enzymes:  Recent Labs Lab 03/27/14 1501 03/27/14 1930 03/28/14 0040 03/28/14 0655  CKTOTAL 85 203* 382* 1124*  TROPONINI 0.38* 3.46* 3.56* 4.53*  4.69*     Recent Results (from the past 240 hour(s))  MRSA PCR SCREENING     Status: None   Collection Time    03/27/14  6:08  PM      Result Value Ref Range Status   MRSA by PCR NEGATIVE  NEGATIVE Final   Comment:            The GeneXpert MRSA Assay (FDA     approved for NASAL specimens     only), is one component of a     comprehensive MRSA colonization     surveillance program. It is not     intended to diagnose MRSA     infection nor to guide or  monitor treatment for     MRSA infections.     Scheduled Meds: . aspirin EC  81 mg Oral Daily  . ceFEPime (MAXIPIME) IV  2 g Intravenous Q24H  . heparin  5,000 Units Subcutaneous 3 times per day  . hydrALAZINE  25 mg Oral TID  . levothyroxine  50 mcg Oral QAC breakfast  . metronidazole  500 mg Intravenous Q8H  . montelukast  10 mg Oral QPM  . NIFEdipine  90 mg Oral QHS  . sertraline  25 mg Oral Daily  . sodium chloride  3 mL Intravenous Q12H  . [START ON 03/29/2014] vancomycin  750 mg Intravenous Q48H   Continuous Infusions: . sodium chloride 150 mL/hr at 03/28/14 0856     Debe Coder, MD  [facility] Pager [phone]  If 7PM-7AM, please contact night-coverage [email or web address] Password [email or web address] 03/28/2014, 9:54 AM   LOS: 1 day

## 2014-03-28 NOTE — Consult Note (Signed)
Primary cardiologist: Consulting cardiologist:  Clinical Summary Leslie [name] is a 80 y.o.female  Hx of CKD, HTN, HL, and renal mass thought to be cancerous per notes admitted after fall at home. Family reports over the last week or so the patient has been confused and more lethargic. She was found on the ground by a family member unconscious, she reportedly did not regain consciouness for several minutes until EMS arrived. She denied any chest pain. She was hypertensive in ER 190s/80s, tachypneic to 30s with O2 sats in 90s.    ER vitals p 86 bp 191/85 Sats 90% RR 30 CXR without acute findings,  Pro-BNP 14,898, K 4, initial Cr 1.61 (basline 1.4-1.5) but now up to 2.36, GFR 17, WBC 23, Hg 15.5, lactic acid elevated 4.47,  ABG 7.299/45.6/64 Trop 0.38-->3.46-->4.69-->4.53 EKG quite irregular, appears to be sinus with PACs, she also has evidence of Mobitz I second egree block adding to her irregularity. There are mild lateral precordial leads T-wave inversions.  Echo March 2015 LVEF 60%, no WMAs.    Allergies  Allergen Reactions  . Compazine     hives  . Penicillins     rash  . Sulfa Antibiotics     rash  . Iohexol Rash    Patient states last time she had "CT contrast" she got a rash.    Medications Scheduled Medications: . aspirin EC  81 mg Oral Daily  . ceFEPime (MAXIPIME) IV  2 g Intravenous Q24H  . heparin  5,000 Units Subcutaneous 3 times per day  . hydrALAZINE  25 mg Oral TID  . levothyroxine  50 mcg Oral QAC breakfast  . metronidazole  500 mg Intravenous Q8H  . montelukast  10 mg Oral QPM  . NIFEdipine  90 mg Oral QHS  . sertraline  25 mg Oral Daily  . sodium chloride  3 mL Intravenous Q12H  . [START ON 03/29/2014] vancomycin  750 mg Intravenous Q48H     Infusions: . sodium chloride 150 mL/hr at 03/28/14 0856     PRN Medications:  acetaminophen, acetaminophen, bisacodyl, diphenhydrAMINE, HYDROcodone-acetaminophen, labetalol, LORazepam, ondansetron (ZOFRAN) IV,  ondansetron, sodium chloride   Past Medical History  Diagnosis Date  . Hypertension   . Bowel obstruction 1987  . Hypothyroidism   . Interstitial cystitis   . Hypercholesteremia   . Headache(784.0)   . Anxiety   . Depression   . Multiple respiratory allergies   . Restless leg syndrome   . Stroke 06/2002    right parietal nonhemorrhagic acute infarction  . Abdominal mass   . Fibromyalgia   . Parkinson's disease     dx'd 1990's; told she doesn't have it ~ 2000  . Bradycardia, sinus   . CKD (chronic kidney disease): baseline cr 1.3-1.5 12/18/2013    Past Surgical History  Procedure Laterality Date  . Abdominal surgery  1970's    "for blockage"  . Cystoscopy with urethral dilatation  02/2006; 07/2005; 01/2005  . Cholecystectomy    . Tonsillectomy    . Appendectomy    . Abdominal hysterectomy    . Dilation and curettage of uterus  1940's  . Shoulder surgery  1970's    "for fx"    No family history on file.  Social History Leslie [name] reports that she has never smoked. She has never used smokeless tobacco. Leslie [name] reports that she does not drink alcohol.  Review of Systems CONSTITUTIONAL: No weight loss, fever, chills, weakness or fatigue.  HEENT: Eyes: No visual loss,  blurred vision, double vision or yellow sclerae. No hearing loss, sneezing, congestion, runny nose or sore throat.  SKIN: No rash or itching.  CARDIOVASCULAR: per HPI  RESPIRATORY: per HPI.  GASTROINTESTINAL: per HPI GENITOURINARY: no polyuria, no dysuria NEUROLOGICAL: No headache, dizziness, syncope, paralysis, ataxia, numbness or tingling in the extremities. No change in bowel or bladder control.  MUSCULOSKELETAL: No muscle, back pain, joint pain or stiffness.  HEMATOLOGIC: No anemia, bleeding or bruising.  LYMPHATICS: No enlarged nodes. No history of splenectomy.  PSYCHIATRIC: No history of depression or anxiety.      Physical Examination Blood pressure 128/106, pulse 76, temperature 98.3 F  (36.8 C), temperature source Oral, resp. rate 23, height 5' (1.524 m), weight 125 lb (56.7 kg), SpO2 96.00%.  Intake/Output Summary (Last 24 hours) at 03/28/14 1206 Last data filed at 03/28/14 1000  Gross per 24 hour  Intake  819.3 ml  Output    201 ml  Net  618.3 ml    HEENT: sclera clear  Cardiovascular: RRR, no m/r/g, no JVD  Respiratory: clear anteriorally  GI: abdomen soft, NT, ND  MSK: no LE edema  Neuro: no focal deficits  Psych: appropriate affect   Lab Results  Basic Metabolic Panel:  Recent Labs Lab 03/27/14 1501 03/27/14 1930 03/28/14 0655  NA 138  --  140  K 4.0  --  4.5  CL 98  --  102  CO2 19  --  24  GLUCOSE 188*  --  101*  BUN 27*  --  36*  CREATININE 1.61* 1.71* 2.36*  CALCIUM 9.3  --  8.6    Liver Function Tests:  Recent Labs Lab 03/27/14 1501 03/28/14 0655  AST 23 44*  ALT 14 14  ALKPHOS 117 73  BILITOT 0.4 0.5  PROT 8.2 6.6  ALBUMIN 3.7 2.8*    CBC:  Recent Labs Lab 03/27/14 1501 03/27/14 1930 03/28/14 0655  WBC 23.7* 32.0* 25.6*  NEUTROABS 21.2*  --   --   HGB 15.5* 14.2 12.9  HCT 46.9* 42.5 39.9  MCV 90.0 88.7 88.1  PLT 336 312 249    Cardiac Enzymes:  Recent Labs Lab 03/27/14 1501 03/27/14 1930 03/28/14 0040 03/28/14 0655  CKTOTAL 85 203* 382* 1124*  TROPONINI 0.38* 3.46* 3.56* 4.53*  4.69*    BNP: No components found with this basename: POCBNP,    Imaging 11/2013 Echo Study Conclusions  - Left ventricle: The cavity size was normal. Wall thickness was increased in a pattern of mild LVH. The estimated ejection fraction was 60%. Wall motion was normal; there were no regional wall motion abnormalities. - Left atrium: The atrium was mildly dilated. - Right ventricle: The cavity size was normal. Systolic function was mildly reduced.    Impression/Recommendations  1. NSTEMI - likely demand ischemia in the stting of severe HTN, lactic acidosis, possible infection with low grade temp and severely  elevated WBC, hypoxia. She potentially may have some chronic CAD that in the setting of such acute systemic and metabolic stresses caused ischemia. This is more likely as opposed to acute occlusive CAD. She has had no chest pain this admisssion or in the months prior.  - given her age, multiple medical comorbidities including a reported history of a renal mass concerning for cancer, and her severe renal dysfunction cath would be very high risk. - recommend medical management with correction of underlying acute systemic processes, will start anticoagulation. Will first check ptt and INR in setting of sepsis like picture to make  sure she is not coagulatiopathic.  - continue ASA, will start statin. Avoid beta blocker at this time as she does have low baseline heart rate with mobitz I block. - please repeat CXR, surprising such an elevated BNP without significant pulm edema - will obtain echo   2. Leukocytosis - workup per primary team, with diarrhea and such maked elevation in WBC cdiff is a concern given she was very recently on abx at home. Workup per primary team    Leslie RichJonathan Branch, M.D., F.A.C.C.

## 2014-03-28 NOTE — Progress Notes (Signed)
  Echocardiogram 2D Echocardiogram has been performed.  Dorothey BasemanReel, Cerria Randhawa M 03/28/2014, 2:22 PM

## 2014-03-28 NOTE — Progress Notes (Signed)
ANTICOAGULATION CONSULT NOTE - Initial Consult  Pharmacy Consult for heparin Indication: chest pain/ACS  Allergies  Allergen Reactions  . Compazine     hives  . Penicillins     rash  . Sulfa Antibiotics     rash  . Iohexol Rash    Patient states last time she had "CT contrast" she got a rash.    Patient Measurements: Height: 5' (152.4 cm) Weight: 125 lb (56.7 kg) IBW/kg (Calculated) : 45.5 Heparin Dosing Weight:   Vital Signs: Temp: 98.6 F (37 C) (07/05 1906) Temp src: Oral (07/05 1906) BP: 117/53 mmHg (07/05 1906) Pulse Rate: 83 (07/05 1906)  Labs:  Recent Labs  03/27/14 1501 03/27/14 1930 03/28/14 0040 03/28/14 0655 03/28/14 1142  HGB 15.5* 14.2  --  12.9  --   HCT 46.9* 42.5  --  39.9  --   PLT 336 312  --  249  --   CREATININE 1.61* 1.71*  --  2.36* 2.27*  CKTOTAL 85 203* 382* 1124* 1122*  TROPONINI 0.38* 3.46* 3.56* 4.53*  4.69* 3.75*    Estimated Creatinine Clearance: 13 ml/min (by C-G formula based on Cr of 2.27).   Medical History: Past Medical History  Diagnosis Date  . Hypertension   . Bowel obstruction 1987  . Hypothyroidism   . Interstitial cystitis   . Hypercholesteremia   . Headache(784.0)   . Anxiety   . Depression   . Multiple respiratory allergies   . Restless leg syndrome   . Stroke 06/2002    right parietal nonhemorrhagic acute infarction  . Abdominal mass   . Fibromyalgia   . Parkinson's disease     dx'd 1990's; told she doesn't have it ~ 2000  . Bradycardia, sinus   . CKD (chronic kidney disease): baseline cr 1.3-1.5 12/18/2013    Medications:  Scheduled:  . aspirin EC  81 mg Oral Daily  . atorvastatin  40 mg Oral q1800  . ceFEPime (MAXIPIME) IV  2 g Intravenous Q24H  . heparin  5,000 Units Subcutaneous 3 times per day  . hydrALAZINE  25 mg Oral TID  . levothyroxine  50 mcg Oral QAC breakfast  . metronidazole  500 mg Intravenous Q8H  . montelukast  10 mg Oral QPM  . NIFEdipine  90 mg Oral QHS  . sertraline  25  mg Oral Daily  . sodium chloride  3 mL Intravenous Q12H  . [START ON 03/29/2014] vancomycin  750 mg Intravenous Q48H    Assessment: 80 yr old female was found down at home and was confused and unable to care for herself. She was brought to the ED and admitted, antibiotics started and she was monitored overnight. Some abnormalities started to show up on the EKG. Pharmacy was asked to dose heparin for ACS. Pt had recently received 5000 units heparin SQ so we did not give a bolus.   Goal of Therapy:  Heparin level 0.3-0.7 units/ml Monitor platelets by anticoagulation protocol: Yes   Plan:  Heparin drip at 800 units/hr.  Daily AM hepairn level and CBC.  Eugene Garnetotter, Salman Wellen Sue 03/28/2014,8:14 PM

## 2014-03-28 NOTE — Progress Notes (Signed)
Md notified of lab for Cdif  = neg.  Vs normal.  Per md will leave on contact tonight.  Rounding Md will make descion in am.  Will continue to monitor pt . Karena Addisonoro, Janell Keeling T

## 2014-03-29 ENCOUNTER — Inpatient Hospital Stay (HOSPITAL_COMMUNITY): Payer: Medicare Other

## 2014-03-29 LAB — PROTIME-INR
INR: 1.23 (ref 0.00–1.49)
PROTHROMBIN TIME: 15.5 s — AB (ref 11.6–15.2)

## 2014-03-29 LAB — APTT: aPTT: 52 seconds — ABNORMAL HIGH (ref 24–37)

## 2014-03-29 LAB — HEPARIN LEVEL (UNFRACTIONATED)
HEPARIN UNFRACTIONATED: 0.44 [IU]/mL (ref 0.30–0.70)
Heparin Unfractionated: 0.62 IU/mL (ref 0.30–0.70)

## 2014-03-29 MED ORDER — ENSURE COMPLETE PO LIQD
237.0000 mL | Freq: Two times a day (BID) | ORAL | Status: DC
  Administered 2014-03-30 – 2014-03-31 (×2): 237 mL via ORAL

## 2014-03-29 MED ORDER — HEPARIN BOLUS VIA INFUSION
2000.0000 [IU] | Freq: Once | INTRAVENOUS | Status: AC
  Administered 2014-03-29: 2000 [IU] via INTRAVENOUS
  Filled 2014-03-29: qty 2000

## 2014-03-29 MED ORDER — SODIUM CHLORIDE 0.9 % IV SOLN: INTRAVENOUS | Status: DC

## 2014-03-29 NOTE — Progress Notes (Signed)
[facility]  Leslie [name] ZOX:[id number] DOB: 11/23/1921 DOA: 03/27/2014 PCP: Ginette Otto, MD  Time spent : 35 mins  Brief narrative: 80 yo woman with PMH of a renal mass (likely cancer per family), HTN, CKD, hypothyroidism, recent UTI, HLD, PD and bradycardia. Presented after a presumed fall. Noted to be more confused and unable to care for herself for about a week per report of family. She had been having diarrhea (which was not new for her) but she had also recently completed a course of antibiotics for a UTI.   In the ED, she was noted to have elevated BP, low grade temperature, tachycardia and an elevated WBC. She was also hypoxic. CXR did not show a source for infection, UA was clean, BC X 2 were sent and Cdiff as well. She had a mildly elevated TnI in the ED to 0.38 without any acute EKG changes.   24 hours after admission she developed elevated TNI with peak at 4.69 and new TWI on EKG in the lateral leads, I and II. She had been started on broad spectrum Abx and flagyl. AM Labs on 03/28/14 revealed a new ARF with Cr of 2.36.   HPI/Subjective: Endorses generalized malaise and dyspnea.   Assessment/Plan:  SIRS/persistent Leukocytosis -WBCs remain in 20's without clear cut source -repeat CXR 7/7 post hydration -cont empiric anbx's -C Diff negative -UA unremarkable   Hypertensive emergency  - Resolved, off of labetalol drip  - cont home medications  - CT scan of the head showed no acute pathology   Elevated troponin /NSTEMI - Troponin peaked at 4.69  - AM EKG with new TWI in lateral leads and I, II  - Cardiology consulted and suspects LAD lesion but given renal function and advanced age (as well as pt preference to not pursue cath) plan is to focus on medical rxn-also noted with new apical/distal wall akinesia on echo this admission -Cardiology documents no beta blocker since patient has intermittent Mobitz 1 on  telemetry  Acute-on-chronic kidney injury / Rhabdomyolosis - baseline renal function: 17/1.61 March 2015 and presented with Scr 1,71- after admit peaked to 2.36 with BUN 36 and with hydration slowly trending down but not yet at baseline -CPK also mildly elevated at admit at 203 but has peaked at 1124 with slow trend down -IVFs were time limited but with poor intake.anorexia will resume at 100/hr (has underlying grade 2 DD)  Metabolic acidosis  - Resolved and likely 2/2 ARF and inadequate peripheral perfusion and possibly from pre-admission diarrhea  Acute on chronic diarrhea -C. difficile toxin PCR negative so discontinue contact isolation and Flagyl  Hypothyroidism  - TSH WNL  - Continue levothyroxine   Acute respiratory failure with Hypoxia  - still requiring [place] oxygen -CXR in am to ensure no PNA visible post hydration  DVT prophylaxis: IV heparin Code Status: Full Family Communication: No family at bedside Disposition Plan/Expected LOS: Step down  Consultants: Cardiology  Procedures: 2-D echocardiogram - Left ventricle: The cavity size was normal. Wall thickness was increased in a pattern of mild LVH. Systolic function was normal. The estimated ejection fraction was in the range of 50% to 55%. There is akinesis of the apical/ distal myocardium. Features are consistent with a pseudonormal left ventricular filling pattern, with concomitant abnormal relaxation and increased filling pressure (grade 2 diastolic dysfunction). - Mitral valve: There was mild regurgitation. - Left atrium: The atrium was mildly dilated. Impressions: - When compared to  prior echocardiogram 12/19/13, apical/ distal wall akinesis is new.  Antibiotics: Cefepime 7/4 >>> Flagyl 7/4 >>> 7/6 Vancomycin 7/4 >>>  Objective: Blood pressure 145/76, pulse 80, temperature 97.9 F (36.6 C), temperature source Oral, resp. rate 21, height 5' (1.524 m), weight 125 lb (56.7 kg), SpO2 97.00%.  Intake/Output  Summary (Last 24 hours) at 03/29/14 1253 Last data filed at 03/29/14 1000  Gross per 24 hour  Intake 1849.41 ml  Output    251 ml  Net 1598.41 ml   Exam: General: No acute respiratory distress Lungs: Clear to auscultation bilaterally without wheezes or crackles Cardiovascular: Regular rate and rhythm without murmur gallop or rub normal S1 and S2  Abdomen: Nontender, nondistended, soft, bowel sounds positive, no rebound, no ascites, no appreciable mass Musculoskeletal: No significant cyanosis, clubbing of bilateral lower extremities   Scheduled Meds:  Scheduled Meds: . aspirin EC  81 mg Oral Daily  . atorvastatin  40 mg Oral q1800  . ceFEPime (MAXIPIME) IV  2 g Intravenous Q24H  . hydrALAZINE  25 mg Oral TID  . levothyroxine  50 mcg Oral QAC breakfast  . montelukast  10 mg Oral QPM  . NIFEdipine  90 mg Oral QHS  . sertraline  25 mg Oral Daily  . sodium chloride  3 mL Intravenous Q12H  . vancomycin  750 mg Intravenous Q48H   Continuous Infusions: . heparin 850 Units/hr (03/29/14 0800)    Data Reviewed: Basic Metabolic Panel:  Recent Labs Lab 03/27/14 1501 03/27/14 1930 03/28/14 0655 03/28/14 1142  NA 138  --  140 141  K 4.0  --  4.5 4.4  CL 98  --  102 103  CO2 19  --  24 23  GLUCOSE 188*  --  101* 98  BUN 27*  --  36* 37*  CREATININE 1.61* 1.71* 2.36* 2.27*  CALCIUM 9.3  --  8.6 8.5   Liver Function Tests:  Recent Labs Lab 03/27/14 1501 03/28/14 0655 03/28/14 1142  AST 23 44* 48*  ALT 14 14 15   ALKPHOS 117 73 78  BILITOT 0.4 0.5 0.6  PROT 8.2 6.6 7.2  ALBUMIN 3.7 2.8* 3.1*   CBC:  Recent Labs Lab 03/27/14 1501 03/27/14 1930 03/28/14 0655 03/28/14 2115  WBC 23.7* 32.0* 25.6* 22.1*  NEUTROABS 21.2*  --   --   --   HGB 15.5* 14.2 12.9 12.3  HCT 46.9* 42.5 39.9 38.0  MCV 90.0 88.7 88.1 88.4  PLT 336 312 249 284   Cardiac Enzymes:  Recent Labs Lab 03/27/14 1501 03/27/14 1930 03/28/14 0040 03/28/14 0655 03/28/14 1142  CKTOTAL 85 203*  382* 1124* 1122*  TROPONINI 0.38* 3.46* 3.56* 4.53*  4.69* 3.75*   BNP (last 3 results)  Recent Labs  12/18/13 1846 03/27/14 1501  PROBNP 2335.0* 14898.0*    Recent Results (from the past 240 hour(s))  CULTURE, BLOOD (ROUTINE X 2)     Status: None   Collection Time    03/27/14  4:45 PM      Result Value Ref Range Status   Specimen Description BLOOD LEFT ANTECUBITAL   Final   Special Requests BOTTLES DRAWN AEROBIC AND ANAEROBIC 6CC EA   Final   Culture  Setup Time     Final   Value: 03/28/2014 02:10     Performed at [facility]   Culture     Final   Value:        BLOOD CULTURE RECEIVED NO GROWTH TO DATE CULTURE WILL BE HELD  FOR 5 DAYS BEFORE ISSUING A FINAL NEGATIVE REPORT     Performed at [facility]   Report Status PENDING   Incomplete  CULTURE, BLOOD (ROUTINE X 2)     Status: None   Collection Time    03/27/14  5:24 PM      Result Value Ref Range Status   Specimen Description BLOOD RIGHT HAND   Final   Special Requests BOTTLES DRAWN AEROBIC ONLY 5CC   Final   Culture  Setup Time     Final   Value: 03/28/2014 02:10     Performed at [facility]   Culture     Final   Value:        BLOOD CULTURE RECEIVED NO GROWTH TO DATE CULTURE WILL BE HELD FOR 5 DAYS BEFORE ISSUING A FINAL NEGATIVE REPORT     Performed at [facility]   Report Status PENDING   Incomplete  MRSA PCR SCREENING     Status: None   Collection Time    03/27/14  6:08 PM      Result Value Ref Range Status   MRSA by PCR NEGATIVE  NEGATIVE Final   Comment:            The GeneXpert MRSA Assay (FDA     approved for NASAL specimens     only), is one component of a     comprehensive MRSA colonization     surveillance program. It is not     intended to diagnose MRSA     infection nor to guide or     monitor treatment for     MRSA infections.  CLOSTRIDIUM DIFFICILE BY PCR     Status: None   Collection Time    03/28/14  5:17 PM      Result Value Ref Range Status   C  difficile by pcr NEGATIVE  NEGATIVE Final     Studies:  Recent x-ray studies have been reviewed in detail by the Attending Physician      Junious SilkAllison Ellis, ANP [facility] Office  [phone] Pager [phone]  **If unable to reach the above provider after paging please contact the Flow Manager @ [phone]  On-Call/Text Page:      [email or web address]      password [email or web address]  If 7PM-7AM, please contact night-coverage [email or web address] Password [email or web address] 03/29/2014, 12:53 PM   LOS: 2 days   I have personally examined this patient and reviewed the entire database. I have reviewed the above note, made any necessary editorial changes, and agree with its content.  Lonia BloodJeffrey T. Colleen Donahoe, MD [facility]

## 2014-03-29 NOTE — Progress Notes (Signed)
ANTICOAGULATION CONSULT NOTE   Pharmacy Consult for heparin  Indication: chest pain/ACS  Allergies  Allergen Reactions  . Compazine     hives  . Penicillins     rash  . Sulfa Antibiotics     rash  . Iohexol Rash    Patient states last time she had "CT contrast" she got a rash.    Patient Measurements: Height: 5' (152.4 cm) Weight: 125 lb (56.7 kg) IBW/kg (Calculated) : 45.5 Heparin Dosing Weight: 56.7 kg    Vital Signs: Temp: 99.3 F (37.4 C) (07/06 1958) Temp src: Oral (07/06 1958) BP: 176/80 mmHg (07/06 1958)  Labs:  Recent Labs  03/27/14 1930 03/28/14 0040 03/28/14 0655 03/28/14 1142 03/28/14 2115 03/29/14 0330 03/29/14 1300 03/29/14 2033  HGB 14.2  --  12.9  --  12.3  --   --   --   HCT 42.5  --  39.9  --  38.0  --   --   --   PLT 312  --  249  --  284  --   --   --   APTT  --   --   --   --   --  52*  --   --   LABPROT  --   --   --   --   --  15.5*  --   --   INR  --   --   --   --   --  1.23  --   --   HEPARINUNFRC  --   --   --   --   --  <0.10* 0.62 0.44  CREATININE 1.71*  --  2.36* 2.27*  --   --   --   --   CKTOTAL 203* 382* 1124* 1122*  --   --   --   --   TROPONINI 3.46* 3.56* 4.53*  4.69* 3.75*  --   --   --   --     Estimated Creatinine Clearance: 13 ml/min (by C-G formula based on Cr of 2.27).   Medications:  Heparin infusion at 850/hr    Assessment: Patient placed on heparin for nstemi, no chest pain noted. Heparin level is now at goal on 850 units/hr. No bleeding issues noted. Plan to continue heparin for 48 hours as part of medical management, started 7/5.  CBC remains stable.    Goal of Therapy:  Heparin level 0.3-0.7 units/ml Monitor platelets by anticoagulation protocol: Yes    Plan:  Continue heparin at 850 units/hr  Daily HL/CBC Monitor for s/sx bleeding    Agapito GamesAlison Paw Karstens, PharmD, BCPS Clinical Pharmacist Pager: [phone] 03/29/2014 9:11 PM

## 2014-03-29 NOTE — Progress Notes (Signed)
ANTICOAGULATION CONSULT NOTE - Follow Up Consult  Pharmacy Consult for heparin  Indication: chest pain/ACS  Allergies  Allergen Reactions  . Compazine     hives  . Penicillins     rash  . Sulfa Antibiotics     rash  . Iohexol Rash    Patient states last time she had "CT contrast" she got a rash.    Patient Measurements: Height: 5' (152.4 cm) Weight: 125 lb (56.7 kg) IBW/kg (Calculated) : 45.5 Heparin Dosing Weight:    Vital Signs: Temp: 97.3 F (36.3 C) (07/06 0308) Temp src: Oral (07/06 0308) BP: 154/60 mmHg (07/06 0308) Pulse Rate: 80 (07/06 0308)  Labs:  Recent Labs  03/27/14 1930 03/28/14 0040 03/28/14 0655 03/28/14 1142 03/28/14 2115 03/29/14 0330  HGB 14.2  --  12.9  --  12.3  --   HCT 42.5  --  39.9  --  38.0  --   PLT 312  --  249  --  284  --   APTT  --   --   --   --   --  52*  LABPROT  --   --   --   --   --  15.5*  INR  --   --   --   --   --  1.23  HEPARINUNFRC  --   --   --   --   --  <0.10*  CREATININE 1.71*  --  2.36* 2.27*  --   --   CKTOTAL 203* 382* 1124* 1122*  --   --   TROPONINI 3.46* 3.56* 4.53*  4.69* 3.75*  --   --     Estimated Creatinine Clearance: 13 ml/min (by C-G formula based on Cr of 2.27).   Medications:  Heparin infusion at 800/hr with no bolus. Previously 5000 units sq hep.   Assessment: Heparin level undetectable. Infusion site changed but heparin not interrupted for any appreciable time. Was some redness and mild swelling.  Goal of Therapy:  Heparin level 0.3-0.7 units/ml Monitor platelets by anticoagulation protocol: Yes   Plan:  Give 2000 unit bolus and increase to 850 units/hr with 8 hour HL for f/u  [name], [name] [name] 03/29/2014,4:41 AM

## 2014-03-29 NOTE — Progress Notes (Signed)
    Subjective:  Denies CP or dyspnea   Objective:  Filed Vitals:   03/28/14 2101 03/28/14 2317 03/29/14 0308 03/29/14 0808  BP: 151/51 149/46 154/60 155/77  Pulse:  67 80   Temp:  97.7 F (36.5 C) 97.3 F (36.3 C) 97.7 F (36.5 C)  TempSrc:  Oral Oral Oral  Resp:  22 18 34  Height:      Weight:      SpO2:  99% 95% 97%    Intake/Output from previous day:  Intake/Output Summary (Last 24 hours) at 03/29/14 0847 Last data filed at 03/29/14 0800  Gross per 24 hour  Intake 2540.33 ml  Output    251 ml  Net 2289.33 ml    Physical Exam: Physical exam: Well-developed frail in no acute distress.  Skin is warm and dry.  HEENT is normal.  Neck is supple.  Chest with diminished BS bases Cardiovascular exam is regular rate and rhythm.  Abdominal exam nontender or distended. No masses palpated. Extremities show no edema. neuro grossly intact    Lab Results: Basic Metabolic Panel:  Recent Labs  16/06/9606/05/15 0655 03/28/14 1142  NA 140 141  K 4.5 4.4  CL 102 103  CO2 24 23  GLUCOSE 101* 98  BUN 36* 37*  CREATININE 2.36* 2.27*  CALCIUM 8.6 8.5   CBC:  Recent Labs  03/27/14 1501  03/28/14 0655 03/28/14 2115  WBC 23.7*  < > 25.6* 22.1*  NEUTROABS 21.2*  --   --   --   HGB 15.5*  < > 12.9 12.3  HCT 46.9*  < > 39.9 38.0  MCV 90.0  < > 88.1 88.4  PLT 336  < > 249 284  < > = values in this interval not displayed. Cardiac Enzymes:  Recent Labs  03/28/14 0040 03/28/14 0655 03/28/14 1142  CKTOTAL 382* 1124* 1122*  TROPONINI 3.56* 4.53*  4.69* 3.75*   Telemetry showed sinus rhythm with PACs and occasional Mobitz 1.  Assessment/Plan:  1 non-ST elevation myocardial infarction-the patient has had no chest pain. Event occurred in the setting of probable infection and may be stress-induced. Electrocardiogram shows anterior T-wave inversion suggestive of LAD lesion. Continue aspirin and statin. Continue heparin for 48 hours. No beta blocker as she has intermittent  Mobitz 1 on telemetry. She is very frail with multiple medical problems and would be a poor candidate for cardiac catheterization. I also discussed this with her and she does not want procedures. Therefore plan medical therapy. Note echocardiogram shows ejection fraction 50-55%. 2 hypertension-continue present medications and adjust as needed. 3 acute on chronic renal failure-follow renal function. 4 possible infection-management per primary care. 5 renal mass  Leslie MillersBrian Crenshaw 03/29/2014, 8:47 AM

## 2014-03-29 NOTE — Progress Notes (Signed)
INITIAL NUTRITION ASSESSMENT  DOCUMENTATION CODES Per approved criteria  -Not Applicable   INTERVENTION:  Ensure Complete PO BID, each supplement provides 350 kcal and 13 grams of protein  NUTRITION DIAGNOSIS: Inadequate oral intake related to poor appetite as evidenced by poor intake of meals.   Goal: Intake to meet >90% of estimated nutrition needs.  Monitor:  PO intake, labs, weight trend.  Reason for Assessment: MST  80 y.o. female  Admitting Dx: SIRS (systemic inflammatory response syndrome)  ASSESSMENT: 80 yo woman with PMH of Renal mass, CKD, HTN, Hypothyroidism, recent UTI, HLD, PD, bradycardia who presented to the hospital after a fall.   Weight stable for the past 3 months. Per patient report of usual weight, she has lost ~ 9% over the past few months. She says that she has been eating poorly since admission. She usually eats well. Patient seemed confused during RD visit. Question accuracy of nutrition history.  Nutrition Focused Physical Exam:  Subcutaneous Fat:  Orbital Region: WNL Upper Arm Region: moderate depletion Thoracic and Lumbar Region: NA  Muscle:  Temple Region: moderate depletion Clavicle Bone Region: moderate depletion Clavicle and Acromion Bone Region: mild depletion Scapular Bone Region: NA Dorsal Hand: WNL Patellar Region: mild depletion Anterior Thigh Region: mild depletion Posterior Calf Region: mild depletion  Edema: none   Height: Ht Readings from Last 1 Encounters:  03/27/14 5' (1.524 m)    Weight: Wt Readings from Last 1 Encounters:  03/28/14 125 lb (56.7 kg)    Ideal Body Weight: 45.5 kg  % Ideal Body Weight: 125%  Wt Readings from Last 10 Encounters:  03/28/14 125 lb (56.7 kg)  12/22/13 126 lb 8 oz (57.38 kg)  10/02/11 144 lb 9.6 oz (65.59 kg)    Usual Body Weight: 138 lb per patient   % Usual Body Weight: 91%  BMI:  Body mass index is 24.41 kg/(m^2).  Estimated Nutritional Needs: Kcal:  1250-1450 Protein: 75-85 gm Fluid: 1.5 L  Skin: intact  Diet Order: Parke SimmersBland  EDUCATION NEEDS: -Education not appropriate at this time   Intake/Output Summary (Last 24 hours) at 03/29/14 1531 Last data filed at 03/29/14 1500  Gross per 24 hour  Intake 1891.91 ml  Output    252 ml  Net 1639.91 ml    Last BM: 7/5   Labs:   Recent Labs Lab 03/27/14 1501 03/27/14 1930 03/28/14 0655 03/28/14 1142  NA 138  --  140 141  K 4.0  --  4.5 4.4  CL 98  --  102 103  CO2 19  --  24 23  BUN 27*  --  36* 37*  CREATININE 1.61* 1.71* 2.36* 2.27*  CALCIUM 9.3  --  8.6 8.5  GLUCOSE 188*  --  101* 98    CBG (last 3)  No results found for this basename: GLUCAP,  in the last 72 hours  Scheduled Meds: . aspirin EC  81 mg Oral Daily  . atorvastatin  40 mg Oral q1800  . ceFEPime (MAXIPIME) IV  2 g Intravenous Q24H  . hydrALAZINE  25 mg Oral TID  . levothyroxine  50 mcg Oral QAC breakfast  . montelukast  10 mg Oral QPM  . NIFEdipine  90 mg Oral QHS  . sertraline  25 mg Oral Daily  . sodium chloride  3 mL Intravenous Q12H  . vancomycin  750 mg Intravenous Q48H    Continuous Infusions: . sodium chloride 100 mL/hr at 03/29/14 1300  . heparin 850 Units/hr (03/29/14 0800)  Past Medical History  Diagnosis Date  . Hypertension   . Bowel obstruction 1987  . Hypothyroidism   . Interstitial cystitis   . Hypercholesteremia   . Headache(784.0)   . Anxiety   . Depression   . Multiple respiratory allergies   . Restless leg syndrome   . Stroke 06/2002    right parietal nonhemorrhagic acute infarction  . Abdominal mass   . Fibromyalgia   . Parkinson's disease     dx'd 1990's; told she doesn't have it ~ 2000  . Bradycardia, sinus   . CKD (chronic kidney disease): baseline cr 1.3-1.5 12/18/2013    Past Surgical History  Procedure Laterality Date  . Abdominal surgery  1970's    "for blockage"  . Cystoscopy with urethral dilatation  02/2006; 07/2005; 01/2005  . Cholecystectomy     . Tonsillectomy    . Appendectomy    . Abdominal hysterectomy    . Dilation and curettage of uterus  1940's  . Shoulder surgery  1970's    "for fx"    Joaquin CourtsKimberly Harris, RD, LDN, CNSC Pager [phone] After Hours Pager [phone]

## 2014-03-29 NOTE — Progress Notes (Signed)
ANTICOAGULATION CONSULT NOTE   Pharmacy Consult for heparin  Indication: chest pain/ACS  Allergies  Allergen Reactions  . Compazine     hives  . Penicillins     rash  . Sulfa Antibiotics     rash  . Iohexol Rash    Patient states last time she had "CT contrast" she got a rash.    Patient Measurements: Height: 5' (152.4 cm) Weight: 125 lb (56.7 kg) IBW/kg (Calculated) : 45.5 Heparin Dosing Weight:    Vital Signs: Temp: 97.9 F (36.6 C) (07/06 1211) Temp src: Oral (07/06 1211) BP: 145/76 mmHg (07/06 1211) Pulse Rate: 80 (07/06 0308)  Labs:  Recent Labs  03/27/14 1930 03/28/14 0040 03/28/14 0655 03/28/14 1142 03/28/14 2115 03/29/14 0330 03/29/14 1300  HGB 14.2  --  12.9  --  12.3  --   --   HCT 42.5  --  39.9  --  38.0  --   --   PLT 312  --  249  --  284  --   --   APTT  --   --   --   --   --  52*  --   LABPROT  --   --   --   --   --  15.5*  --   INR  --   --   --   --   --  1.23  --   HEPARINUNFRC  --   --   --   --   --  <0.10* 0.62  CREATININE 1.71*  --  2.36* 2.27*  --   --   --   CKTOTAL 203* 382* 1124* 1122*  --   --   --   TROPONINI 3.46* 3.56* 4.53*  4.69* 3.75*  --   --   --     Estimated Creatinine Clearance: 13 ml/min (by C-G formula based on Cr of 2.27).   Medications:  Heparin infusion at 850/hr   Assessment: Patient placed on heparin for nstemi, no cp noted. Heparin level this afternoon is now at goal on 850 units/hr. No bleeding issues noted, new IV site as of this morning. Plan to continue heparin for 48 hours as part of medical management, started 7/5. Will check confirmatory level tonight. CBC has been trending down, not drawn this am.  Goal of Therapy:  Heparin level 0.3-0.7 units/ml Monitor platelets by anticoagulation protocol: Yes   Plan:  Continue heparin at 850 units/hr  Check confirmatory level tonight Daily HL/CBC  Sheppard CoilFrank Wilson PharmD., BCPS Clinical Pharmacist Pager [phone] 03/29/2014 2:02 PM

## 2014-03-30 DIAGNOSIS — J9600 Acute respiratory failure, unspecified whether with hypoxia or hypercapnia: Secondary | ICD-10-CM

## 2014-03-30 DIAGNOSIS — R197 Diarrhea, unspecified: Secondary | ICD-10-CM

## 2014-03-30 DIAGNOSIS — M6282 Rhabdomyolysis: Secondary | ICD-10-CM

## 2014-03-30 DIAGNOSIS — D72829 Elevated white blood cell count, unspecified: Secondary | ICD-10-CM

## 2014-03-30 DIAGNOSIS — I214 Non-ST elevation (NSTEMI) myocardial infarction: Secondary | ICD-10-CM

## 2014-03-30 LAB — CBC
HCT: 37.3 % (ref 36.0–46.0)
Hemoglobin: 12 g/dL (ref 12.0–15.0)
MCH: 28.5 pg (ref 26.0–34.0)
MCHC: 32.2 g/dL (ref 30.0–36.0)
MCV: 88.6 fL (ref 78.0–100.0)
PLATELETS: 338 10*3/uL (ref 150–400)
RBC: 4.21 MIL/uL (ref 3.87–5.11)
RDW: 16 % — AB (ref 11.5–15.5)
WBC: 22 10*3/uL — ABNORMAL HIGH (ref 4.0–10.5)

## 2014-03-30 LAB — BASIC METABOLIC PANEL
ANION GAP: 17 — AB (ref 5–15)
ANION GAP: 19 — AB (ref 5–15)
BUN: 31 mg/dL — AB (ref 6–23)
BUN: 32 mg/dL — AB (ref 6–23)
CALCIUM: 8.3 mg/dL — AB (ref 8.4–10.5)
CALCIUM: 8.4 mg/dL (ref 8.4–10.5)
CO2: 12 mEq/L — ABNORMAL LOW (ref 19–32)
CO2: 17 mEq/L — ABNORMAL LOW (ref 19–32)
CREATININE: 1.84 mg/dL — AB (ref 0.50–1.10)
CREATININE: 1.91 mg/dL — AB (ref 0.50–1.10)
Chloride: 106 mEq/L (ref 96–112)
Chloride: 105 mEq/L (ref 96–112)
GFR calc non Af Amer: 23 mL/min — ABNORMAL LOW (ref >90)
GFR, EST AFRICAN AMERICAN: 26 mL/min — AB (ref >90)
GFR, EST AFRICAN AMERICAN: 27 mL/min — AB (ref >90)
GFR, EST NON AFRICAN AMERICAN: 22 mL/min — AB (ref >90)
Glucose, Bld: 106 mg/dL — ABNORMAL HIGH (ref 70–99)
Glucose, Bld: 121 mg/dL — ABNORMAL HIGH (ref 70–99)
Potassium: 7.4 mEq/L (ref 3.7–5.3)
Potassium: 4.1 mEq/L (ref 3.7–5.3)
Sodium: 137 mEq/L (ref 137–147)
Sodium: 139 mEq/L (ref 137–147)

## 2014-03-30 LAB — HEPARIN LEVEL (UNFRACTIONATED): Heparin Unfractionated: 0.35 IU/mL (ref 0.30–0.70)

## 2014-03-30 MED ORDER — FUROSEMIDE 10 MG/ML IJ SOLN
40.0000 mg | Freq: Two times a day (BID) | INTRAMUSCULAR | Status: DC
  Administered 2014-03-30 – 2014-03-31 (×3): 40 mg via INTRAVENOUS
  Filled 2014-03-30 (×5): qty 4

## 2014-03-30 MED ORDER — HEPARIN SODIUM (PORCINE) 5000 UNIT/ML IJ SOLN
5000.0000 [IU] | Freq: Three times a day (TID) | INTRAMUSCULAR | Status: DC
  Administered 2014-03-30 – 2014-04-03 (×12): 5000 [IU] via SUBCUTANEOUS
  Filled 2014-03-30 (×15): qty 1

## 2014-03-30 MED ORDER — FUROSEMIDE 10 MG/ML IJ SOLN
40.0000 mg | Freq: Once | INTRAMUSCULAR | Status: AC
  Administered 2014-03-30: 40 mg via INTRAVENOUS
  Filled 2014-03-30: qty 4

## 2014-03-30 NOTE — Progress Notes (Signed)
[facility]  ODALIS JORDAN ZOX:[id number] DOB: January 09, 1922 DOA: 03/27/2014 PCP: Ginette Otto, MD  Time spent : 35 mins  Brief narrative: 80 yo woman with PMH of a renal mass (likely cancer per family), HTN, CKD, hypothyroidism, recent UTI, HLD, PD and bradycardia. Presented after a presumed fall. Noted to be more confused and unable to care for herself for about a week per report of family. She had been having diarrhea (which was not new for her) but she had also recently completed a course of antibiotics for a UTI.   In the ED, she was noted to have elevated BP, low grade temperature, tachycardia and an elevated WBC. She was also hypoxic. CXR did not show a source for infection, UA was clean, BC X 2 were sent and Cdiff as well. She had a mildly elevated TnI in the ED to 0.38 without any acute EKG changes.   24 hours after admission she developed elevated TNI with peak at 4.69 and new TWI on EKG in the lateral leads, I and II. She had been started on broad spectrum Abx and flagyl. AM Labs on 03/28/14 revealed a new ARF with Cr of 2.36.   HPI/Subjective: Sts less weak and less SOB today  Assessment/Plan:  SIRS/persistent Leukocytosis -WBCs remain in 20's without clear cut source -repeat CXR 7/7 post hydration c/w volume overload and no evidence of PNA -suspect elevated WBCs due to stress demargination-consider dc empiric anbx's -C Diff negative -UA unremarkable   Hypertensive emergency  - Resolved, off of labetalol drip  - cont home medications  - CT scan of the head showed no acute pathology   Elevated troponin /NSTEMI - Troponin peaked at 4.69  - AM EKG with new TWI in lateral leads and I, II  - Cardiology consulted and suspects LAD lesion but given renal function and advanced age (as well as pt preference to not pursue cath) plan is to focus on medical rxn-also noted with new apical/distal wall akinesia on echo this  admission -Cardiology documents no beta blocker since patient has intermittent Mobitz 1 on telemetry  Acute-on-chronic kidney injury / Rhabdomyolosis - baseline renal function: 17/1.61 March 2015 and presented with Scr 1,71- after admit peaked to 2.36 with BUN 36 and with hydration slowly trending down but not yet at baseline -CPK also mildly elevated at admit at 203 but has peaked at 1124 with slow trend down -IVFs were time limited but with poor intake.anorexia so resumed at 100/hr on 7/6 (has underlying grade 2 DD)- overnight developed s/ c/w volume overload so IVFs to [facility] -Lasix BID and follow-strict I/O (place foley if necessary) and weigh daily  Metabolic acidosis  - Resolved and likely 2/2 ARF and inadequate peripheral perfusion and possibly from pre-admission diarrhea  Acute on chronic diarrhea -C. difficile toxin PCR negative so discontinued contact isolation and Flagyl 7/6  Hypothyroidism  - TSH WNL  - Continue levothyroxine   Acute respiratory failure with Hypoxia  - still requiring [place] oxygen -CXR in am to ensure no PNA visible post hydration  DVT prophylaxis: IV heparin Code Status: Full Family Communication: No family at bedside Disposition Plan/Expected LOS: Step down  Consultants: Cardiology  Procedures: 2-D echocardiogram - Left ventricle: The cavity size was normal. Wall thickness was increased in a pattern of mild LVH. Systolic function was normal. The estimated ejection fraction was in the range of 50% to 55%. There is akinesis of the apical/ distal myocardium. Features  are consistent with a pseudonormal left ventricular filling pattern, with concomitant abnormal relaxation and increased filling pressure (grade 2 diastolic dysfunction). - Mitral valve: There was mild regurgitation. - Left atrium: The atrium was mildly dilated. Impressions: - When compared to prior echocardiogram 12/19/13, apical/ distal wall akinesis is new.  Antibiotics: Cefepime 7/4  >>> Flagyl 7/4 >>> 7/6 Vancomycin 7/4 >>>  Objective: Blood pressure 152/57, pulse 95, temperature 97.6 F (36.4 C), temperature source Oral, resp. rate 33, height 5' (1.524 m), weight 125 lb (56.7 kg), SpO2 96.00%.  Intake/Output Summary (Last 24 hours) at 03/30/14 1245 Last data filed at 03/30/14 1200  Gross per 24 hour  Intake 1877.17 ml  Output    109 ml  Net 1768.17 ml   Exam: General: No acute respiratory distress Lungs: Bilateral crackles on posterior exam, 4L Cardiovascular: Regular rate and rhythm without murmur gallop or rub normal S1 and S2  Abdomen: Nontender, nondistended, soft, bowel sounds positive, no rebound, no ascites, no appreciable mass Musculoskeletal: No significant cyanosis, clubbing of bilateral lower extremities   Scheduled Meds:  Scheduled Meds: . aspirin EC  81 mg Oral Daily  . atorvastatin  40 mg Oral q1800  . ceFEPime (MAXIPIME) IV  2 g Intravenous Q24H  . feeding supplement (ENSURE COMPLETE)  237 mL Oral BID BM  . furosemide  40 mg Intravenous BID  . heparin subcutaneous  5,000 Units Subcutaneous 3 times per day  . hydrALAZINE  25 mg Oral TID  . levothyroxine  50 mcg Oral QAC breakfast  . montelukast  10 mg Oral QPM  . NIFEdipine  90 mg Oral QHS  . sertraline  25 mg Oral Daily  . sodium chloride  3 mL Intravenous Q12H  . vancomycin  750 mg Intravenous Q48H   Continuous Infusions: . sodium chloride Stopped (03/30/14 0600)    Data Reviewed: Basic Metabolic Panel:  Recent Labs Lab 03/27/14 1501 03/27/14 1930 03/28/14 0655 03/28/14 1142 03/30/14 0027 03/30/14 0515  NA 138  --  140 141 137 139  K 4.0  --  4.5 4.4 7.4* 4.1  CL 98  --  102 103 106 105  CO2 19  --  24 23 12* 17*  GLUCOSE 188*  --  101* 98 106* 121*  BUN 27*  --  36* 37* 32* 31*  CREATININE 1.61* 1.71* 2.36* 2.27* 1.91* 1.84*  CALCIUM 9.3  --  8.6 8.5 8.3* 8.4   Liver Function Tests:  Recent Labs Lab 03/27/14 1501 03/28/14 0655 03/28/14 1142  AST 23 44* 48*   ALT 14 14 15   ALKPHOS 117 73 78  BILITOT 0.4 0.5 0.6  PROT 8.2 6.6 7.2  ALBUMIN 3.7 2.8* 3.1*   CBC:  Recent Labs Lab 03/27/14 1501 03/27/14 1930 03/28/14 0655 03/28/14 2115 03/30/14 0240  WBC 23.7* 32.0* 25.6* 22.1* 22.0*  NEUTROABS 21.2*  --   --   --   --   HGB 15.5* 14.2 12.9 12.3 12.0  HCT 46.9* 42.5 39.9 38.0 37.3  MCV 90.0 88.7 88.1 88.4 88.6  PLT 336 312 249 284 338   Cardiac Enzymes:  Recent Labs Lab 03/27/14 1501 03/27/14 1930 03/28/14 0040 03/28/14 0655 03/28/14 1142  CKTOTAL 85 203* 382* 1124* 1122*  TROPONINI 0.38* 3.46* 3.56* 4.53*  4.69* 3.75*   BNP (last 3 results)  Recent Labs  12/18/13 1846 03/27/14 1501  PROBNP 2335.0* 14898.0*    Recent Results (from the past 240 hour(s))  CULTURE, BLOOD (ROUTINE X 2)  Status: None   Collection Time    03/27/14  4:45 PM      Result Value Ref Range Status   Specimen Description BLOOD LEFT ANTECUBITAL   Final   Special Requests BOTTLES DRAWN AEROBIC AND ANAEROBIC 6CC EA   Final   Culture  Setup Time     Final   Value: 03/28/2014 02:10     Performed at [facility]   Culture     Final   Value:        BLOOD CULTURE RECEIVED NO GROWTH TO DATE CULTURE WILL BE HELD FOR 5 DAYS BEFORE ISSUING A FINAL NEGATIVE REPORT     Performed at [facility]   Report Status PENDING   Incomplete  CULTURE, BLOOD (ROUTINE X 2)     Status: None   Collection Time    03/27/14  5:24 PM      Result Value Ref Range Status   Specimen Description BLOOD RIGHT HAND   Final   Special Requests BOTTLES DRAWN AEROBIC ONLY 5CC   Final   Culture  Setup Time     Final   Value: 03/28/2014 02:10     Performed at [facility]   Culture     Final   Value:        BLOOD CULTURE RECEIVED NO GROWTH TO DATE CULTURE WILL BE HELD FOR 5 DAYS BEFORE ISSUING A FINAL NEGATIVE REPORT     Performed at [facility]   Report Status PENDING   Incomplete  MRSA PCR SCREENING     Status: None   Collection Time     03/27/14  6:08 PM      Result Value Ref Range Status   MRSA by PCR NEGATIVE  NEGATIVE Final   Comment:            The GeneXpert MRSA Assay (FDA     approved for NASAL specimens     only), is one component of a     comprehensive MRSA colonization     surveillance program. It is not     intended to diagnose MRSA     infection nor to guide or     monitor treatment for     MRSA infections.  CLOSTRIDIUM DIFFICILE BY PCR     Status: None   Collection Time    03/28/14  5:17 PM      Result Value Ref Range Status   C difficile by pcr NEGATIVE  NEGATIVE Final     Studies:  Recent x-ray studies have been reviewed in detail by the Attending Physician      Junious SilkAllison Ellis, ANP [facility] Office  [phone] Pager [phone]  **If unable to reach the above provider after paging please contact the Flow Manager @ [phone]  On-Call/Text Page:      [email or web address]      password [email or web address]  If 7PM-7AM, please contact night-coverage [email or web address] Password [email or web address] 03/30/2014, 12:45 PM   LOS: 3 days   Examined patient, and discussed assessment and plan with ANP Revonda StandardAllison, agree with above plan. Discuss plan with patient and answered all questions Patient with multiple complex medical problems direct patient care> 35 minutes

## 2014-03-30 NOTE — Progress Notes (Signed)
Physical Therapy Treatment Patient Details Name: Leslie NeatDorothy W [name] MRN: [id number] DOB: January 14, 1922 Today's Date: 03/30/2014    History of Present Illness Pt is a 80 y/o female admitted s/p increased lethargy and fall at home. Great-grandson found pt lying face-up next to her bed the morning of admission, and was not responsive until EMS arrived. In the ED, pt was found to have had a NSTEMI.    PT Comments    Pt with incontinence of stool this session which limited mobility. Appeared fatigued from sitting up in the recliner for the afternoon, and had increased difficulty standing from the chair. Will continue to follow and progress as able per POC.   Follow Up Recommendations  SNF;Supervision/Assistance - 24 hour     Equipment Recommendations  Rolling walker with 5" wheels;3in1 (PT)    Recommendations for Other Services       Precautions / Restrictions Precautions Precautions: Fall Restrictions Weight Bearing Restrictions: No    Mobility  Bed Mobility Overal bed mobility: Needs Assistance Bed Mobility: Sit to Supine       Sit to supine: Mod assist   General bed mobility comments: Mod assist for LE elevation onto bed with max assist +2 for scooting to [facility] with bed pad. Pt able to demonstrate bridging for adjustment of the pads under her.   Transfers Overall transfer level: Needs assistance Equipment used: Rolling walker (2 wheeled) Transfers: Sit to/from [employer];Stand Pivot Transfers Sit to Stand: Mod assist;+2 physical assistance Stand pivot transfers: Mod assist       General transfer comment: Increased assist required due to low recliner height. Pt able to stand ~2 minutes while tech performed peri-care, however pt became more shaky with increased time. Assist to pivot around to the bed for walker placement and general support.  Ambulation/Gait                 Stairs            Wheelchair Mobility    Modified Rankin (Stroke Patients Only)        Balance Overall balance assessment: Needs assistance Sitting-balance support: Feet supported;Bilateral upper extremity supported Sitting balance-Leahy Scale: Poor     Standing balance support: Bilateral upper extremity supported;During functional activity Standing balance-Leahy Scale: Poor                      Cognition Arousal/Alertness: Awake/alert Behavior During Therapy: WFL for tasks assessed/performed Overall Cognitive Status: Within Functional Limits for tasks assessed                      Exercises      General Comments        Pertinent Vitals/Pain Vitals stable throughout session.     Home Living                      Prior Function            PT Goals (current goals can now be found in the care plan section) Acute Rehab PT Goals Patient Stated Goal: None stated PT Goal Formulation: With patient Time For Goal Achievement: 04/13/14 Potential to Achieve Goals: Fair Progress towards PT goals: Progressing toward goals    Frequency  Min 2X/week    PT Plan Current plan remains appropriate    Co-evaluation             End of Session Equipment Utilized During Treatment: Gait belt;Oxygen Activity Tolerance: Patient limited by fatigue  Patient left: in bed;with bed alarm set;with call bell/phone within reach;with nursing/sitter in room     Time: 1656-1706 PT Time Calculation (min): 10 min  Charges:  $Therapeutic Activity: 8-22 mins                    G Codes:      Ruthann CancerHamilton, Jill Stopka 03/30/2014, 5:19 PM  Ruthann CancerLaura Hamilton, PT, DPT Acute Rehabilitation Services Pager: [phone]

## 2014-03-30 NOTE — Progress Notes (Signed)
Foley cath ordered for  strict I and O, pt on lasix iv bid, gets incontinent at times, perianal excoriated. Huddle made and not indicated due to risk for UTI.

## 2014-03-30 NOTE — Progress Notes (Signed)
Donnamarie PoagK. Kirby notified of audible crackles and rhonchi noted. Orders pending.

## 2014-03-30 NOTE — Progress Notes (Signed)
[facility]. Spoke with patient who states she lives in apartment living and is not sure whether what her disposition will be upon discharge. Explained services to patient in case she discharges home and not SNF. Patient states she would like to speak with her daughter first before making any decisions. Requested that [facility] information be left at bedside for her daughter to review. Will update inpatient RNCM.  Raiford NobleAtika Ronell Duffus, MSN-RN,BSN- [facility] Care Management Hospital [phone]- [phone]

## 2014-03-30 NOTE — Progress Notes (Signed)
Daughter now in room. Spoke with daughter, Elisabeth Pigeonatti Burns per patient's request. States patient will need rehab. Prefers [facility]. States her mother was at [facility] in the past. Clayborne Danaatti also requests update on patient's condition and plan of care. Made patient's nurse aware. Will make inpatient RNCM aware of patient request for SNF placement. Daughter, Elisabeth Pigeonatti Burns contact number is [phone] (cell). Hyman Hopestika Joss Friedel,MSN- RN,BSN- [facility] Care Management Hospital [phone]- [phone]

## 2014-03-30 NOTE — Progress Notes (Signed)
Noted consult that pt will need rehab and that pt's daughter prefers [facility]. Will make referral tomorrow and follow up with pt/family. Full assessment and placement note to follow.   Maryclare LabradorJulie Athanasios Heldman, MSW, [facility]ierCSWA Clinical Social Worker [phone]

## 2014-03-30 NOTE — Progress Notes (Signed)
    Subjective:  Denies CP; mild dyspnea   Objective:  Filed Vitals:   03/29/14 2332 03/30/14 0000 03/30/14 0351 03/30/14 0745  BP: 140/78  156/49 170/50  Pulse:   103 74  Temp:  99 F (37.2 C) 98.6 F (37 C) 97.4 F (36.3 C)  TempSrc:  Oral Oral Oral  Resp: 25  20 20   Height:      Weight:      SpO2: 91%  99% 91%    Intake/Output from previous day:  Intake/Output Summary (Last 24 hours) at 03/30/14 0937 Last data filed at 03/30/14 0800  Gross per 24 hour  Intake 1885.67 ml  Output      8 ml  Net 1877.67 ml    Physical Exam: Physical exam: Well-developed frail in no acute distress.  Skin is warm and dry.  HEENT is normal.  Neck is supple.  Chest with diminished BS bases Cardiovascular exam is regular rate and rhythm.  Abdominal exam nontender or distended. No masses palpated. Extremities show trace edema. neuro grossly intact    Lab Results: Basic Metabolic Panel:  Recent Labs  16/06/9606/07/15 0027 03/30/14 0515  NA 137 139  K 7.4* 4.1  CL 106 105  CO2 12* 17*  GLUCOSE 106* 121*  BUN 32* 31*  CREATININE 1.91* 1.84*  CALCIUM 8.3* 8.4   CBC:  Recent Labs  03/27/14 1501  03/28/14 2115 03/30/14 0240  WBC 23.7*  < > 22.1* 22.0*  NEUTROABS 21.2*  --   --   --   HGB 15.5*  < > 12.3 12.0  HCT 46.9*  < > 38.0 37.3  MCV 90.0  < > 88.4 88.6  PLT 336  < > 284 338  < > = values in this interval not displayed. Cardiac Enzymes:  Recent Labs  03/28/14 0040 03/28/14 0655 03/28/14 1142  CKTOTAL 382* 1124* 1122*  TROPONINI 3.56* 4.53*  4.69* 3.75*   Telemetry showed sinus rhythm with PACs and occasional Mobitz 1.  Assessment/Plan:  1 non-ST elevation myocardial infarction-the patient has had no chest pain. Event occurred in the setting of probable infection and may be stress-induced. Electrocardiogram shows anterior T-wave inversion suggestive of LAD lesion. Continue aspirin and statin. Change heparin to SQ. No beta blocker as she has intermittent Mobitz  1 on telemetry. She is very frail with multiple medical problems and would be a poor candidate for cardiac catheterization. I also discussed this with her previously and she does not want procedures. Therefore plan medical therapy. Note echocardiogram shows ejection fraction 50-55%. 2 hypertension-continue present medications and adjust as needed. 3 acute on chronic renal failure-follow renal function. 4 possible infection-management per primary care. 5 renal mass 6 Acute on chronic diastolic CHF-volume overloaded; DC IVFs; treat with lasix and follow renal function.  Leslie MillersBrian Crenshaw 03/30/2014, 9:37 AM

## 2014-03-30 NOTE — Care Management Note (Signed)
    Page 1 of 1   03/30/2014     11:37:13 AM CARE MANAGEMENT NOTE 03/30/2014  Patient:  Tilman NeatBAKER,Kyannah W   Account Number:  [id number]  Date Initiated:  03/30/2014  Documentation initiated by:  Avie ArenasBROWN,Shamiya Demeritt  Subjective/Objective Assessment:   Admitted with SIRS     Action/Plan:   Anticipated DC Date:  04/02/2014   Anticipated DC Plan:  SKILLED NURSING FACILITY  In-house referral  Clinical Social Worker      DC Planning Services  CM consult      Choice offered to / List presented to:             Status of service:  In process, will continue to follow Medicare Important Message given?  YES (If response is "NO", the following Medicare IM given date fields will be blank) Date Medicare IM given:  03/30/2014 Medicare IM given by:  [facility]Abdi Husak Date Additional Medicare IM given:   Additional Medicare IM given by:    Discharge Disposition:    Per UR Regulation:  Reviewed for med. necessity/level of care/duration of stay  If discussed at Long Length of Stay Meetings, dates discussed:    Comments:  ContactElisabeth Pigeon:  Burns,Patti Daughter [phone]   [phone]                 Azzie RoupBurns,Donnie Other [phone]  03-30-14 11:30am Avie ArenasSarah Eain Mullendore, RNBSN [phone] Talked with patient states lives in apt and someone is with her all the time.  Called daughter and talked with son in law Donnie.  States she lives [facility] [address] - ind [facility] and is independent.  They have someone there that assists with home chores.  Have had HH in past but can't remember who.   In looking at past notes was discharged to SNF [facility] earlier in year.  PT/OT ordered.  Son in law also states she is very indpendent and hard headed and only wants to go home to apt.  CM will continue to follow.

## 2014-03-30 NOTE — Evaluation (Signed)
Physical Therapy Evaluation Patient Details Name: Tilman NeatDorothy W Louison MRN: [id number] DOB: 01-03-1922 Today's Date: 03/30/2014   History of Present Illness  Pt is a 80 y/o female admitted s/p increased lethargy and fall at home. Great-grandson found pt lying face-up next to her bed the morning of admission, and was not responsive until EMS arrived. In the ED, pt was found to have had a NSTEMI.  Clinical Impression  Pt admitted with the above. Pt currently with functional limitations due to the deficits listed below (see PT Problem List). At the time of PT eval pt required increased assist for basic transfers that she was reportedly performing independently PTA. Pt will benefit from skilled PT to increase their independence and safety with mobility to allow discharge to the venue listed below.       Follow Up Recommendations SNF;Supervision/Assistance - 24 hour    Equipment Recommendations  Rolling walker with 5" wheels;3in1 (PT)    Recommendations for Other Services       Precautions / Restrictions Precautions Precautions: Fall Restrictions Weight Bearing Restrictions: No      Mobility  Bed Mobility Overal bed mobility: Needs Assistance Bed Mobility: Supine to Sit     Supine to sit: Mod assist     General bed mobility comments: Pt was able to initiate transfer to EOB, however required assist for trunk elevation and initial sitting balance. Bed pad was used to assist pt in scooting and complete transfer to full EOB.   Transfers Overall transfer level: Needs assistance Equipment used: Rolling walker (2 wheeled) Transfers: Sit to/from UGI CorporationStand;Stand Pivot Transfers Sit to Stand: Mod assist Stand pivot transfers: Mod assist       General transfer comment: Pt able to power-up to full stand with increased time and mod assist. VC's for sequencing and assist for walker placement as well as steadying assist for SPT. Pt incontinent of stool and was able to hold static standing ~1 minute  while therapist performed peri-care.   Ambulation/Gait                Stairs            Wheelchair Mobility    Modified Rankin (Stroke Patients Only)       Balance Overall balance assessment: Needs assistance Sitting-balance support: Bilateral upper extremity supported Sitting balance-Leahy Scale: Poor   Postural control: Posterior lean;Left lateral lean Standing balance support: Bilateral upper extremity supported Standing balance-Leahy Scale: Poor                               Pertinent Vitals/Pain Vitas stable throughout session. No pain reported.     Home Living Family/patient expects to be discharged to:: Private residence Living Arrangements: Alone Available Help at Discharge: Family;Personal care attendant Type of Home: Apartment Home Access: Level entry     Home Layout: One level Home Equipment: Walker - 4 wheels Additional Comments: Personal care attendent comes everyday and stays during the day. Great grandson was staying with her at night. Pts apartment building does not provide meals    Prior Function Level of Independence: Independent with assistive device(s)         Comments: No longer driving. Aide fixes meals for her. Pt reports being independent for ADL's.     Hand Dominance   Dominant Hand: Right    Extremity/Trunk Assessment   Upper Extremity Assessment: Defer to OT evaluation  Lower Extremity Assessment: Generalized weakness      Cervical / Trunk Assessment: Kyphotic (Forward head posture)  Communication   Communication: No difficulties  Cognition Arousal/Alertness: Awake/alert Behavior During Therapy: WFL for tasks assessed/performed Overall Cognitive Status: Within Functional Limits for tasks assessed                      General Comments      Exercises        Assessment/Plan    PT Assessment Patient needs continued PT services  PT Diagnosis Difficulty  walking;Generalized weakness   PT Problem List Decreased strength;Decreased range of motion;Decreased activity tolerance;Decreased balance;Decreased mobility;Decreased knowledge of use of DME;Decreased safety awareness;Decreased knowledge of precautions;Cardiopulmonary status limiting activity  PT Treatment Interventions Gait training;DME instruction;Stair training;Functional mobility training;Therapeutic activities;Therapeutic exercise;Neuromuscular re-education;Patient/family education   PT Goals (Current goals can be found in the Care Plan section) Acute Rehab PT Goals Patient Stated Goal: None stated PT Goal Formulation: With patient Time For Goal Achievement: 04/13/14 Potential to Achieve Goals: Fair    Frequency Min 2X/week   Barriers to discharge Decreased caregiver support      Co-evaluation               End of Session Equipment Utilized During Treatment: Gait belt;Oxygen Activity Tolerance: Patient limited by fatigue;Patient tolerated treatment well Patient left: in chair;with call bell/phone within reach;Other (comment) (chair alarm pad in chair - RN to retrieve box) Nurse Communication: Mobility status;Other (comment) (RN to get chair alarm box. Pad in chair end of session.)         Time: 1151-1226 PT Time Calculation (min): 35 min   Charges:   PT Evaluation $Initial PT Evaluation Tier I: 1 Procedure PT Treatments $Therapeutic Activity: 23-37 mins   PT G Codes:          Ruthann CancerHamilton, Karren Newland 03/30/2014, 1:39 PM  Ruthann CancerLaura Hamilton, PT, DPT Acute Rehabilitation Services Pager: [phone]

## 2014-03-31 LAB — BASIC METABOLIC PANEL
ANION GAP: 21 — AB (ref 5–15)
BUN: 29 mg/dL — ABNORMAL HIGH (ref 6–23)
CALCIUM: 8.7 mg/dL (ref 8.4–10.5)
CO2: 17 mEq/L — ABNORMAL LOW (ref 19–32)
Chloride: 94 mEq/L — ABNORMAL LOW (ref 96–112)
Creatinine, Ser: 1.96 mg/dL — ABNORMAL HIGH (ref 0.50–1.10)
GFR calc Af Amer: 25 mL/min — ABNORMAL LOW (ref >90)
GFR, EST NON AFRICAN AMERICAN: 21 mL/min — AB (ref >90)
Glucose, Bld: 111 mg/dL — ABNORMAL HIGH (ref 70–99)
Potassium: 3.4 mEq/L — ABNORMAL LOW (ref 3.7–5.3)
SODIUM: 132 meq/L — AB (ref 137–147)

## 2014-03-31 LAB — CBC WITH DIFFERENTIAL/PLATELET
BASOS PCT: 0 % (ref 0–1)
Basophils Absolute: 0 10*3/uL (ref 0.0–0.1)
EOS PCT: 0 % (ref 0–5)
Eosinophils Absolute: 0 10*3/uL (ref 0.0–0.7)
HEMATOCRIT: 38.4 % (ref 36.0–46.0)
Hemoglobin: 12.6 g/dL (ref 12.0–15.0)
Lymphocytes Relative: 16 % (ref 12–46)
Lymphs Abs: 2.7 10*3/uL (ref 0.7–4.0)
MCH: 28.8 pg (ref 26.0–34.0)
MCHC: 32.8 g/dL (ref 30.0–36.0)
MCV: 87.7 fL (ref 78.0–100.0)
Monocytes Absolute: 1.5 10*3/uL — ABNORMAL HIGH (ref 0.1–1.0)
Monocytes Relative: 9 % (ref 3–12)
NEUTROS ABS: 12.5 10*3/uL — AB (ref 1.7–7.7)
Neutrophils Relative %: 75 % (ref 43–77)
Platelets: 340 10*3/uL (ref 150–400)
RBC: 4.38 MIL/uL (ref 3.87–5.11)
RDW: 16 % — AB (ref 11.5–15.5)
WBC: 16.7 10*3/uL — AB (ref 4.0–10.5)

## 2014-03-31 LAB — CK: Total CK: 296 U/L — ABNORMAL HIGH (ref 7–177)

## 2014-03-31 MED ORDER — LORAZEPAM 0.5 MG PO TABS
0.5000 mg | ORAL_TABLET | Freq: Every evening | ORAL | Status: DC | PRN
  Administered 2014-03-31: 0.5 mg via ORAL
  Filled 2014-03-31: qty 1

## 2014-03-31 NOTE — Progress Notes (Signed)
Clinical Social Work Department BRIEF PSYCHOSOCIAL ASSESSMENT 03/31/2014  Patient:  Leslie [name],Leslie [name]     Account Number:  [id number]     Admit date:  03/27/2014  Clinical Social Worker:  Varney BilesANDERSON,Coran Dipaola, LCSWA  Date/Time:  03/31/2014 01:11 PM  Referred by:  Physician  Date Referred:  03/31/2014 Referred for  SNF Placement   Other Referral:   Interview type:  Patient Other interview type:    PSYCHOSOCIAL DATA Living Status:  FACILITY Admitted from facility:  Other Level of care:  Independent Living Primary support name:  Elisabeth Pigeonatti Burns ([phone]) Primary support relationship to patient:  CHILD, ADULT Degree of support available:   Good--pt lives at [facility] and has support from adult children.    CURRENT CONCERNS Current Concerns  Post-Acute Placement   Other Concerns:    SOCIAL WORK ASSESSMENT / PLAN CSW spoke with pt about SNF recommendation. Noted that pt has been to [facility] in the recent past, and pt confirmed and states she would like to go here for rehab. Referral made, and facility can take pt. Updated facility that pt likely moving to floor soon and likely will be ready for discharge by the end of the week. Will give new CSW handoff when pt transfers.   Assessment/plan status:  Psychosocial Support/Ongoing Assessment of Needs Other assessment/ plan:   Information/referral to community resources:   SNF ([facility])    PATIENT'S/FAMILY'S RESPONSE TO PLAN OF CARE: Good--pt friendly and engaged in conversation with CSW. Understands CSW role in discharge.       Maryclare LabradorJulie Heaven Meeker, MSW, [facility] PLLCCSWA Clinical Social Worker [phone]

## 2014-03-31 NOTE — Progress Notes (Signed)
Received call from patient's daughter, Leslie [name], checking in on patient. Daughter requesting palliative consult for discharge planning. Will continue to monitor. Hortencia ConradiWendi Demisha Nokes, RN

## 2014-03-31 NOTE — Progress Notes (Signed)
Pt transferred to 6E. Covering CSW provided handoff. This CSW signing off.   Maryclare LabradorJulie Reco Shonk, MSW, [facility]antecaCSWA Clinical Social Worker [phone]

## 2014-03-31 NOTE — Progress Notes (Signed)
    Subjective:  Denies CP or dyspnea.   Objective:  Filed Vitals:   03/31/14 0400 03/31/14 0500 03/31/14 0600 03/31/14 0747  BP: 148/50  157/77 166/56  Pulse: 75  91 78  Temp:    97.3 F (36.3 C)  TempSrc:    Oral  Resp: 25  24 23   Height:      Weight:  128 lb 8.5 oz (58.3 kg)    SpO2: 90%  97% 97%    Intake/Output from previous day:  Intake/Output Summary (Last 24 hours) at 03/31/14 1022 Last data filed at 03/30/14 2300  Gross per 24 hour  Intake    153 ml  Output    400 ml  Net   -247 ml    Physical Exam: Physical exam: Well-developed frail in no acute distress.  Skin is warm and dry.  HEENT is normal.  Neck is supple.  Chest with diminished BS bases Cardiovascular exam is regular rate and rhythm.  Abdominal exam nontender or distended. No masses palpated. Extremities show no edema. neuro grossly intact    Lab Results: Basic Metabolic Panel:  Recent Labs  21/30/8606/04/07 0515 03/31/14 0313  NA 139 132*  K 4.1 3.4*  CL 105 94*  CO2 17* 17*  GLUCOSE 121* 111*  BUN 31* 29*  CREATININE 1.84* 1.96*  CALCIUM 8.4 8.7   CBC:  Recent Labs  03/30/14 0240 03/31/14 0313  WBC 22.0* 16.7*  NEUTROABS  --  12.5*  HGB 12.0 12.6  HCT 37.3 38.4  MCV 88.6 87.7  PLT 338 340   Cardiac Enzymes:  Recent Labs  03/28/14 1142 03/31/14 0313  CKTOTAL 1122* 296*  TROPONINI 3.75*  --    Telemetry showed sinus rhythm with PACs  Assessment/Plan:  1 non-ST elevation myocardial infarction-the patient has had no chest pain. Event may have been stress-induced. Electrocardiogram shows anterior T-wave inversion suggestive of LAD lesion. Continue aspirin and statin. No beta blocker as she has intermittent Mobitz 1 on telemetry. She is very frail with multiple medical problems and would be a poor candidate for cardiac catheterization. I also discussed this with her previously and she does not want procedures. Therefore plan medical therapy. Note echocardiogram shows ejection  fraction 50-55%. 2 hypertension-continue present medications and adjust as needed. 3 acute on chronic renal failure-follow renal function. 4 possible infection-management per primary care. 5 renal mass 6 Acute on chronic diastolic CHF-volume overloaded but improving;  treat with lasix and follow renal function; will most likely be able to reduce dose in AM. Patient can be transferred to floor from a cardiac standpoint  Olga MillersBrian Crenshaw 03/31/2014, 10:22 AM

## 2014-03-31 NOTE — Evaluation (Signed)
Occupational Therapy Evaluation Patient Details Name: Leslie [name] MRN: [id number] DOB: 10-10-1921 Today's Date: 03/31/2014    History of Present Illness Pt is a 80 y/o female admitted s/p increased lethargy and fall at home. Great-grandson found pt lying face-up next to her bed the morning of admission, and was not responsive until EMS arrived. In the ED, pt was found to have had a NSTEMI.   Clinical Impression   Pt demonstrates decline in function with ADLs and ADL mobility safety with decreased strength, balance and endurance. Pt has decreased strength and AROM in R shoulder due to hx of fracture per pt report. Pt would benefit from acute OT services to address impairments to increase level of function and safety    Follow Up Recommendations  SNF;Supervision/Assistance - 24 hour    Equipment Recommendations  None recommended by OT;Other (comment) (TBD at next venue of care)    Recommendations for Other Services       Precautions / Restrictions Precautions Precautions: Fall Restrictions Weight Bearing Restrictions: No      Mobility Bed Mobility Overal bed mobility: Needs Assistance Bed Mobility: Sit to Supine     Supine to sit: Mod assist     General bed mobility comments: Mod assist for LE elevation onto bed with max assist +2 for scooting to [facility] with bed pad. Pt able to demonstrate bridging for adjustment of the pads under her.   Transfers Overall transfer level: Needs assistance   Transfers: Sit to/from Stand;Stand Pivot Transfers Sit to Stand: Mod assist;+2 physical assistance Stand pivot transfers: Mod assist            Balance Overall balance assessment: Needs assistance Sitting-balance support: No upper extremity supported;Feet supported Sitting balance-Leahy Scale: Fair     Standing balance support: Single extremity supported;During functional activity Standing balance-Leahy Scale: Poor                              ADL Overall ADL's  : Needs assistance/impaired     Grooming: Wash/dry hands;Wash/dry face;Sitting;Minimal assistance Grooming Details (indicate cue type and reason): assist for balance/support at EOB. Fair sitting balance Upper Body Bathing: Moderate assistance;Sitting Upper Body Bathing Details (indicate cue type and reason): assist for balance/support at EOB. Fair sitting balance Lower Body Bathing: Total assistance   Upper Body Dressing : Sitting;Moderate assistance Upper Body Dressing Details (indicate cue type and reason): assist for balance/support at EOB. Fair sitting balance Lower Body Dressing: Total assistance   Toilet Transfer: Moderate assistance;+2 for physical assistance;BSC       Tub/ Shower Transfer: Total assistance   Functional mobility during ADLs: Moderate assistance;+2 for physical assistance General ADL Comments: nurse tech assisted OT this session, pt +2 physical assist with mobility and total A with LB ADLs     Vision  wears glasses per pt report                   Perception Perception Perception Tested?: No   Praxis Praxis Praxis tested?: Not tested    Pertinent Vitals/Pain No c/o pain, VSS     Hand Dominance Right   Extremity/Trunk Assessment Upper Extremity Assessment Upper Extremity Assessment: Generalized weakness R shoulder impaired due to hx of fracture (decreased AROM and strength, 3/5)   Lower Extremity Assessment Lower Extremity Assessment: Defer to PT evaluation   Cervical / Trunk Assessment Cervical / Trunk Assessment: Kyphotic   Communication Communication Communication: No difficulties   Cognition Arousal/Alertness:  Awake/alert Behavior During Therapy: WFL for tasks assessed/performed Overall Cognitive Status: Within Functional Limits for tasks assessed                     General Comments    Pt pleasant and coopertative                 Home Living Family/patient expects to be discharged to:: Private  residence Living Arrangements: Alone Available Help at Discharge: Family;Personal care attendant Type of Home: Apartment Home Access: Level entry     Home Layout: One level     Bathroom Shower/Tub: Producer, television/film/videoWalk-in shower   Bathroom Toilet: Standard     Home Equipment: Environmental consultantWalker - 4 wheels   Additional Comments: Personal care attendent comes everyday and stays during the day. Great grandson was staying with her at night. Pts apartment building does not provide meals      Prior Functioning/Environment Level of Independence: Independent with assistive device(s)        Comments: No longer driving. Aide fixes meals for her. Pt reports being independent for dressing and aide assists with bathing    OT Diagnosis: Generalized weakness   OT Problem List: Decreased strength;Decreased knowledge of use of DME or AE;Decreased activity tolerance;Decreased safety awareness;Impaired balance (sitting and/or standing)   OT Treatment/Interventions: Self-care/ADL training;Therapeutic exercise;Patient/family education;Neuromuscular education;Balance training;Therapeutic activities;DME and/or AE instruction    OT Goals(Current goals can be found in the care plan section) Acute Rehab OT Goals Patient Stated Goal: go home wiht more help OT Goal Formulation: With patient Time For Goal Achievement: 04/07/14 Potential to Achieve Goals: Good ADL Goals Pt Will Perform Grooming: with min guard assist;with supervision;with set-up;sitting Pt Will Perform Upper Body Bathing: with min assist;sitting Pt Will Perform Lower Body Bathing: with min assist;sitting/lateral leans Pt Will Perform Upper Body Dressing: with min assist;sitting Pt Will Transfer to Toilet: with mod assist;with min assist;bedside commode  OT Frequency: Min 2X/week   Barriers to D/C:    pt live alone and has personal care attendant during the day and great grandson stays at night                     End of Session    Activity  Tolerance: Patient tolerated treatment well Patient left: in chair;with call bell/phone within reach;with nursing/sitter in room   Time: 1020-1041 OT Time Calculation (min): 21 min Charges:  OT General Charges $OT Visit: 1 Procedure OT Evaluation $Initial OT Evaluation Tier I: 1 Procedure OT Treatments $Therapeutic Activity: 8-22 mins G-Codes:    Leslie [name], Leslie [name] Leslie [name] 03/31/2014, 1:22 PM

## 2014-03-31 NOTE — Progress Notes (Signed)
[facility]  Leslie [name] WJX:[id number] DOB: [birth year] DOA: 03/27/2014 PCP: Ginette OttoSTONEKING,HAL THOMAS, MD  Time spent : 35 mins  Brief narrative: 80 yo woman with PMH of a renal mass (likely cancer per family), HTN, CKD, hypothyroidism, recent UTI, HLD, and bradycardia who presented after a presumed fall. She was noted to be more confused and unable to care for herself for about a week per the report of her family. She had been having diarrhea (which was not new for her) and she had also recently completed a course of antibiotics for a UTI.   In the ED, she was noted to have elevated BP, low grade temperature, tachycardia, and an elevated WBC. She was also hypoxic. CXR did not show a source for infection, UA was clean.  She had a mildly elevated Trop in the ED to 0.38 without any acute EKG changes.   24 hours after admission she developed elevated TNI with peak at 4.69 and new TWI on EKG in the lateral leads, I and II. She had been started on broad spectrum Abx and flagyl. AM Labs on 03/28/14 revealed a new ARF with Cr of 2.36.  A C diff toxin assay proved to be normal, and f/u CXRs also remained w/o evidence of an acute infiltrate.       HPI/Subjective: Pt is more alert and conversant.  She denies current CP, N/V, abdom pain, diarrhea, or sob.  She agrees that a rehab stay at a SNF is a good idea.    Assessment/Plan:  SIRS / persistent Leukocytosis -repeat CXR 7/7 post hydration c/w volume overload with no evidence of PNA -with no evidence of a focal infection will stop abx and follow clinically  -C Diff negative -UA unremarkable   Hypertensive emergency  - Resolved, off of labetalol drip  - cont home medications  - CT scan of the head showed no acute pathology   Elevated troponin /NSTEMI - Troponin peaked at 4.69  - Cardiology consulted and suspects LAD lesion but given renal function and advanced age (as well as pt preference to not pursue cath)  plan is to focus on medical rxn - also noted with new apical/distal wall akinesia on echo this admission -Cardiology documents no beta blocker since patient has intermittent Mobitz 1 on telemetry  Acute-on-chronic kidney injury / Rhabdomyolosis - baseline renal function: 17 / 1.61 March 2015 and presented with Scr 1.71- after admit peaked to 2.36 with BUN 36  -CPK also mildly elevated at admit at 203 but peaked at 1124  -Lasix BID and follow-strict I/O and weigh daily  Metabolic acidosis  -likely 2/2 ARF and inadequate peripheral perfusion and possibly from pre-admission diarrhea - cont to follow   Acute on chronic diarrhea -C. difficile toxin PCR negative   Hypothyroidism  - TSH WNL  - Continue levothyroxine   Acute respiratory failure with Hypoxia  -still requiring [place] oxygen -CXR most c/w pulmonary edema - diuresis ongoing under direction of Cardiology   DVT prophylaxis: SQ heparin Code Status: DNR Family Communication: No family at bedside Disposition Plan/Expected LOS: stable for transfer to med bed - follow crt - follow Oxygen sats - begin ambulation - will need eventual SNF admit for rehab stay  Consultants: Cardiology  Procedures: 2-D echocardiogram - Left ventricle: The cavity size was normal. Wall thickness was increased in a pattern of mild LVH. Systolic function was normal. The estimated ejection fraction was in the range of 50% to  55%. There is akinesis of the apical/ distal myocardium. Features are consistent with a pseudonormal left ventricular filling pattern, with concomitant abnormal relaxation and increased filling pressure (grade 2 diastolic dysfunction). - Mitral valve: There was mild regurgitation. - Left atrium: The atrium was mildly dilated. Impressions: - When compared to prior echocardiogram 12/19/13, apical/ distal wall akinesis is new.  Antibiotics: Cefepime 7/4 >>7/7 Flagyl 7/4 >> 7/5 Vancomycin 7/4 >>7/6  Objective: Blood pressure 166/56,  pulse 78, temperature 97.3 F (36.3 C), temperature source Oral, resp. rate 23, height 5' (1.524 m), weight 58.3 kg (128 lb 8.5 oz), SpO2 97.00%.  Intake/Output Summary (Last 24 hours) at 03/31/14 0841 Last data filed at 03/30/14 2300  Gross per 24 hour  Intake  161.5 ml  Output    501 ml  Net -339.5 ml   Exam: General: No acute respiratory distress Lungs: poor air movement in B bases - no wheeze - no focal crackles  Cardiovascular: Regular rate and rhythm without murmur gallop or rub  Abdomen: Nontender, nondistended, soft, bowel sounds positive, no rebound, no ascites, no appreciable mass Musculoskeletal: No significant cyanosis, clubbing of bilateral lower extremities - no signif peripheral edema   Scheduled Meds:  Scheduled Meds: . aspirin EC  81 mg Oral Daily  . atorvastatin  40 mg Oral q1800  . ceFEPime (MAXIPIME) IV  2 g Intravenous Q24H  . feeding supplement (ENSURE COMPLETE)  237 mL Oral BID BM  . furosemide  40 mg Intravenous BID  . heparin subcutaneous  5,000 Units Subcutaneous 3 times per day  . hydrALAZINE  25 mg Oral TID  . levothyroxine  50 mcg Oral QAC breakfast  . montelukast  10 mg Oral QPM  . NIFEdipine  90 mg Oral QHS  . sertraline  25 mg Oral Daily  . sodium chloride  3 mL Intravenous Q12H  . vancomycin  750 mg Intravenous Q48H   Data Reviewed: Basic Metabolic Panel:  Recent Labs Lab 03/28/14 0655 03/28/14 1142 03/30/14 0027 03/30/14 0515 03/31/14 0313  NA 140 141 137 139 132*  K 4.5 4.4 7.4* 4.1 3.4*  CL 102 103 106 105 94*  CO2 24 23 12* 17* 17*  GLUCOSE 101* 98 106* 121* 111*  BUN 36* 37* 32* 31* 29*  CREATININE 2.36* 2.27* 1.91* 1.84* 1.96*  CALCIUM 8.6 8.5 8.3* 8.4 8.7   Liver Function Tests:  Recent Labs Lab 03/27/14 1501 03/28/14 0655 03/28/14 1142  AST 23 44* 48*  ALT 14 14 15   ALKPHOS 117 73 78  BILITOT 0.4 0.5 0.6  PROT 8.2 6.6 7.2  ALBUMIN 3.7 2.8* 3.1*   CBC:  Recent Labs Lab 03/27/14 1501 03/27/14 1930  03/28/14 0655 03/28/14 2115 03/30/14 0240 03/31/14 0313  WBC 23.7* 32.0* 25.6* 22.1* 22.0* 16.7*  NEUTROABS 21.2*  --   --   --   --  12.5*  HGB 15.5* 14.2 12.9 12.3 12.0 12.6  HCT 46.9* 42.5 39.9 38.0 37.3 38.4  MCV 90.0 88.7 88.1 88.4 88.6 87.7  PLT 336 312 249 284 338 340   Cardiac Enzymes:  Recent Labs Lab 03/27/14 1501 03/27/14 1930 03/28/14 0040 03/28/14 0655 03/28/14 1142 03/31/14 0313  CKTOTAL 85 203* 382* 1124* 1122* 296*  TROPONINI 0.38* 3.46* 3.56* 4.53*  4.69* 3.75*  --     Recent Results (from the past 240 hour(s))  CULTURE, BLOOD (ROUTINE X 2)     Status: None   Collection Time    03/27/14  4:45 PM  Result Value Ref Range Status   Specimen Description BLOOD LEFT ANTECUBITAL   Final   Special Requests BOTTLES DRAWN AEROBIC AND ANAEROBIC 6CC EA   Final   Culture  Setup Time     Final   Value: 03/28/2014 02:10     Performed at [facility]   Culture     Final   Value:        BLOOD CULTURE RECEIVED NO GROWTH TO DATE CULTURE WILL BE HELD FOR 5 DAYS BEFORE ISSUING A FINAL NEGATIVE REPORT     Performed at [facility]   Report Status PENDING   Incomplete  CULTURE, BLOOD (ROUTINE X 2)     Status: None   Collection Time    03/27/14  5:24 PM      Result Value Ref Range Status   Specimen Description BLOOD RIGHT HAND   Final   Special Requests BOTTLES DRAWN AEROBIC ONLY 5CC   Final   Culture  Setup Time     Final   Value: 03/28/2014 02:10     Performed at [facility]   Culture     Final   Value:        BLOOD CULTURE RECEIVED NO GROWTH TO DATE CULTURE WILL BE HELD FOR 5 DAYS BEFORE ISSUING A FINAL NEGATIVE REPORT     Performed at [facility]   Report Status PENDING   Incomplete  MRSA PCR SCREENING     Status: None   Collection Time    03/27/14  6:08 PM      Result Value Ref Range Status   MRSA by PCR NEGATIVE  NEGATIVE Final   Comment:            The GeneXpert MRSA Assay (FDA     approved for NASAL specimens      only), is one component of a     comprehensive MRSA colonization     surveillance program. It is not     intended to diagnose MRSA     infection nor to guide or     monitor treatment for     MRSA infections.  CLOSTRIDIUM DIFFICILE BY PCR     Status: None   Collection Time    03/28/14  5:17 PM      Result Value Ref Range Status   C difficile by pcr NEGATIVE  NEGATIVE Final     Studies:  Recent x-ray studies have been reviewed in detail by the Attending Physician  Lonia BloodJeffrey T. Jalayne Ganesh, MD [facility] For Consults/Admissions - Flow Manager - [phone] Office  [phone] Pager [phone]  On-Call/Text Page:      [email or web address]      password [email or web address]  03/31/2014, 8:41 AM   LOS: 4 days

## 2014-03-31 NOTE — Progress Notes (Addendum)
Clinical Social Work Department CLINICAL SOCIAL WORK PLACEMENT NOTE 03/31/2014  Patient:  Leslie [name],Leslie [name]  Account Number:  [id number] Admit date:  03/27/2014  Clinical Social Worker:  Maryclare LabradorJULIE ANDERSON, Theresia MajorsLCSWA  Date/time:  03/31/2014 01:15 PM  Clinical Social Work is seeking post-discharge placement for this patient at the following level of care:   SKILLED NURSING   (*CSW will update this form in Epic as items are completed)   N/A-pt requested [facility]  [facility]family provided with [facility] of Clinical Social Work's list of facilities offering this level of care within the geographic area requested by the patient (or if unable, by the patient's family).  03/31/2014  Patient/family informed of their freedom to choose among providers that offer the needed level of care, that participate in Medicare, Medicaid or managed care program needed by the patient, have an available bed and are willing to accept the patient.  N/A-not sento this facility  Patient/family informed of [facility]' ownership interest in [facility], as well as of the fact that they are under no obligation to receive care at this facility.  PASARR submitted to EDS on EXISTING PASARR number received on EXISTING  FL2 transmitted to all facilities in geographic area requested by pt/family on  03/31/2014 FL2 transmitted to all facilities within larger geographic area on   Patient informed that his/her managed care company has contracts with or will negotiate with  certain facilities, including the following:     Patient/family informed of bed offers received:  03/31/2014 [facility] [place] Physician recommends and patient chooses bed at    [facility] [place] [facility]   Patient to be transferred to facility by EMS Patient and family notified of transfer on 04/05/2014 Name of family member notified:    The  following physician request were entered in Epic:   Additional Comments:   Maryclare LabradorJulie Anderson, MSW, [facility]CSWA Clinical Social Worker [phone]

## 2014-04-01 DIAGNOSIS — R5383 Other fatigue: Secondary | ICD-10-CM

## 2014-04-01 DIAGNOSIS — I509 Heart failure, unspecified: Secondary | ICD-10-CM

## 2014-04-01 DIAGNOSIS — R5381 Other malaise: Secondary | ICD-10-CM

## 2014-04-01 DIAGNOSIS — I441 Atrioventricular block, second degree: Secondary | ICD-10-CM

## 2014-04-01 LAB — BASIC METABOLIC PANEL
ANION GAP: 19 — AB (ref 5–15)
BUN: 32 mg/dL — ABNORMAL HIGH (ref 6–23)
CALCIUM: 8.4 mg/dL (ref 8.4–10.5)
CO2: 18 mEq/L — ABNORMAL LOW (ref 19–32)
Chloride: 94 mEq/L — ABNORMAL LOW (ref 96–112)
Creatinine, Ser: 2.06 mg/dL — ABNORMAL HIGH (ref 0.50–1.10)
GFR calc non Af Amer: 20 mL/min — ABNORMAL LOW (ref >90)
GFR, EST AFRICAN AMERICAN: 23 mL/min — AB (ref >90)
Glucose, Bld: 97 mg/dL (ref 70–99)
POTASSIUM: 3.5 meq/L — AB (ref 3.7–5.3)
SODIUM: 131 meq/L — AB (ref 137–147)

## 2014-04-01 LAB — CBC
HCT: 38 % (ref 36.0–46.0)
Hemoglobin: 12.4 g/dL (ref 12.0–15.0)
MCH: 28.8 pg (ref 26.0–34.0)
MCHC: 32.6 g/dL (ref 30.0–36.0)
MCV: 88.2 fL (ref 78.0–100.0)
PLATELETS: 321 10*3/uL (ref 150–400)
RBC: 4.31 MIL/uL (ref 3.87–5.11)
RDW: 15.7 % — ABNORMAL HIGH (ref 11.5–15.5)
WBC: 13.5 10*3/uL — ABNORMAL HIGH (ref 4.0–10.5)

## 2014-04-01 LAB — CK: Total CK: 168 U/L (ref 7–177)

## 2014-04-01 LAB — LACTIC ACID, PLASMA: Lactic Acid, Venous: 1 mmol/L (ref 0.5–2.2)

## 2014-04-01 MED ORDER — LORAZEPAM 0.5 MG PO TABS: 0.5000 mg | ORAL_TABLET | Freq: Two times a day (BID) | ORAL | Status: DC | PRN

## 2014-04-01 MED ORDER — FUROSEMIDE 40 MG PO TABS
40.0000 mg | ORAL_TABLET | Freq: Every day | ORAL | Status: DC
  Administered 2014-04-01 – 2014-04-05 (×5): 40 mg via ORAL
  Filled 2014-04-01 (×6): qty 1

## 2014-04-01 MED ORDER — BOOST / RESOURCE BREEZE PO LIQD
1.0000 | Freq: Two times a day (BID) | ORAL | Status: DC
  Administered 2014-04-01 – 2014-04-03 (×5): 1 via ORAL

## 2014-04-01 MED ORDER — LORAZEPAM 0.5 MG PO TABS
0.5000 mg | ORAL_TABLET | Freq: Two times a day (BID) | ORAL | Status: DC | PRN
  Administered 2014-04-01 – 2014-04-02 (×2): 0.5 mg via ORAL
  Filled 2014-04-01 (×3): qty 1

## 2014-04-01 MED ORDER — POTASSIUM CHLORIDE CRYS ER 20 MEQ PO TBCR
40.0000 meq | EXTENDED_RELEASE_TABLET | Freq: Once | ORAL | Status: AC
  Administered 2014-04-01: 40 meq via ORAL
  Filled 2014-04-01: qty 2

## 2014-04-01 NOTE — Progress Notes (Signed)
Patient Name: Leslie [name] Date of Encounter: 04/01/2014  Principal Problem:   SIRS (systemic inflammatory response syndrome) Active Problems:   Hypothyroidism   Hypoxia   CKD (chronic kidney disease): baseline cr 1.3-1.5   Hypertensive emergency   Elevated troponin   Metabolic acidosis   Acute-on-chronic kidney injury   Length of Stay: 5  SUBJECTIVE  " I feel very tired" is her major complaint. Never had angina. Does have mild exertional dyspnea.  CURRENT MEDS . aspirin EC  81 mg Oral Daily  . atorvastatin  40 mg Oral q1800  . feeding supplement (RESOURCE BREEZE)  1 Container Oral BID BM  . furosemide  40 mg Oral Daily  . heparin subcutaneous  5,000 Units Subcutaneous 3 times per day  . hydrALAZINE  25 mg Oral TID  . levothyroxine  50 mcg Oral QAC breakfast  . montelukast  10 mg Oral QPM  . NIFEdipine  90 mg Oral QHS  . sertraline  25 mg Oral Daily  . sodium chloride  3 mL Intravenous Q12H    OBJECTIVE   Intake/Output Summary (Last 24 hours) at 04/01/14 1156 Last data filed at 04/01/14 0900  Gross per 24 hour  Intake    840 ml  Output    300 ml  Net    540 ml   Filed Weights   03/30/14 0351 03/31/14 0500 03/31/14 2043  Weight: 118 lb 9.7 oz (53.8 kg) 128 lb 8.5 oz (58.3 kg) 123 lb 14.4 oz (56.2 kg)    PHYSICAL EXAM Filed Vitals:   03/31/14 1749 03/31/14 2043 04/01/14 0449 04/01/14 1053  BP: 126/67 153/67 143/61 134/69  Pulse: 87 90 84 86  Temp: 98.8 F (37.1 C) 97.5 F (36.4 C) 97.7 F (36.5 C) 97.6 F (36.4 C)  TempSrc: Oral Oral Oral Oral  Resp: 21 18 18 20   Height:      Weight:  123 lb 14.4 oz (56.2 kg)    SpO2: 91% 93% 93% 92%   General: Alert, oriented x3, no distress; very thin and frail Head: no evidence of trauma, PERRL, EOMI, no exophtalmos or lid lag, no myxedema, no xanthelasma; normal ears, nose and oropharynx Neck: normal jugular venous pulsations and no hepatojugular reflux; brisk carotid pulses without delay and no carotid  bruits Chest: clear to auscultation, no signs of consolidation by percussion or palpation, normal fremitus, symmetrical and full respiratory excursions Cardiovascular: normal position and quality of the apical impulse, regular rhythm, normal first and second heart sounds, no rubs or gallops, no murmur Abdomen: no tenderness or distention, no masses by palpation, no abnormal pulsatility or arterial bruits, normal bowel sounds, no hepatosplenomegaly Extremities: no clubbing, cyanosis or edema; 2+ radial, ulnar and brachial pulses bilaterally; 2+ right femoral, posterior tibial and dorsalis pedis pulses; 2+ left femoral, posterior tibial and dorsalis pedis pulses; no subclavian or femoral bruits Neurological: grossly nonfocal  LABS  CBC  Recent Labs  03/31/14 0313 04/01/14 0002  WBC 16.7* 13.5*  NEUTROABS 12.5*  --   HGB 12.6 12.4  HCT 38.4 38.0  MCV 87.7 88.2  PLT 340 321   Basic Metabolic Panel  Recent Labs  03/31/14 0313 04/01/14 0002  NA 132* 131*  K 3.4* 3.5*  CL 94* 94*  CO2 17* 18*  GLUCOSE 111* 97  BUN 29* 32*  CREATININE 1.96* 2.06*  CALCIUM 8.7 8.4   Liver Function Tests No results found for this basename: AST, ALT, ALKPHOS, BILITOT, PROT, ALBUMIN,  in the last 72 hours No  results found for this basename: LIPASE, AMYLASE,  in the last 72 hours Cardiac Enzymes  Recent Labs  03/31/14 0313  CKTOTAL 296*   Radiology Studies Imaging results have been reviewed and No results found.   ASSESSMENT AND PLAN Small NSTEMI, probably "demand" ischemia due to noncardiac illness. No clinical evidence of hypervolemia, preserved LVEF. On ASA, statin and BP meds. Not on beta blocker due to second degree AV block. Not on ACE i due to acute renal failure. No new recommendations.  Advanced age, numerous comorbid conditions and overall frailty preclude aggressive coronary disease management.   Thurmon FairMihai Salinda Snedeker, MD, [facility] Healthcare[facility] [phone] office [phone]  pager 04/01/2014 11:56 AM

## 2014-04-01 NOTE — Progress Notes (Signed)
NUTRITION FOLLOW-UP  DOCUMENTATION CODES Per approved criteria  -Severe malnutrition in the context of chronic illness   INTERVENTION: Downgrade to Dysphagia 2 diet. Discontinue Ensure Complete PO BID. Add Resource Breeze po BID, each supplement provides 250 kcal and 9 grams of protein. RD to continue to follow nutrition care plan.  NUTRITION DIAGNOSIS: Inadequate oral intake related to poor appetite as evidenced by poor intake of meals. Ongoing.  Goal: Intake to meet >90% of estimated nutrition needs.  Monitor:  PO intake, labs, weight trend, supplement tolerance  ASSESSMENT: 80 yo woman with PMH of Renal mass, CKD, HTN, Hypothyroidism, recent UTI, HLD, PD, bradycardia who presented to the hospital after a fall. Work-up reveals SIRS and HTN emergency.  Per chart, daughter is requesting a palliative care consult for discharge planning.  Continues on a Soft Diet at this time. Per chart, intake is variable, however most recently pt has been eating 50-75% of meals. Currently ordered for Ensure Complete PO BID. Patient states that she doesn't like Ensure. She is willing to try [employer] Breeze. Based on foods that she has been able to tolerate in the hospital, she is gravitating to mostly pureed foods. Will downgrade diet.  Nutrition Focused Physical Exam:  Subcutaneous Fat:  Orbital Region: moderate depletion Upper Arm Region: severe depletion Thoracic and Lumbar Region: severe depletion  Muscle:  Temple Region: moderate depletion Clavicle Bone Region: moderate depletion Clavicle and Acromion Bone Region: n/a Scapular Bone Region: n/a Dorsal Hand: severe depletion Patellar Region: moderate depletion Anterior Thigh Region: moderate depletion Posterior Calf Region: severe depletion  Edema: none  Pt meets criteria for severe MALNUTRITION in the context of chronic ilnless as evidenced by severe fat and muscle wasting, intake <75% x at least 1 month.  Sodium low at  131 Potassium low at 3.5  Height: Ht Readings from Last 1 Encounters:  03/27/14 5' (1.524 m)    Weight: Wt Readings from Last 1 Encounters:  03/31/14 123 lb 14.4 oz (56.2 kg)  Admit wt 124 lb - stable  BMI:  Body mass index is 24.2 kg/(m^2). WNL  Estimated Nutritional Needs: Kcal: 1250-1450 Protein: 75-85 gm Fluid: 1.5 L  Skin: intact  Diet Order: Parke SimmersBland   Intake/Output Summary (Last 24 hours) at 04/01/14 1040 Last data filed at 03/31/14 2046  Gross per 24 hour  Intake    600 ml  Output    300 ml  Net    300 ml    Last BM: 7/8  Labs:   Recent Labs Lab 03/30/14 0515 03/31/14 0313 04/01/14 0002  NA 139 132* 131*  K 4.1 3.4* 3.5*  CL 105 94* 94*  CO2 17* 17* 18*  BUN 31* 29* 32*  CREATININE 1.84* 1.96* 2.06*  CALCIUM 8.4 8.7 8.4  GLUCOSE 121* 111* 97    CBG (last 3)  No results found for this basename: GLUCAP,  in the last 72 hours  Scheduled Meds: . aspirin EC  81 mg Oral Daily  . atorvastatin  40 mg Oral q1800  . feeding supplement (ENSURE COMPLETE)  237 mL Oral BID BM  . furosemide  40 mg Oral Daily  . heparin subcutaneous  5,000 Units Subcutaneous 3 times per day  . hydrALAZINE  25 mg Oral TID  . levothyroxine  50 mcg Oral QAC breakfast  . montelukast  10 mg Oral QPM  . NIFEdipine  90 mg Oral QHS  . potassium chloride  40 mEq Oral Once  . sertraline  25 mg Oral Daily  .  sodium chloride  3 mL Intravenous Q12H    Continuous Infusions: . sodium chloride Stopped (03/30/14 0600)    Jarold Motto MS, RD, LDN Inpatient Registered Dietitian Pager: [phone] After-hours pager: [phone]

## 2014-04-01 NOTE — Progress Notes (Signed)
i had discussed with Dr Clayborne DanaPatti over the phone earlier. She was concerned if patient was declining and may need hospice. i had assured her that she was more stable at this time and goals of care can be discussed depending upon how she progresses during the hospital stay. Her daughter came to visit her this afternoon and requested palliative care to be consulted to discuss goals of care. Consult placed.

## 2014-04-01 NOTE — Plan of Care (Signed)
Problem: Phase III Progression Outcomes Goal: Activity at appropriate level-compared to baseline (UP IN CHAIR FOR HEMODIALYSIS)  Outcome: Progressing Deconditioned. Will go to SNF rehab.  Goal: Voiding independently Outcome: Completed/Met Date Met:  04/01/14 Pt is incontinent. Goal: Discharge plan remains appropriate-arrangements made Outcome: Completed/Met Date Met:  04/01/14 [facility] upon discharge. Daughter requested for palliative consult.

## 2014-04-01 NOTE — Progress Notes (Addendum)
[facility] PROGRESS NOTE  Leslie [name] BJY:[id number] DOB: September 13, 1922 DOA: 03/27/2014 PCP: Ginette Otto, MD   Brief narrative 80 year old female with history of hypertension, chronic kidney disease, hypothyroidism, hyperlipidemia, history of renal mass, bradycardia and recent UTI presented to the ED on 03/27/2014 after a fall. She was found to be more confused and unable to take care of her self her last 1 week as her family. She was also having diarrhea (which is chronic) and completed a course of antibiotic for her UTI recently. In the ED she was found to have markedly elevated blood pressure, low-grade fever tachycardia and elevated white count. She was also hypoxic. Chest x-ray and UA were unremarkable. She had a mildly elevated troponin of 0.38 without any EKG changes. Patient admitted to stay down unit for SIRS . However within 24 hours she had markedly elevated troponin peaked at 4.69 with new T-wave inversion on lead I,II anterolateral leads. She was started on Flagyl empirically for diarrhea. The next morning lab showed acute kidney EGD with creatinine of 0.36. Stool for C. difficile was negative.   Assessment/Plan: SIRS No clear source of infection and patient remains afebrile. Leukocytosis improving. Cultures negative. Stool for C. differential and chest x-ray has been unremarkable.  Hypertensive emergency Present on admission requiring a labetalol drip. Now resolved. Patient is on nifedipine at home which has been resumed. She is also receiving IV Lasix for up to diastolic dysfunction. Blood pressure has been currently stable.  Acute on chronic kidney injury  with metabolic acidosis and rhabdomyolysis The patient presented with serum creatinine of 1.71 which had worsened to 2.36 with BUN of 36. Baseline renal function 3 months back showed creatinine of 1.6. Patient also had elevated CPK. Unclear if this is contributed by fall and dehydration  also had an elevated lactic  acid on presentation. Patient started on IV Lasix with findings all acute diastolic dysfunction. Has good urine output appears clinically euvolemic. Given persistent acute kidney injury with metabolic acidosis I would reduce her Lasix dose to 40 mg once daily. (Patient is not on a diuretic at home.) -Monitor renal function closely. Creatinine increased to 2.06 this morning. She has an anion gap of 19. We'll repeat CPK and lactic acid level.  NSTEMI Troponin peaked at 4.69. Patient does not have any chest pain symptoms. Seen by cardiology consult and suspect an LAD lesion but given her advanced age and poor renal function and patient also does not wish to pursue cardiac cath the focus has been on medical management. 2-D echo shows grade 2 diastolic dysfunction with preserved EF and new apical/distal wall akinesia. Continue aspirin and Lipitor. Hold statin if CPK is elevated. Avoiding beta blocker as patient has intermittent Mobitz type I on telemetry.    Acute diastolic dysfunction Chest x-ray on 7/6 concomitant with diffuse pulmonary edema . Patient diuresed with IV Lasix 40 mg twice a day. Patient is euvolemic at this time and given worsening renal function and persistent metabolic acidosis would reduce dose to 40 mg by mouth once daily.  Hypokalemia replenished  Hypothyroidism Continue Synthroid. TSH normal.  Acute Diarrhea Currently stable. C. difficile negative.  Weakness, deconditioning and malnutrition Seen by physical therapy and recommended skilled nursing facility. Social work aware and has sent referral to [facility] Will obtain nutrition consult.   Diet: Cardiac  DVT prophylaxis will subcutaneous heparin.  Code Status: DO NOT RESUSCITATE Family Communication: Spoke to daughter [name] over the phone Disposition Plan: Skilled nursing facility once renal function and  metabolic acidosis improved.   Consultants:  Cardiology  Procedures:  None  Antibiotics:  IV  vancomycin and Flagyl (stopped)  HPI/Subjective: Patient seen and examined this morning. Denies any significant symptoms except for feeling very weak  Objective: Filed Vitals:   04/01/14 0449  BP: 143/61  Pulse: 84  Temp: 97.7 F (36.5 C)  Resp: 18    Intake/Output Summary (Last 24 hours) at 04/01/14 0909 Last data filed at 03/31/14 2046  Gross per 24 hour  Intake    780 ml  Output    300 ml  Net    480 ml   Filed Weights   03/30/14 0351 03/31/14 0500 03/31/14 2043  Weight: 53.8 kg (118 lb 9.7 oz) 58.3 kg (128 lb 8.5 oz) 56.2 kg (123 lb 14.4 oz)    Exam:   General:  A thin built female in no acute distress  HEENT: No pallor, moist oral mucosa  Chest: Bilateral equal air entry, fine bibasilar crackles  Cardiovascular: Normal S1 and S2, no murmurs or gallop  Abdomen: Soft, nontender, nondistended, bowel sounds present  Charities: Warm, no edema  CNS: Alert and awake, oriented  Data Reviewed: Basic Metabolic Panel:  Recent Labs Lab 03/28/14 1142 03/30/14 0027 03/30/14 0515 03/31/14 0313 04/01/14 0002  NA 141 137 139 132* 131*  K 4.4 7.4* 4.1 3.4* 3.5*  CL 103 106 105 94* 94*  CO2 23 12* 17* 17* 18*  GLUCOSE 98 106* 121* 111* 97  BUN 37* 32* 31* 29* 32*  CREATININE 2.27* 1.91* 1.84* 1.96* 2.06*  CALCIUM 8.5 8.3* 8.4 8.7 8.4   Liver Function Tests:  Recent Labs Lab 03/27/14 1501 03/28/14 0655 03/28/14 1142  AST 23 44* 48*  ALT 14 14 15   ALKPHOS 117 73 78  BILITOT 0.4 0.5 0.6  PROT 8.2 6.6 7.2  ALBUMIN 3.7 2.8* 3.1*   No results found for this basename: LIPASE, AMYLASE,  in the last 168 hours No results found for this basename: AMMONIA,  in the last 168 hours CBC:  Recent Labs Lab 03/27/14 1501  03/28/14 0655 03/28/14 2115 03/30/14 0240 03/31/14 0313 04/01/14 0002  WBC 23.7*  < > 25.6* 22.1* 22.0* 16.7* 13.5*  NEUTROABS 21.2*  --   --   --   --  12.5*  --   HGB 15.5*  < > 12.9 12.3 12.0 12.6 12.4  HCT 46.9*  < > 39.9 38.0 37.3 38.4  38.0  MCV 90.0  < > 88.1 88.4 88.6 87.7 88.2  PLT 336  < > 249 284 338 340 321  < > = values in this interval not displayed. Cardiac Enzymes:  Recent Labs Lab 03/27/14 1501 03/27/14 1930 03/28/14 0040 03/28/14 0655 03/28/14 1142 03/31/14 0313  CKTOTAL 85 203* 382* 1124* 1122* 296*  TROPONINI 0.38* 3.46* 3.56* 4.53*  4.69* 3.75*  --    BNP (last 3 results)  Recent Labs  12/18/13 1846 03/27/14 1501  PROBNP 2335.0* 14898.0*   CBG: No results found for this basename: GLUCAP,  in the last 168 hours  Recent Results (from the past 240 hour(s))  CULTURE, BLOOD (ROUTINE X 2)     Status: None   Collection Time    03/27/14  4:45 PM      Result Value Ref Range Status   Specimen Description BLOOD LEFT ANTECUBITAL   Final   Special Requests BOTTLES DRAWN AEROBIC AND ANAEROBIC 6CC EA   Final   Culture  Setup Time     Final  Value: 03/28/2014 02:10     Performed at [facility]   Culture     Final   Value:        BLOOD CULTURE RECEIVED NO GROWTH TO DATE CULTURE WILL BE HELD FOR 5 DAYS BEFORE ISSUING A FINAL NEGATIVE REPORT     Performed at [facility]   Report Status PENDING   Incomplete  CULTURE, BLOOD (ROUTINE X 2)     Status: None   Collection Time    03/27/14  5:24 PM      Result Value Ref Range Status   Specimen Description BLOOD RIGHT HAND   Final   Special Requests BOTTLES DRAWN AEROBIC ONLY 5CC   Final   Culture  Setup Time     Final   Value: 03/28/2014 02:10     Performed at [facility]   Culture     Final   Value:        BLOOD CULTURE RECEIVED NO GROWTH TO DATE CULTURE WILL BE HELD FOR 5 DAYS BEFORE ISSUING A FINAL NEGATIVE REPORT     Performed at [facility]   Report Status PENDING   Incomplete  MRSA PCR SCREENING     Status: None   Collection Time    03/27/14  6:08 PM      Result Value Ref Range Status   MRSA by PCR NEGATIVE  NEGATIVE Final   Comment:            The GeneXpert MRSA Assay (FDA     approved for NASAL  specimens     only), is one component of a     comprehensive MRSA colonization     surveillance program. It is not     intended to diagnose MRSA     infection nor to guide or     monitor treatment for     MRSA infections.  CLOSTRIDIUM DIFFICILE BY PCR     Status: None   Collection Time    03/28/14  5:17 PM      Result Value Ref Range Status   C difficile by pcr NEGATIVE  NEGATIVE Final     Studies: No results found.  Scheduled Meds: . aspirin EC  81 mg Oral Daily  . atorvastatin  40 mg Oral q1800  . feeding supplement (ENSURE COMPLETE)  237 mL Oral BID BM  . furosemide  40 mg Oral Daily  . heparin subcutaneous  5,000 Units Subcutaneous 3 times per day  . hydrALAZINE  25 mg Oral TID  . levothyroxine  50 mcg Oral QAC breakfast  . montelukast  10 mg Oral QPM  . NIFEdipine  90 mg Oral QHS  . potassium chloride  40 mEq Oral Once  . sertraline  25 mg Oral Daily  . sodium chloride  3 mL Intravenous Q12H   Continuous Infusions: . sodium chloride Stopped (03/30/14 0600)      Time spent: 25 minutes    Daruis Swaim  [facility] Pager [phone] If 7PM-7AM, please contact night-coverage at [email or web address], password [email or web address] 04/01/2014, 9:09 AM  LOS: 5 days

## 2014-04-02 DIAGNOSIS — Z515 Encounter for palliative care: Secondary | ICD-10-CM

## 2014-04-02 DIAGNOSIS — K140 Glossitis: Secondary | ICD-10-CM

## 2014-04-02 DIAGNOSIS — T796XXA Traumatic ischemia of muscle, initial encounter: Secondary | ICD-10-CM

## 2014-04-02 LAB — BASIC METABOLIC PANEL
Anion gap: 18 — ABNORMAL HIGH (ref 5–15)
BUN: 30 mg/dL — ABNORMAL HIGH (ref 6–23)
CALCIUM: 8.1 mg/dL — AB (ref 8.4–10.5)
CO2: 17 mEq/L — ABNORMAL LOW (ref 19–32)
Chloride: 97 mEq/L (ref 96–112)
Creatinine, Ser: 1.81 mg/dL — ABNORMAL HIGH (ref 0.50–1.10)
GFR, EST AFRICAN AMERICAN: 27 mL/min — AB (ref >90)
GFR, EST NON AFRICAN AMERICAN: 23 mL/min — AB (ref >90)
GLUCOSE: 95 mg/dL (ref 70–99)
Potassium: 4.9 mEq/L (ref 3.7–5.3)
Sodium: 132 mEq/L — ABNORMAL LOW (ref 137–147)

## 2014-04-02 LAB — CBC
HCT: 39 % (ref 36.0–46.0)
Hemoglobin: 12.7 g/dL (ref 12.0–15.0)
MCH: 29.1 pg (ref 26.0–34.0)
MCHC: 32.6 g/dL (ref 30.0–36.0)
MCV: 89.4 fL (ref 78.0–100.0)
Platelets: 343 10*3/uL (ref 150–400)
RBC: 4.36 MIL/uL (ref 3.87–5.11)
RDW: 16.1 % — AB (ref 11.5–15.5)
WBC: 13.5 10*3/uL — ABNORMAL HIGH (ref 4.0–10.5)

## 2014-04-02 MED ORDER — SODIUM BICARBONATE 650 MG PO TABS
1300.0000 mg | ORAL_TABLET | Freq: Every day | ORAL | Status: DC
  Administered 2014-04-02: 1300 mg via ORAL
  Filled 2014-04-02 (×2): qty 2

## 2014-04-02 MED ORDER — TRAZODONE HCL 50 MG PO TABS
50.0000 mg | ORAL_TABLET | Freq: Every day | ORAL | Status: DC
  Administered 2014-04-02: 50 mg via ORAL
  Filled 2014-04-02 (×2): qty 1

## 2014-04-02 MED ORDER — MAGIC MOUTHWASH
5.0000 mL | Freq: Three times a day (TID) | ORAL | Status: DC
  Administered 2014-04-02 – 2014-04-05 (×10): 5 mL via ORAL
  Filled 2014-04-02 (×12): qty 5

## 2014-04-02 MED ORDER — LIDOCAINE VISCOUS 2 % MT SOLN
15.0000 mL | OROMUCOSAL | Status: DC | PRN
  Administered 2014-04-02 (×2): 15 mL via OROMUCOSAL
  Filled 2014-04-02 (×2): qty 15

## 2014-04-02 NOTE — Progress Notes (Signed)
Patient [name]:Leslie [name] Kuechle      DOB: [birth year]      ZHY:[id number]  Left voice message for patient's daughter Clayborne Danaatti, and spoke with her spouse this am . Patient not available and will call as soon as possible to schedule a time for goals of care.  Aziyah Provencal L. Ladona Ridgelaylor, MD MBA The Palliative Medicine Team at [facility] Team Phone: [phone] Pager: [phone]

## 2014-04-02 NOTE — Progress Notes (Signed)
Discussed with Dr. Royann Shiversroitoru. No new recommendation by cardiology. Her elevated troponin likely related to demand ischemia, given her advanced age and comorbidities, not a candidate for invasive therapy. I have left message with [facility] to schedule follow up post discharge. Cardiology will sign off.  Please contact us if have any further questions.  Ramond DialSigned, Sherise Geerdes PA Pager: [phone]

## 2014-04-02 NOTE — Clinical Social Work Note (Signed)
CSW continues to follow patient for d/c planning needs. CSW made aware of patient and family request for SNF with palliative care vs. Hospice, with preference for [facility] as patient has been there before. CSW contacted [facility] and spoke with Nira Conn [phone]) who informed CSW that patient will have room at facility. Heather requested CSW provide weekend d/c disposition. CSW met with patient, patient's daughter Precious Bard [phone]) HCPOA, and son-in-law Donnie [phone]) and discussed d/c plan. CSW made family aware [facility] has a bed for patient at facility. Patient and daughter expressed relief with patient being able to return to facility. Patient's daughter requested she or husband be updated with weekend d/c disposition.RN made aware. CSW to continue to follow for weekend d/c planning needs.   [place], [place] Weekend Clinical Social Worker [phone]

## 2014-04-02 NOTE — Consult Note (Addendum)
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 Was found down at home .  She was diagnosed with NSTEMI, hypetensive emergency and SIRs of unclear etiology.  Patient has expressed that she does not want intensive interventions or studies.  She does want to focus on general comfort. She is open to SNF and realizes that she can't go back to her independent living setting. Today she revealed intense pain in her tongue, and a feeling of general malaise and general muscle aches which are likely reflective of her rhabdomyolysis.   Goals of Care: 1.  Code Status: DNR affirmed   2. Scope of Treatment: Continue to treat the treatable with intent to transition to rehab.  Leslie [name] was in pain and agreed to talk with me more tomorrow about MOST form.    4. Disposition: SNF likely [facility]   3. Symptom Management:   1. Anxiety/Agitation: prn ativan ordered 2. Pain: in tongue, ordered magic mouthwash, and topical lidocaine will continue to observed as possible source for infection.  I suspect she may have traumatized it when she collapsed. 3. Bowel Regimen: chronic diarrhea possibly due to carcinoid (unconfirmed) 4. Glossitis no evidence for overt thrush, possible trauma: magic mouthwash, topical lidocaine 5. Muscle pain secondary to rhabdo: prn tylenol, gentle hydration  6. CKD with acidosis and elevated creatinine: starting bicarb monitor levels as patient still desires to treat reasonable things with comfort.  4. 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  Daughter Clayborne Danaatti is very  dedicated  5. Spiritual:  Will be offered as needed.        Patient Documents Completed or Given: Document Given Completed  Advanced Directives Pkt    MOST    DNR    Gone from My Sight    Hard Choices      Brief HPI: 80 yr old white female with known renal mass, per primary MD suspicious for carcinoid.  Patient was found down at home.  Appears to have had MI.  We were asked to assist with goals of care.   ROS: tongue pain, bodyaches.    PMH:  Past Medical History  Diagnosis Date  . Hypertension   . Bowel obstruction 1987  . Hypothyroidism   . Interstitial cystitis   . Hypercholesteremia   . Headache(784.0)   . Anxiety   . Depression   . Multiple respiratory allergies   . Restless leg syndrome   . Stroke 06/2002    right parietal nonhemorrhagic acute infarction  . Abdominal mass   . Fibromyalgia   . Parkinson's disease     dx'd 1990's; told she doesn't have it ~ 2000  . Bradycardia, sinus   . CKD (chronic kidney disease): baseline cr 1.3-1.5 12/18/2013     PSH: Past Surgical History  Procedure Laterality Date  . Abdominal surgery  1970's    "for blockage"  . Cystoscopy with urethral dilatation  02/2006; 07/2005; 01/2005  . Cholecystectomy    . Tonsillectomy    . Appendectomy    .  Abdominal hysterectomy    . Dilation and curettage of uterus  1940's  . Shoulder surgery  1970's    "for fx"   I have reviewed the FH and SH and  If appropriate update it with new information. Allergies  Allergen Reactions  . Compazine     hives  . Penicillins     rash  . Sulfa Antibiotics     rash  . Iohexol Rash    Patient states last time she had "CT contrast" she got a rash.   Scheduled Meds: . aspirin EC  81 mg Oral Daily  . atorvastatin  40 mg Oral q1800  . feeding supplement (RESOURCE BREEZE)  1 Container Oral BID BM  . furosemide  40 mg Oral Daily  . heparin subcutaneous  5,000 Units Subcutaneous 3 times per day  . hydrALAZINE  25 mg Oral TID  .  levothyroxine  50 mcg Oral QAC breakfast  . magic mouthwash  5 mL Oral TID  . montelukast  10 mg Oral QPM  . NIFEdipine  90 mg Oral QHS  . sertraline  25 mg Oral Daily  . sodium bicarbonate  1,300 mg Oral Daily  . sodium chloride  3 mL Intravenous Q12H  . traZODone  50 mg Oral QHS   Continuous Infusions: . sodium chloride Stopped (03/30/14 0600)   PRN Meds:.acetaminophen, acetaminophen, bisacodyl, diphenhydrAMINE, HYDROcodone-acetaminophen, lidocaine, LORazepam, ondansetron (ZOFRAN) IV, ondansetron    BP 134/61  Pulse 91  Temp(Src) 97.8 F (36.6 C) (Oral)  Resp 19  Ht 5' (1.524 m)  Wt 54.7 kg (120 lb 9.5 oz)  BMI 23.55 kg/m2  SpO2 91%   PPS: 30-40 %   Intake/Output Summary (Last 24 hours) at 04/02/14 1353 Last data filed at 04/02/14 1610  Gross per 24 hour  Intake    540 ml  Output      0 ml  Net    540 ml    Physical Exam:  General: Awake , alert oriented with capacity for decision making,  Intermittent pain related to tongue HEENT:  PERRL, EOMI, edentulous no overt thrush but tongue looks glossy and may have an area of trauma on the left side. Chest:  Decreased, but clear, no wheezing CVS: Regular, distant ,  Abdomen: soft, not tender, positive bowel sounds Ext: pale, warm, no edema or mottling Neuro:oriented to time place and person,  Capacity intact for decision making.  Labs: CBC    Component Value Date/Time   WBC 13.5* 04/02/2014 0617   RBC 4.36 04/02/2014 0617   HGB 12.7 04/02/2014 0617   HCT 39.0 04/02/2014 0617   PLT 343 04/02/2014 0617   MCV 89.4 04/02/2014 0617   MCH 29.1 04/02/2014 0617   MCHC 32.6 04/02/2014 0617   RDW 16.1* 04/02/2014 0617   LYMPHSABS 2.7 03/31/2014 0313   MONOABS 1.5* 03/31/2014 0313   EOSABS 0.0 03/31/2014 0313   BASOSABS 0.0 03/31/2014 0313     CMP     Component Value Date/Time   NA 132* 04/02/2014 0617   K 4.9 04/02/2014 0617   CL 97 04/02/2014 0617   CO2 17* 04/02/2014 0617   GLUCOSE 95 04/02/2014 0617   BUN 30* 04/02/2014 0617    CREATININE 1.81* 04/02/2014 0617   CALCIUM 8.1* 04/02/2014 0617   PROT 7.2 03/28/2014 1142   ALBUMIN 3.1* 03/28/2014 1142   AST 48* 03/28/2014 1142   ALT 15 03/28/2014 1142   ALKPHOS 78 03/28/2014 1142   BILITOT 0.6 03/28/2014 1142   GFRNONAA  23* 04/02/2014 0617   GFRAA 27* 04/02/2014 0617    Chest Xray Reviewed/Impressions: Increased diffuse pulmonary edema. Stable mild cardiomegaly. Cannot  exclude small bilateral pleural effusions   CT scan of the Head Reviewed/Impressions: 1. Stable age related cerebral atrophy, ventriculomegaly and  periventricular white matter disease.  2. No acute intracranial findings or skull fracture.  3. Degenerative cervical spondylosis but normal alignment and no  acute cervical spine fracture   Discussed with Dr. Pete Glatter and Dr. Gwenlyn Perking  Time In Time Out Total Time Spent with Patient Total Overall Time  1100 am 1200 pm 30 min 60 min    Greater than 50%  of this time was spent counseling and coordinating care related to the above assessment and plan.  Maccoy Haubner L. Ladona Ridgel, MD MBA The Palliative Medicine Team at [facility] Phone: [phone] Pager: [phone]

## 2014-04-02 NOTE — Progress Notes (Signed)
[facility] PROGRESS NOTE  Leslie [name] ZOX:[id number] DOB: [birth year] DOA: 03/27/2014 PCP: Ginette Otto, MD  Assessment/Plan: SIRS -No clear source of infection identified -patient remains stable and afebrile -leukocytosis trending dow (most likely demargination from rhabdomyolysis) -Cultures negative. Stool for C. differential and chest x-ray unremarkable.  Hypertensive emergency -Present on admission requiring a labetalol drip. Now resolved.  -BP stable with nifedipine, hydralazine and lasix -will monitor and adjust meds as needed  Acute on chronic kidney injury  with metabolic acidosis and rhabdomyolysis -The patient presented with serum creatinine of 1.71 which had worsened to 2.36 with BUN of 36. Baseline renal function 3 months back showed creatinine of 1.6. Patient also had elevated CPK. Most likely from muscle trauma after falling and decrease PO intake. Patient also with acute on chronic diastolic heart failure and was on IV lasix. -kidney function now improving and trending down -Cr 1.8 -will add sodium bicarbonate tablets to help with acidosis  NSTEMI -Troponin peaked at 4.69. Patient does not have any chest pain symptoms. Seen by cardiology. -as per recommendations continue medical management -no interventions -2-D echo shows grade 2 diastolic dysfunction with preserved EF and new apical/distal wall akinesia. Continue aspirin and Lipitor.  -Avoiding beta blocker as patient has intermittent Mobitz type I  -d/c telemetry  Acute on chronic diastolic dysfunction -Chest x-ray on 7/6 concomitant with diffuse pulmonary edema. Patient was diuresed with IV Lasix 40 mg twice a day until 7/9 -Patient is now euvolemic and on exam no crackles, no JVD and good air movement/O2 sat. -continue lasix 40mg  PO daily -continue daily weight and Strict I's and O's  Hypokalemia -most likely from decrease PO intake and ongoing diarrhea -repleted and WNL now -will  monitor  Hypothyroidism -Continue Synthroid. TSH normal.  Acute Diarrhea: with hx of chronic intermittent episodes -Currently stable. C. difficile negative. -will replete electrolytes as needed and encourage increase PO intake to guarantee/maintain hydration  Weakness, deconditioning and malnutrition Seen by physical therapy and recommended skilled nursing facility. Social work aware and has sent referral to [facility] Waiting responds from facility. Also to be seen by palliative care. -breeze started for feeding supplemnts  Insomnia -will start trazodone QHS  Diet: dysphagia 2 diet with feeding supplements  DVT prophylaxis will subcutaneous heparin.  Code Status: DO NOT RESUSCITATE Family Communication: Spoke to daughter [name] over the phone; no family at bedside Disposition Plan: Skilled nursing facility (waiting on palliative care consult, facility availability and further improveemnts on renal function and acidosis)   Consultants:  Cardiology  Procedures:  None  Antibiotics:  Currently no on antibiotics (was on vanc and flagyl)  HPI/Subjective: Patient is afebrile; main complaint is insomnia. Denies CP or SOB.  Objective: Filed Vitals:   04/02/14 0903  BP: 134/61  Pulse: 91  Temp: 97.8 F (36.6 C)  Resp: 19    Intake/Output Summary (Last 24 hours) at 04/02/14 1138 Last data filed at 04/02/14 0904  Gross per 24 hour  Intake    540 ml  Output      0 ml  Net    540 ml   Filed Weights   03/31/14 0500 03/31/14 2043 04/01/14 2057  Weight: 58.3 kg (128 lb 8.5 oz) 56.2 kg (123 lb 14.4 oz) 54.7 kg (120 lb 9.5 oz)    Exam:   General: afebrile, frail; AAO to place, time and person, no acute distress. Main complain is been unable to sleep at night.  HEENT: No pallor, moist oral mucosa; no erythema or exudates  Chest: good air movement, no wheezing or crackles appreciated on exam  Cardiovascular: Normal S1 and S2, no murmurs or gallop; trace edema  bilaterally  Abdomen: Soft, nontender, nondistended, bowel sounds present  CNS: Alert and awake, oriented to place, time and person; move 4 limbs spontaneously and follows commands properly  Data Reviewed: Basic Metabolic Panel:  Recent Labs Lab 03/30/14 0027 03/30/14 0515 03/31/14 0313 04/01/14 0002 04/02/14 0617  NA 137 139 132* 131* 132*  K 7.4* 4.1 3.4* 3.5* 4.9  CL 106 105 94* 94* 97  CO2 12* 17* 17* 18* 17*  GLUCOSE 106* 121* 111* 97 95  BUN 32* 31* 29* 32* 30*  CREATININE 1.91* 1.84* 1.96* 2.06* 1.81*  CALCIUM 8.3* 8.4 8.7 8.4 8.1*   Liver Function Tests:  Recent Labs Lab 03/27/14 1501 03/28/14 0655 03/28/14 1142  AST 23 44* 48*  ALT 14 14 15   ALKPHOS 117 73 78  BILITOT 0.4 0.5 0.6  PROT 8.2 6.6 7.2  ALBUMIN 3.7 2.8* 3.1*   CBC:  Recent Labs Lab 03/27/14 1501  03/28/14 2115 03/30/14 0240 03/31/14 0313 04/01/14 0002 04/02/14 0617  WBC 23.7*  < > 22.1* 22.0* 16.7* 13.5* 13.5*  NEUTROABS 21.2*  --   --   --  12.5*  --   --   HGB 15.5*  < > 12.3 12.0 12.6 12.4 12.7  HCT 46.9*  < > 38.0 37.3 38.4 38.0 39.0  MCV 90.0  < > 88.4 88.6 87.7 88.2 89.4  PLT 336  < > 284 338 340 321 343  < > = values in this interval not displayed. Cardiac Enzymes:  Recent Labs Lab 03/27/14 1501 03/27/14 1930 03/28/14 0040 03/28/14 0655 03/28/14 1142 03/31/14 0313 04/01/14 1116  CKTOTAL 85 203* 382* 1124* 1122* 296* 168  TROPONINI 0.38* 3.46* 3.56* 4.53*  4.69* 3.75*  --   --    BNP (last 3 results)  Recent Labs  12/18/13 1846 03/27/14 1501  PROBNP 2335.0* 14898.0*    Recent Results (from the past 240 hour(s))  CULTURE, BLOOD (ROUTINE X 2)     Status: None   Collection Time    03/27/14  4:45 PM      Result Value Ref Range Status   Specimen Description BLOOD LEFT ANTECUBITAL   Final   Special Requests BOTTLES DRAWN AEROBIC AND ANAEROBIC 6CC EA   Final   Culture  Setup Time     Final   Value: 03/28/2014 02:10     Performed at [facility]    Culture     Final   Value:        BLOOD CULTURE RECEIVED NO GROWTH TO DATE CULTURE WILL BE HELD FOR 5 DAYS BEFORE ISSUING A FINAL NEGATIVE REPORT     Performed at [facility]   Report Status PENDING   Incomplete  CULTURE, BLOOD (ROUTINE X 2)     Status: None   Collection Time    03/27/14  5:24 PM      Result Value Ref Range Status   Specimen Description BLOOD RIGHT HAND   Final   Special Requests BOTTLES DRAWN AEROBIC ONLY 5CC   Final   Culture  Setup Time     Final   Value: 03/28/2014 02:10     Performed at [facility]   Culture     Final   Value:        BLOOD CULTURE RECEIVED NO GROWTH TO DATE CULTURE WILL BE HELD FOR 5 DAYS BEFORE  ISSUING A FINAL NEGATIVE REPORT     [facility]   Report Status PENDING   Incomplete  MRSA PCR SCREENING     Status: None   Collection Time    03/27/14  6:08 PM      Result Value Ref Range Status   MRSA by PCR NEGATIVE  NEGATIVE Final   Comment:            The GeneXpert MRSA Assay (FDA     approved for NASAL specimens     only), is one component of a     comprehensive MRSA colonization     surveillance program. It is not     intended to diagnose MRSA     infection nor to guide or     monitor treatment for     MRSA infections.  CLOSTRIDIUM DIFFICILE BY PCR     Status: None   Collection Time    03/28/14  5:17 PM      Result Value Ref Range Status   C difficile by pcr NEGATIVE  NEGATIVE Final     Studies: No results found.  Scheduled Meds: . aspirin EC  81 mg Oral Daily  . atorvastatin  40 mg Oral q1800  . feeding supplement (RESOURCE BREEZE)  1 Container Oral BID BM  . furosemide  40 mg Oral Daily  . heparin subcutaneous  5,000 Units Subcutaneous 3 times per day  . hydrALAZINE  25 mg Oral TID  . levothyroxine  50 mcg Oral QAC breakfast  . montelukast  10 mg Oral QPM  . NIFEdipine  90 mg Oral QHS  . sertraline  25 mg Oral Daily  . sodium bicarbonate  1,300 mg Oral Daily  . sodium chloride  3 mL  Intravenous Q12H   Continuous Infusions: . sodium chloride Stopped (03/30/14 0600)      Time spent: < 30 minutes    Vassie LollMadera, Lory Nowaczyk  [facility] Pager [phone] If 7PM-7AM, please contact night-coverage at [email or web address], password [email or web address] 04/02/2014, 11:38 AM  LOS: 6 days

## 2014-04-02 NOTE — Progress Notes (Signed)
Patient Name: Leslie [name] Date of Encounter: 04/02/2014     Principal Problem:   SIRS (systemic inflammatory response syndrome) Active Problems:   Hypothyroidism   Hypoxia   CKD (chronic kidney disease): baseline cr 1.3-1.5   Hypertensive emergency   Elevated troponin   Metabolic acidosis   Acute-on-chronic kidney injury    SUBJECTIVE  States she had a restless night, did not sleep very well last night, however denies any chest pain. Mild SOB unchanged. She is worried about her kidney.  CURRENT MEDS . aspirin EC  81 mg Oral Daily  . atorvastatin  40 mg Oral q1800  . feeding supplement (RESOURCE BREEZE)  1 Container Oral BID BM  . furosemide  40 mg Oral Daily  . heparin subcutaneous  5,000 Units Subcutaneous 3 times per day  . hydrALAZINE  25 mg Oral TID  . levothyroxine  50 mcg Oral QAC breakfast  . montelukast  10 mg Oral QPM  . NIFEdipine  90 mg Oral QHS  . sertraline  25 mg Oral Daily  . sodium bicarbonate  1,300 mg Oral Daily  . sodium chloride  3 mL Intravenous Q12H    OBJECTIVE  Filed Vitals:   04/01/14 1053 04/01/14 2057 04/02/14 0651 04/02/14 0903  BP: 134/69 148/55 136/53 134/61  Pulse: 86 90 81 91  Temp: 97.6 F (36.4 C) 97.8 F (36.6 C) 98.1 F (36.7 C) 97.8 F (36.6 C)  TempSrc: Oral Oral Oral Oral  Resp: 20 18 18 19   Height:      Weight:  120 lb 9.5 oz (54.7 kg)    SpO2: 92% 94% 95% 91%    Intake/Output Summary (Last 24 hours) at 04/02/14 1058 Last data filed at 04/02/14 0904  Gross per 24 hour  Intake    540 ml  Output      0 ml  Net    540 ml   Filed Weights   03/31/14 0500 03/31/14 2043 04/01/14 2057  Weight: 128 lb 8.5 oz (58.3 kg) 123 lb 14.4 oz (56.2 kg) 120 lb 9.5 oz (54.7 kg)    PHYSICAL EXAM  General: Pleasant, NAD. Neuro: Alert and oriented X 3. Moves all extremities spontaneously. Psych: Normal affect. HEENT:  Normal  Neck: Supple without bruits or JVD. Lungs:  Resp regular and unlabored, CTA. Decreased breath  sound in bilateral basis Heart: irregular. no s3, s4, or murmurs. Abdomen: Soft, non-tender, non-distended, BS + x 4.  Extremities: No clubbing, cyanosis or edema. DP/PT/Radials 2+ and equal bilaterally.  Accessory Clinical Findings  CBC  Recent Labs  03/31/14 0313 04/01/14 0002 04/02/14 0617  WBC 16.7* 13.5* 13.5*  NEUTROABS 12.5*  --   --   HGB 12.6 12.4 12.7  HCT 38.4 38.0 39.0  MCV 87.7 88.2 89.4  PLT 340 321 343   Basic Metabolic Panel  Recent Labs  04/01/14 0002 04/02/14 0617  NA 131* 132*  K 3.5* 4.9  CL 94* 97  CO2 18* 17*  GLUCOSE 97 95  BUN 32* 30*  CREATININE 2.06* 1.81*  CALCIUM 8.4 8.1*   Cardiac Enzymes  Recent Labs  03/31/14 0313 04/01/14 1116  CKTOTAL 296* 168    TELE  Not on tele  ECG  No recent EKG  Radiology/Studies  Dg Pelvis 1-2 Views  03/27/2014   CLINICAL DATA:  Status post fall  EXAM: PELVIS - 1-2 VIEW  COMPARISON:  None.  FINDINGS: The bony pelvis is osteopenic. There is mild symmetric narrowing of the hip joints. The  SI joints are intact. The observed portions of the sacrum are normal. There are degenerative changes of the lumbar spine.  IMPRESSION: There is no acute pelvic fracture.   Electronically Signed   By: David  [place]   On: 03/27/2014 16:16   Ct Head Wo Contrast  03/27/2014   CLINICAL DATA:  Larey Seat.  Unresponsive.  EXAM: CT HEAD WITHOUT CONTRAST  CT CERVICAL SPINE WITHOUT CONTRAST  TECHNIQUE: Multidetector CT imaging of the head and cervical spine was performed following the standard protocol without intravenous contrast. Multiplanar CT image reconstructions of the cervical spine were also generated.  COMPARISON:  Head CT 04/28/2012  FINDINGS: CT HEAD FINDINGS  Stable age related cerebral atrophy, ventriculomegaly and periventricular white matter disease. No extra-axial fluid collections are identified. No CT findings for acute hemispheric infarction or intracranial hemorrhage. No mass lesions. The brainstem and cerebellum are  normal.  No acute bony findings. No fracture or bone lesion. The paranasal sinuses and mastoid air cells are clear. The globes are intact.  CT CERVICAL SPINE FINDINGS  Degenerative cervical spondylosis with multilevel disc disease and facet disease. The alignment is maintained. No acute fracture or abnormal prevertebral soft tissue swelling. The skullbase C1 and C1-2 articulations are maintained. The dens is intact. Multilevel foraminal stenosis due to uncinate spurring and facet disease.  Carotid artery calcifications are noted bilaterally. No neck mass or adenopathy. The lung apices are grossly clear.  IMPRESSION: 1. Stable age related cerebral atrophy, ventriculomegaly and periventricular white matter disease. 2. No acute intracranial findings or skull fracture. 3. Degenerative cervical spondylosis but normal alignment and no acute cervical spine fracture.   Electronically Signed   By: Loralie Champagne M.D.   On: 03/27/2014 16:08   Ct Cervical Spine Wo Contrast  03/27/2014   CLINICAL DATA:  Larey Seat.  Unresponsive.  EXAM: CT HEAD WITHOUT CONTRAST  CT CERVICAL SPINE WITHOUT CONTRAST  TECHNIQUE: Multidetector CT imaging of the head and cervical spine was performed following the standard protocol without intravenous contrast. Multiplanar CT image reconstructions of the cervical spine were also generated.  COMPARISON:  Head CT 04/28/2012  FINDINGS: CT HEAD FINDINGS  Stable age related cerebral atrophy, ventriculomegaly and periventricular white matter disease. No extra-axial fluid collections are identified. No CT findings for acute hemispheric infarction or intracranial hemorrhage. No mass lesions. The brainstem and cerebellum are normal.  No acute bony findings. No fracture or bone lesion. The paranasal sinuses and mastoid air cells are clear. The globes are intact.  CT CERVICAL SPINE FINDINGS  Degenerative cervical spondylosis with multilevel disc disease and facet disease. The alignment is maintained. No acute  fracture or abnormal prevertebral soft tissue swelling. The skullbase C1 and C1-2 articulations are maintained. The dens is intact. Multilevel foraminal stenosis due to uncinate spurring and facet disease.  Carotid artery calcifications are noted bilaterally. No neck mass or adenopathy. The lung apices are grossly clear.  IMPRESSION: 1. Stable age related cerebral atrophy, ventriculomegaly and periventricular white matter disease. 2. No acute intracranial findings or skull fracture. 3. Degenerative cervical spondylosis but normal alignment and no acute cervical spine fracture.   Electronically Signed   By: Loralie Champagne M.D.   On: 03/27/2014 16:08   Dg Chest Port 1 View  03/29/2014   CLINICAL DATA:  Shortness of breath. Abnormal breath sounds. Systemic inflammatory response syndrome.  EXAM: PORTABLE CHEST - 1 VIEW  COMPARISON:  03/27/2014  FINDINGS: New diffuse bilateral airspace disease is seen, consistent with diffuse pulmonary edema. Elevation of right hemidiaphragm is  unchanged. Heart size is mildly largest stable. Small pleural effusions cannot be excluded.  IMPRESSION: Increased diffuse pulmonary edema. Stable mild cardiomegaly. Cannot exclude small bilateral pleural effusions.   Electronically Signed   By: Myles Rosenthal M.D.   On: 03/29/2014 23:46   Dg Chest Portable 1 View  03/27/2014   CLINICAL DATA:  Fall, cough/congestion  EXAM: PORTABLE CHEST - 1 VIEW  COMPARISON:  12/18/2013  FINDINGS: Chronic interstitial markings/ emphysematous changes. No focal consolidation. Mild elevation of the right hemidiaphragm. No pleural effusion or pneumothorax.  The heart is top-normal in size.  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  IMPRESSION: No evidence of acute cardiopulmonary disease.   Electronically Signed   By: Charline Bills M.D.   On: 03/27/2014 14:39    ASSESSMENT AND PLAN  1. Elevated troponin, likely demand ischemia  - Echo EF 50-55%, apical and distal  akinesis  - patient does not wish invasive procedure, medical management only  - no new recommendation, palliative care involved  2. Intermittent Mobitz 1   3. Acute on chronic renal insufficiency  4. HTN 5. SIRS-management per IM 6. Acute on chronic diastolic HF - continue 40mg  PO lasix    Signed, Azalee Course PA-C Pager: [phone]  I have seen and examined the patient along with Azalee Course PA-C.  I have reviewed the chart, notes and new data.  I agree with PA's note.  PLAN: Continue with conservative management for NSTEMI and mild CHF as outlined yesterday. Cardiology will be available for further questions over the weekend, if needed.  Thurmon Fair, MD, [facility] [phone] 04/02/2014, 12:34 PM

## 2014-04-02 NOTE — Progress Notes (Signed)
Physical Therapy Treatment Patient Details Name: Leslie NeatDorothy W Bozard MRN: [id number] DOB: 01-26-1922 Today's Date: 04/02/2014    History of Present Illness Pt is a 80 y/o female admitted s/p increased lethargy and fall at home. Great-grandson found pt lying face-up next to her bed the morning of admission, and was not responsive until EMS arrived. In the ED, pt was found to have had a NSTEMI.    PT Comments    Pt. Appears fatigued.  Had been sitting in recliner before PT session.  Was saturated with urine.  Nursing tech MurphyMarial states pt. Has had several episodes of urinary incontinence today due to lasix.  Pt. Tolerated transfers to 3n1 then for personal care before back to bed.    Follow Up Recommendations  SNF;Supervision/Assistance - 24 hour     Equipment Recommendations  Rolling walker with 5" wheels;3in1 (PT)    Recommendations for Other Services       Precautions / Restrictions Precautions Precautions: Fall Restrictions Weight Bearing Restrictions: No    Mobility  Bed Mobility Overal bed mobility: Needs Assistance Bed Mobility: Sit to Supine     Supine to sit: Mod assist     General bed mobility comments: mod assist for LEs back into bed and for positioning of hips  in the middle of the bed  Transfers Overall transfer level: Needs assistance   Transfers: Sit to/from Stand Sit to Stand: Mod assist;+2 physical assistance Stand pivot transfers: Mod assist;+2 physical assistance       General transfer comment: Pt. assisted onto 3n1 at her request for + BM and urine.  Needs assist to stand upright for pivot and for cleaning after using 3n1.  Pt. with significant forward flexion of trunk in standing and unable to come to erect stance.  Pt. assisted into bed after use of 3n1.    Ambulation/Gait                 Stairs            Wheelchair Mobility    Modified Rankin (Stroke Patients Only)       Balance           Standing balance support:  Bilateral upper extremity supported;During functional activity Standing balance-Leahy Scale: Poor Standing balance comment: needs bilateral UE assist from therapist to maintain upright stance                    Cognition Arousal/Alertness: Awake/alert Behavior During Therapy: WFL for tasks assessed/performed Overall Cognitive Status: Within Functional Limits for tasks assessed                      Exercises      General Comments        Pertinent Vitals/Pain See vitals tab No distress, no pain    Home Living                      Prior Function            PT Goals (current goals can now be found in the care plan section) Progress towards PT goals: Progressing toward goals    Frequency  Min 2X/week    PT Plan Current plan remains appropriate    Co-evaluation             End of Session Equipment Utilized During Treatment: Gait belt;Oxygen Activity Tolerance: Patient limited by fatigue Patient left: in bed;with bed alarm set;with call bell/phone within reach;with nursing/sitter  in room     Time: 1335-1359 PT Time Calculation (min): 24 min  Charges:  $Therapeutic Activity: 23-37 mins                    G Codes:      Ferman Hamming 04/02/2014, 2:52 PM Weldon Picking PT Acute Rehab Services [phone] [place] [phone]

## 2014-04-03 DIAGNOSIS — R1900 Intra-abdominal and pelvic swelling, mass and lump, unspecified site: Secondary | ICD-10-CM

## 2014-04-03 DIAGNOSIS — I10 Essential (primary) hypertension: Secondary | ICD-10-CM

## 2014-04-03 DIAGNOSIS — E43 Unspecified severe protein-calorie malnutrition: Secondary | ICD-10-CM

## 2014-04-03 LAB — CULTURE, BLOOD (ROUTINE X 2)
CULTURE: NO GROWTH
Culture: NO GROWTH

## 2014-04-03 LAB — BASIC METABOLIC PANEL
Anion gap: 18 — ABNORMAL HIGH (ref 5–15)
BUN: 30 mg/dL — AB (ref 6–23)
CO2: 21 meq/L (ref 19–32)
Calcium: 8.2 mg/dL — ABNORMAL LOW (ref 8.4–10.5)
Chloride: 97 mEq/L (ref 96–112)
Creatinine, Ser: 1.88 mg/dL — ABNORMAL HIGH (ref 0.50–1.10)
GFR calc Af Amer: 26 mL/min — ABNORMAL LOW (ref >90)
GFR, EST NON AFRICAN AMERICAN: 22 mL/min — AB (ref >90)
GLUCOSE: 92 mg/dL (ref 70–99)
Potassium: 3.8 mEq/L (ref 3.7–5.3)
SODIUM: 136 meq/L — AB (ref 137–147)

## 2014-04-03 MED ORDER — DIPHENOXYLATE-ATROPINE 2.5-0.025 MG PO TABS
1.0000 | ORAL_TABLET | Freq: Four times a day (QID) | ORAL | Status: DC | PRN
  Administered 2014-04-04: 1 via ORAL
  Filled 2014-04-03: qty 1

## 2014-04-03 MED ORDER — QUETIAPINE FUMARATE 25 MG PO TABS
25.0000 mg | ORAL_TABLET | Freq: Every day | ORAL | Status: DC
  Administered 2014-04-03 – 2014-04-04 (×2): 25 mg via ORAL
  Filled 2014-04-03 (×3): qty 1

## 2014-04-03 MED ORDER — TRAZODONE HCL 50 MG PO TABS
50.0000 mg | ORAL_TABLET | Freq: Every evening | ORAL | Status: DC | PRN
  Filled 2014-04-03: qty 1

## 2014-04-03 MED ORDER — LORAZEPAM 1 MG PO TABS
1.0000 mg | ORAL_TABLET | ORAL | Status: DC | PRN
  Administered 2014-04-03 – 2014-04-04 (×2): 1 mg via ORAL
  Filled 2014-04-03 (×2): qty 1

## 2014-04-03 NOTE — Progress Notes (Signed)
Patient VW:[id number]:Leslie [name]      DOB: 16-Jun-1922      YNW:[id number]   [facility]    Subjective: Patient laying in bed with eyes open . No acute distress.  She states she slept better last night and relates that her tongue doesn't hurt as bad as yesterday.  No new issues.   Filed Vitals:   04/03/14 0532  BP: 124/65  Pulse: 72  Temp: 99.3 F (37.4 C)  Resp: 16   Physical exam: General. NAD, less pain today PERRL, EOMI, endentulous , tongue less swollen, tip slightly bruised Chest decreased but clear, no RR or W CVS: Regular, S1, S2 Abd: soft , not tender or distended Ext: warm, dressing over malleolar areas bilaterally, no edema Neuro: Awake, alert oriented   Lab Results  Component Value Date   CREATININE 1.88* 04/03/2014   BUN 30* 04/03/2014   NA 136* 04/03/2014   K 3.8 04/03/2014   CL 97 04/03/2014   CO2 21 04/03/2014   Lab Results  Component Value Date   WBC 13.5* 04/02/2014   HGB 12.7 04/02/2014   HCT 39.0 04/02/2014   MCV 89.4 04/02/2014   PLT 343 04/02/2014    Assessment and plan: 80 yr old white female found down at home diagnosis with NSTEMI, Rhabdo, acute on chronic renal failure with related acidosis.  Chronically she has hypertension, a renal mass , and CKD ( 1.3-1.5) Patient has elected a supportive, noninvasive course of therapy.  She wants to transition to rehab and will need supportive home setting as she can't go back to her independent living setting.   1.  DNR  2.  NSTEMI: medical management  3.  Glossitis improved.  Continue magic mouthwash and prn lidocaine  4.  Acute on Chronic renal failure with acidosis on bicarb orally.  Stable but not improved  Dispo to [facility] when medically stable .  Please request palliative care services consult at the facility.   Total time : 705 am - 730 am.   Sirenity Shew L. Ladona Ridgelaylor, MD MBA The Palliative Medicine Team at [facility] Team Phone: [phone] Pager: [phone]

## 2014-04-03 NOTE — Progress Notes (Signed)
[facility] PROGRESS NOTE  Tilman NeatDorothy W Siwik ZOX:[id number] DOB: 1921/12/10 DOA: 03/27/2014 PCP: Ginette OttoSTONEKING,HAL THOMAS, MD  Assessment/Plan  Spoke at length with the family regarding goals of care. They requested that the patient be made full comfort measures. They have asked that I try to get rid of any medications that are not directly related to comfort. We discussed stopping her aspirin, statin, blood pressure medications. The family would like to do so, since they are reducing the risk of heart attack, stroke, mortality weeks to months from now.  They would also like her to be able to sleep and be comfortable. I am going to adjust her pain, anxiety, and depression medications to assist with symptom management. They would also like her not to have any further blood draws at this time.  SIRS, the patient presented with low-grade fever, tachypnea, tachycardia. No source is identified. Her leukocytosis trended down spontaneously. Her cultures have all remained negative. Her C. difficile PCR was negative and her chest x-ray was unremarkable. Urinalysis was negative except for proteinuria.  -  Monitor clinically for signs of infection  Hypertensive emergency, initially placed on a labetalol infusion. Her blood pressure has remained stable after transitioning to nifedipine, hydralazine, and Lasix.  -  Family would like to stop all medications not directly related to comfort -  Will use lasix to help prevent SOB associated with high blood pressure in setting of CKD  Acute on chronic kidney injury with metabolic acidosis and rhabdomyolysis. She presented with a serum creatinine of 1.71 which worsened to 2.36. Her baseline renal function was approximately 1.6 a few months ago. She had to a mildly elevated CPK to 3683 which may have been from lying on the floor for an extended period of time. This initially trended down with IVF. She has had acute on chronic diastolic heart failure and was on IV Lasix.  Her creatinine started to trend down and has now stabilized around 1.8. Her metabolic acidosis was attributed to RTA and she was started on sodium bicarbonate tablets.  -  D/c sodium bicarb as not directly related to comfort  Right renal mass with severe hydronephrosis, however, that kidney was previously atrophic.   NSTEMI, troponin peaked at 4.69. She was seen by cardiology who recommended medical management. Her echocardiogram demonstrated preserved ejection fraction, grade 2 diastolic dysfunction, and new apical/distal wall akinesia. She continued aspirin and statin. Avoided beta blocker because of intermittent Mobitz type I second-degree heart block.   Acute and chronic diastolic dysfunction, chest x-ray on 7/6 demonstrated diffuse pulmonary edema. She was started on Lasix 40 mg IV twice a day. She now appears euvolemic and has no crackles on exam.  -  Continue lasix 40mg  daily  Hypokalemia, likely secondary to poor by mouth intake, diarrhea, Lasix. This resolved with oral and IV supplementation. -  No further blood work per family request   Hypothyroidism, TSH was within normal limits and she continued her Synthroid.   Acute diarrhea with history of chronic intermittent episodes. C. difficile PCR was negative. CT scan from 11/2013 demonstrated a spiculated mass in the mesentery in the right lower IJ which was suspicious for carcinoid, abdominal desmoid, or fibrosing mesenteritis. Given her intermittent diarrhea, suspect that this may be a carcinoid tumor. The mass appeared unchanged since the previous CT scan in 2012. Recommend followup with gastroenterology. Palliative care may also be able to assist with management.   -  Lomotil prn  Generalized weakness, deconditioning, and severe protein calorie malnutrition. She was  seen by physical therapy recommended for transfer to skilled nursing facility. She was also seen by palliative care and nutrition. She was started on supplements, and  palliative care we will continue to follow at her skilled nursing facility.  -  Family requesting hospice care (1st choice) vs. SNF with palliative following (2nd choice)  Insomnia, started on trazodone each evening.  -  Restart seroquel -  Increase ativan -  Continue pain medication  Glossitis, continue magic mouthwash  Diet:  Dysphagia to Access:  PIV IVF:  Off Proph:  SCDs  Code Status: DO NOT RESUSCITATE Family Communication: Patient, her daughter Disposition Plan: To skilled nursing facility with hospice versus skilled nursing facility with palliative care to follow probably on Monday if her symptoms remain better controlled   Consultants:  Cardiology Palliative care Procedures:  None Antibiotics:  Currently no on antibiotics (was on vanc and flagyl)  HPI/Subjective:  Patient states she feels better. She denies shortness of breath, but continues to have some cough. Denies abdominal pain but does have some persistent watery diarrhea. She is not sleeping very well.    Objective: Filed Vitals:   04/02/14 1757 04/02/14 2122 04/03/14 0500 04/03/14 0532  BP: 130/63 151/67  124/65  Pulse: 76 81  72  Temp: 98 F (36.7 C) 98.3 F (36.8 C)  99.3 F (37.4 C)  TempSrc: Oral Oral  Oral  Resp: 17 16  16   Height:  5' (1.524 m)    Weight:  53.6 kg (118 lb 2.7 oz) 53.6 kg (118 lb 2.7 oz)   SpO2: 92% 93%  90%    Intake/Output Summary (Last 24 hours) at 04/03/14 0816 Last data filed at 04/02/14 1842  Gross per 24 hour  Intake    420 ml  Output      0 ml  Net    420 ml   Filed Weights   04/01/14 2057 04/02/14 2122 04/03/14 0500  Weight: 54.7 kg (120 lb 9.5 oz) 53.6 kg (118 lb 2.7 oz) 53.6 kg (118 lb 2.7 oz)    Exam:  General: CF, NAD  HEENT: PERRL, Dry MM with bruising at tip of tongue  Cardiovascular: IRRR, no mrg, 2+ radial pulses  Respiratory: CTAB  ABD: NABS, soft, ND, mildly TTP without rebound or guarding  MSK: No LEE   Data Reviewed: Basic Metabolic  Panel:  Recent Labs Lab 03/30/14 0515 03/31/14 0313 04/01/14 0002 04/02/14 0617 04/03/14 0431  NA 139 132* 131* 132* 136*  K 4.1 3.4* 3.5* 4.9 3.8  CL 105 94* 94* 97 97  CO2 17* 17* 18* 17* 21  GLUCOSE 121* 111* 97 95 92  BUN 31* 29* 32* 30* 30*  CREATININE 1.84* 1.96* 2.06* 1.81* 1.88*  CALCIUM 8.4 8.7 8.4 8.1* 8.2*   Liver Function Tests:  Recent Labs Lab 03/27/14 1501 03/28/14 0655 03/28/14 1142  AST 23 44* 48*  ALT 14 14 15   ALKPHOS 117 73 78  BILITOT 0.4 0.5 0.6  PROT 8.2 6.6 7.2  ALBUMIN 3.7 2.8* 3.1*   No results found for this basename: LIPASE, AMYLASE,  in the last 168 hours No results found for this basename: AMMONIA,  in the last 168 hours CBC:  Recent Labs Lab 03/27/14 1501  03/28/14 2115 03/30/14 0240 03/31/14 0313 04/01/14 0002 04/02/14 0617  WBC 23.7*  < > 22.1* 22.0* 16.7* 13.5* 13.5*  NEUTROABS 21.2*  --   --   --  12.5*  --   --   HGB 15.5*  < >  12.3 12.0 12.6 12.4 12.7  HCT 46.9*  < > 38.0 37.3 38.4 38.0 39.0  MCV 90.0  < > 88.4 88.6 87.7 88.2 89.4  PLT 336  < > 284 338 340 321 343  < > = values in this interval not displayed. Cardiac Enzymes:  Recent Labs Lab 03/27/14 1501 03/27/14 1930 03/28/14 0040 03/28/14 0655 03/28/14 1142 03/31/14 0313 04/01/14 1116  CKTOTAL 85 203* 382* 1124* 1122* 296* 168  TROPONINI 0.38* 3.46* 3.56* 4.53*  4.69* 3.75*  --   --    BNP (last 3 results)  Recent Labs  12/18/13 1846 03/27/14 1501  PROBNP 2335.0* 14898.0*   CBG: No results found for this basename: GLUCAP,  in the last 168 hours  Recent Results (from the past 240 hour(s))  CULTURE, BLOOD (ROUTINE X 2)     Status: None   Collection Time    03/27/14  4:45 PM      Result Value Ref Range Status   Specimen Description BLOOD LEFT ANTECUBITAL   Final   Special Requests BOTTLES DRAWN AEROBIC AND ANAEROBIC 6CC EA   Final   Culture  Setup Time     Final   Value: 03/28/2014 02:10     Performed at [facility]   Culture      Final   Value:        BLOOD CULTURE RECEIVED NO GROWTH TO DATE CULTURE WILL BE HELD FOR 5 DAYS BEFORE ISSUING A FINAL NEGATIVE REPORT     Performed at [facility]   Report Status PENDING   Incomplete  CULTURE, BLOOD (ROUTINE X 2)     Status: None   Collection Time    03/27/14  5:24 PM      Result Value Ref Range Status   Specimen Description BLOOD RIGHT HAND   Final   Special Requests BOTTLES DRAWN AEROBIC ONLY 5CC   Final   Culture  Setup Time     Final   Value: 03/28/2014 02:10     Performed at [facility]   Culture     Final   Value:        BLOOD CULTURE RECEIVED NO GROWTH TO DATE CULTURE WILL BE HELD FOR 5 DAYS BEFORE ISSUING A FINAL NEGATIVE REPORT     Performed at [facility]   Report Status PENDING   Incomplete  MRSA PCR SCREENING     Status: None   Collection Time    03/27/14  6:08 PM      Result Value Ref Range Status   MRSA by PCR NEGATIVE  NEGATIVE Final   Comment:            The GeneXpert MRSA Assay (FDA     approved for NASAL specimens     only), is one component of a     comprehensive MRSA colonization     surveillance program. It is not     intended to diagnose MRSA     infection nor to guide or     monitor treatment for     MRSA infections.  CLOSTRIDIUM DIFFICILE BY PCR     Status: None   Collection Time    03/28/14  5:17 PM      Result Value Ref Range Status   C difficile by pcr NEGATIVE  NEGATIVE Final     Studies: No results found.  Scheduled Meds: . feeding supplement (RESOURCE BREEZE)  1 Container Oral BID BM  . furosemide  40 mg  Oral Daily  . levothyroxine  50 mcg Oral QAC breakfast  . magic mouthwash  5 mL Oral TID  . QUEtiapine  25 mg Oral QHS  . sertraline  25 mg Oral Daily  . sodium chloride  3 mL Intravenous Q12H   Continuous Infusions: . sodium chloride Stopped (03/30/14 0600)    Principal Problem:   SIRS (systemic inflammatory response syndrome) Active Problems:   Hypothyroidism   Hypoxia   CKD  (chronic kidney disease): baseline cr 1.3-1.5   Hypertensive emergency   Elevated troponin   Metabolic acidosis   Acute-on-chronic kidney injury   Protein-calorie malnutrition, severe    Time spent: 30 min    Tranise Forrest, [facility]  [facility] Pager [phone]. If 7PM-7AM, please contact night-coverage at [email or web address], password [facility] 04/03/2014, 8:16 AM  LOS: 7 days

## 2014-04-04 MED ORDER — LOPERAMIDE HCL 2 MG PO CAPS
2.0000 mg | ORAL_CAPSULE | ORAL | Status: DC | PRN
  Filled 2014-04-04: qty 1

## 2014-04-04 NOTE — Plan of Care (Signed)
Problem: Phase I Progression Outcomes Goal: Oral assessment and care per protocol Outcome: Completed/Met Date Met:  04/04/14 Oral thrush resolving with magic mouthwash.

## 2014-04-04 NOTE — Progress Notes (Signed)
[facility] PROGRESS NOTE  DELORESE SELLIN ZOX:[id number] DOB: [birth year] DOA: 03/27/2014 PCP: Ginette Otto, MD  Brief Summary  Ms. Guilford is a 80 yo woman with PMH of Renal mass, CKD, HTN, Hypothyroidism, recent UTI, HLD, PD, bradycardia who presents after a fall.  For that last few months, she had become more confused and less able to care for herself.  The date of admission, her great grandson found her on the ground faceup beside her bed. She was unconscious and did not regain consciousness until EMS arrived on the scene. She recently been treated with antibiotics for urinary tract infection. In the emergency department, she was hypoxic, tachypneic, hypertensive. She was admitted with sirs given low-grade fever, tachycardia, leukocytosis. She was started on broad-spectrum antibiotics which were discontinued once her cultures were negative.  Her hypertensive emergency (NSTEMI type 2) was treated with labetalol infusion and she was gradually transitioned to oral medications. Cardiology was consulted and recommended against catheterization at this time.  She had some mild rhabdomyolysis with acute kidney injury superimposed on chronic kidney disease which gradually improved. She presented with a serum creatinine of 1.71 which worsened to 2.36.  This was associated with a metabolic acidosis which was briefly treated with sodium bicarbonate.  She had to a mildly elevated CPK to 3683.  She has also been treated for acute on chronic diastolic heart failure with lasix 40mg  IV BID, now transitioned to oral lasix, and worked up for intermittent diarrhea.  She is not thriving.  Palliative care was consulted.  Spoke at length with the family regarding goals of care on 7/11. They requested that the patient be made full comfort measures. They asked that I stop medications not directly related to comfort. They would like her to be able to sleep and be comfortable. I am going to adjust her pain, anxiety, and  depression medications to assist with symptom management. They would also like her not to have any further blood draws at this time.  Assessment/Plan  S/p SIRS, No source identified, cultures negative.  C. Diff PCR negative.  Off antibiotics.   Hypertensive emergency, resolved -  Holding all medications not directly related to comfort -  continued lasix to help prevent SOB associated with high blood pressure in setting of CKD  Acute on chronic kidney injury with metabolic acidosis and rhabdomyolysis.   -  No further blood draws at this time.  Right renal mass with severe hydronephrosis, however, that kidney was previously atrophic.   NSTEMI, troponin peaked at 4.69. She was seen by cardiology who recommended medical management. Her echocardiogram demonstrated preserved ejection fraction, grade 2 diastolic dysfunction, and new apical/distal wall akinesia.  -  Aspirin and statin have been held due to transition to comfort measures -  No beta blocker due to intermittent Mobitz type I second-degree heart block.   Acute and chronic diastolic dysfunction, chest x-ray on 7/6 demonstrated diffuse pulmonary edema.  Currently euvolemic. -  Continue lasix 40mg  po daily  Hypokalemia, due to diarrhea, Lasix, resolved   Hypothyroidism, TSH wnl, continue Synthroid.   Acute diarrhea with history of chronic intermittent episodes. C. difficile PCR negative. CT scan from 11/2013 demonstrated a spiculated mass in the mesentery in the right lower IJ which was suspicious for carcinoid, abdominal desmoid, or fibrosing mesenteritis. Given her intermittent diarrhea, suspect that this may be a carcinoid tumor. The mass appeared unchanged since the previous CT scan in 2012.  Palliative care may also be able to assist with management if it  becomes worse.   -  Lomotil prn  Generalized weakness, deconditioning, and severe protein calorie malnutrition. She was seen by physical therapy recommended for transfer to  skilled nursing facility. She was also seen by palliative care and nutrition. She was started on supplements, and palliative care we will continue to follow at her skilled nursing facility.  -  Family requesting hospice care (1st choice) vs. SNF with palliative following (2nd choice) -  Palliative care to please advise whether she would qualify for hospice care  Insomnia, slept much better last night -  Continue trazodone  -  Restarted seroquel on 7/11 -  Continue ativan -  Continue pain medication  Glossitis, continue magic mouthwash  Diet:  Dysphagia 2 Access:  none IVF:  Off Proph:  SCDs  Code Status: DO NOT RESUSCITATE Family Communication: Patient, her daughter Disposition Plan: To skilled nursing facility with hospice versus skilled nursing facility with palliative care to follow probably on Monday if her symptoms remain better controlled   Consultants:  Cardiology Palliative care Procedures:  None Antibiotics:  Currently no on antibiotics (was on vanc and flagyl)  HPI/Subjective:  States she has very little appetite.  Slept much better last night.    Objective: Filed Vitals:   04/03/14 0916 04/03/14 1723 04/03/14 2035 04/04/14 0619  BP: 119/52 120/64 150/76 134/63  Pulse: 65 72 63 72  Temp: 98 F (36.7 C) 98.2 F (36.8 C) 98.5 F (36.9 C) 98.5 F (36.9 C)  TempSrc: Oral Oral Oral Oral  Resp: 18 18 18 18   Height:   5' (1.524 m)   Weight:   54.6 kg (120 lb 5.9 oz)   SpO2: 93% 94% 93% 94%    Intake/Output Summary (Last 24 hours) at 04/04/14 0731 Last data filed at 04/03/14 2200  Gross per 24 hour  Intake    363 ml  Output      0 ml  Net    363 ml   Filed Weights   04/02/14 2122 04/03/14 0500 04/03/14 2035  Weight: 53.6 kg (118 lb 2.7 oz) 53.6 kg (118 lb 2.7 oz) 54.6 kg (120 lb 5.9 oz)    Exam:  General: CF, NAD, sleeping but easily arouseable.  Smiling, pleasant and appropriate HEENT: PERRL, Dry MM with bruising at tip of tongue  Cardiovascular:   IRRR, no mrg, 2+ radial pulses  Respiratory: CTAB  ABD: NABS, soft, ND, mildly TTP without rebound or guarding  MSK: No LEE   Data Reviewed: Basic Metabolic Panel:  Recent Labs Lab 03/30/14 0515 03/31/14 0313 04/01/14 0002 04/02/14 0617 04/03/14 0431  NA 139 132* 131* 132* 136*  K 4.1 3.4* 3.5* 4.9 3.8  CL 105 94* 94* 97 97  CO2 17* 17* 18* 17* 21  GLUCOSE 121* 111* 97 95 92  BUN 31* 29* 32* 30* 30*  CREATININE 1.84* 1.96* 2.06* 1.81* 1.88*  CALCIUM 8.4 8.7 8.4 8.1* 8.2*   Liver Function Tests:  Recent Labs Lab 03/28/14 1142  AST 48*  ALT 15  ALKPHOS 78  BILITOT 0.6  PROT 7.2  ALBUMIN 3.1*   No results found for this basename: LIPASE, AMYLASE,  in the last 168 hours No results found for this basename: AMMONIA,  in the last 168 hours CBC:  Recent Labs Lab 03/28/14 2115 03/30/14 0240 03/31/14 0313 04/01/14 0002 04/02/14 0617  WBC 22.1* 22.0* 16.7* 13.5* 13.5*  NEUTROABS  --   --  12.5*  --   --   HGB 12.3 12.0 12.6 12.4  12.7  HCT 38.0 37.3 38.4 38.0 39.0  MCV 88.4 88.6 87.7 88.2 89.4  PLT 284 338 340 321 343   Cardiac Enzymes:  Recent Labs Lab 03/28/14 1142 03/31/14 0313 04/01/14 1116  CKTOTAL 1122* 296* 168  TROPONINI 3.75*  --   --    BNP (last 3 results)  Recent Labs  12/18/13 1846 03/27/14 1501  PROBNP 2335.0* 14898.0*   CBG: No results found for this basename: GLUCAP,  in the last 168 hours  Recent Results (from the past 240 hour(s))  CULTURE, BLOOD (ROUTINE X 2)     Status: None   Collection Time    03/27/14  4:45 PM      Result Value Ref Range Status   Specimen Description BLOOD LEFT ANTECUBITAL   Final   Special Requests BOTTLES DRAWN AEROBIC AND ANAEROBIC 6CC EA   Final   Culture  Setup Time     Final   Value: 03/28/2014 02:10     [facility]formed [facility]   Culture     Final   Value: NO GROWTH 5 DAYS     [facility] at [facility]   Report Status 04/03/2014 FINAL   Final  CULTURE, BLOOD (ROUTINE X 2)      Status: None   Collection Time    03/27/14  5:24 PM      Result Value Ref Range Status   Specimen Description BLOOD RIGHT HAND   Final   Special Requests BOTTLES DRAWN AEROBIC ONLY 5CC   Final   Culture  Setup Time     Final   Value: 03/28/2014 02:10     [facility]formed at [facility]   Culture     Final   Value: NO GROWTH 5 DAYS     [facility]formed at [facility]   Report Status 04/03/2014 FINAL   Final  MRSA PCR SCREENING     Status: None   Collection Time    03/27/14  6:08 PM      Result Value Ref Range Status   MRSA by PCR NEGATIVE  NEGATIVE Final   Comment:            The GeneXpert MRSA Assay (FDA     approved for NASAL specimens     only), is one component of a     comprehensive MRSA colonization     surveillance program. It is not     intended to diagnose MRSA     infection nor to guide or     monitor treatment for     MRSA infections.  CLOSTRIDIUM DIFFICILE BY PCR     Status: None   Collection Time    03/28/14  5:17 PM      Result Value Ref Range Status   C difficile by pcr NEGATIVE  NEGATIVE Final     Studies: No results found.  Scheduled Meds: . feeding supplement (RESOURCE BREEZE)  1 Container Oral BID BM  . furosemide  40 mg Oral Daily  . levothyroxine  50 mcg Oral QAC breakfast  . magic mouthwash  5 mL Oral TID  . QUEtiapine  25 mg Oral QHS  . sertraline  25 mg Oral Daily  . sodium chloride  3 mL Intravenous Q12H   Continuous Infusions: . sodium chloride Stopped (03/30/14 0600)    Principal Problem:   SIRS (systemic inflammatory response syndrome) Active Problems:   Hypothyroidism   Hypoxia   CKD (chronic kidney disease): baseline cr 1.3-1.5  Hypertensive emergency   Elevated troponin   Metabolic acidosis   Acute-on-chronic kidney injury   Protein-calorie malnutrition, severe    Time spent: 30 min    Oral Remache, [employer]  [facility] Pager [phone]. If 7PM-7AM, please contact night-coverage at [email or web address], password  [facility] 04/04/2014, 7:31 AM  LOS: 8 days

## 2014-04-04 NOTE — Progress Notes (Signed)
Patient's IV access outdated, however since family has requested comfort measures, patient's RN and IV nurse agreed not to restart a new IV. IV nurse requests to be called to reassess situation if new update concerning IV access arrives. Will pass information to morning RN.

## 2014-04-05 MED ORDER — LOPERAMIDE HCL 2 MG PO CAPS: 2.0000 mg | ORAL_CAPSULE | ORAL | Status: AC | PRN

## 2014-04-05 MED ORDER — FUROSEMIDE 40 MG PO TABS: 40.0000 mg | ORAL_TABLET | Freq: Every day | ORAL | Status: DC

## 2014-04-05 MED ORDER — DIPHENOXYLATE-ATROPINE 2.5-0.025 MG PO TABS: 1.0000 | ORAL_TABLET | Freq: Four times a day (QID) | ORAL | Status: AC | PRN

## 2014-04-05 MED ORDER — HYDROCODONE-ACETAMINOPHEN 5-325 MG PO TABS: 1.0000 | ORAL_TABLET | ORAL | Status: AC | PRN

## 2014-04-05 MED ORDER — LORAZEPAM 1 MG PO TABS: 1.0000 mg | ORAL_TABLET | Freq: Three times a day (TID) | ORAL | Status: AC

## 2014-04-05 MED ORDER — MAGIC MOUTHWASH: 5.0000 mL | Freq: Three times a day (TID) | ORAL | Status: AC

## 2014-04-05 MED ORDER — LIDOCAINE VISCOUS 2 % MT SOLN: 15.0000 mL | OROMUCOSAL | Status: AC | PRN

## 2014-04-05 MED ORDER — BOOST / RESOURCE BREEZE PO LIQD: 1.0000 | Freq: Two times a day (BID) | ORAL | Status: AC

## 2014-04-05 MED ORDER — DIPHENHYDRAMINE HCL 25 MG PO CAPS: 25.0000 mg | ORAL_CAPSULE | Freq: Three times a day (TID) | ORAL | Status: AC | PRN

## 2014-04-05 MED ORDER — LORAZEPAM 1 MG PO TABS: 1.0000 mg | ORAL_TABLET | ORAL | Status: DC | PRN

## 2014-04-05 NOTE — Progress Notes (Signed)
Clinical Social Worker facilitated patient discharge including contacting patient, family and facility to confirm patient discharge plans.  Clinical information faxed to facility and patient agreeable with plan.  CSW [facility].  RN to call report prior to discharge.  Clinical Social Worker will sign off for now as social work intervention is no longer needed. Please consult us again if new need arises.  Maree KrabbeLindsay Shanvi Moyd, MSW, Theresia MajorsLCSWA [phone]

## 2014-04-05 NOTE — Progress Notes (Signed)
Occupational Therapy Treatment Patient Details Name: Leslie [name] MRN: [id number] DOB: 1922/08/15 Today's Date: 04/05/2014    History of present illness Pt is a 80 y/o female admitted s/p increased lethargy and fall at home. Great-grandson found pt lying face-up next to her bed the morning of admission, and was not responsive until EMS arrived. In the ED, pt was found to have had a NSTEMI.   OT comments  Pt making progress with functional goals, OT will continue to follow  Follow Up Recommendations  SNF;Supervision/Assistance - 24 hour    Equipment Recommendations  None recommended by OT    Recommendations for Other Services      Precautions / Restrictions Precautions Precautions: Fall Restrictions Weight Bearing Restrictions: No       Mobility Bed Mobility Overal bed mobility: Needs Assistance Bed Mobility: Supine to Sit     Supine to sit: Min assist     General bed mobility comments: min A with trunk elevation  Transfers Overall transfer level: Needs assistance   Transfers: Sit to/from Stand;Stand Pivot Transfers Sit to Stand: +2 physical assistance;Min assist Stand pivot transfers: Min assist;+2 physical assistance            Balance Overall balance assessment: Needs assistance Sitting-balance support: Single extremity supported;No upper extremity supported;Feet supported Sitting balance-Leahy Scale: Fair     Standing balance support: Bilateral upper extremity supported;During functional activity Standing balance-Leahy Scale: Poor                     ADL       Grooming: Wash/dry hands;Wash/dry face;Sitting;Min guard;Set up   Upper Body Bathing: Sitting;Minimal assitance   Lower Body Bathing: Maximal assistance;Sitting/lateral leans   Upper Body Dressing : Sitting;Minimal assistance       Toilet Transfer: +2 for physical assistance;BSC;Minimal assistance   Toileting- Clothing Manipulation and Hygiene: Total assistance        Functional mobility during ADLs: +2 for physical assistance;Minimal assistance General ADL Comments: nurse tech assisted OT this session, pt +2 physical assist with transfers      Vision  wears glasses                   Perception Perception Perception Tested?: No   Praxis Praxis Praxis tested?: Not tested    Cognition   Behavior During Therapy: [facility] for tasks assessed/performed Overall Cognitive Status: Within Functional Limits for tasks assessed                                                 General Comments  Pt very pleasant and cooperative    Pertinent Vitals/ Pain       No c/o pain, VSS                                                          Frequency Min 2X/week     Progress Toward Goals  OT Goals(current goals can now be found in the care plan section)  Progress towards OT goals: Progressing toward goals     Plan Discharge plan remains appropriate  End of Session Equipment Utilized During Treatment: Gait belt   Activity Tolerance Patient tolerated treatment well   Patient Left in chair;with call bell/phone within reach;with nursing/sitter in room             Time: 1028-1055 OT Time Calculation (min): 27 min  Charges: OT General Charges $OT Visit: 1 Procedure OT Treatments $Self Care/Home Management : 8-22 mins $Therapeutic Activity: 8-22 mins  Galen ManilaSpencer, Ming Mcmannis Jeanette 04/05/2014, 2:08 PM

## 2014-04-05 NOTE — Discharge Summary (Addendum)
Physician Discharge Summary  Leslie [name] ZOX:[id number] DOB: 08/27/1922 DOA: 03/27/2014  PCP: Leslie Otto, MD  Admit date: 03/27/2014 Discharge date: 04/05/2014  Recommendations for Outpatient Follow-up:  1. Pt will need palliative care consult at the nursing home 2. PRN blood pressure medications can be added to home medications as deemed necessary by MD at the SNF 3. Recheck BMET for renal function as needed.   Discharge Diagnoses:  Principal Problem:   SIRS (systemic inflammatory response syndrome) Active Problems:   Hypothyroidism   Hypoxia   CKD (chronic kidney disease): baseline cr 1.3-1.5   Hypertensive emergency   Elevated troponin   Metabolic acidosis   Acute-on-chronic kidney injury   Protein-calorie malnutrition, severe  Discharge Condition: Stable  Diet recommendation: Dysphagia 2 diet  Brief Hospital Course:  Leslie [name] is a 80 yo woman with PMH of Renal mass, CKD, HTN, Hypothyroidism, recent UTI, HLD, PD, bradycardia who presents after a fall. For that last few months, she had become more confused and less able to care for herself. On the day of admission, her great grandson found her on the ground faceup beside her bed. She was unconscious and did not regain consciousness until EMS arrived on the scene. She had recently been treated with antibiotics for urinary tract infection. In the emergency department, she was hypoxic, tachypneic, hypertensive. She was admitted with sirs given low-grade fever, tachycardia, leukocytosis. She was started on broad-spectrum antibiotics which were discontinued once her cultures were negative. Her hypertensive emergency (NSTEMI type 2) was treated with labetalol infusion and she was gradually transitioned to oral medications. Cardiology was consulted and recommended against catheterization at this time. She had some mild rhabdomyolysis with acute kidney injury superimposed on chronic kidney disease which gradually improved. This was  associated with a metabolic acidosis which was briefly treated with sodium bicarbonate. She has also been treated for acute on chronic diastolic heart failure with lasix 40mg  IV BID, now transitioned to oral lasix, and worked up for intermittent diarrhea, being treated with immodium. She is not thriving. Palliative care was consulted who spoke at length with the family regarding goals of care on 7/11. They requested that the Leslie [name] be made full comfort measures. She is now on all medications that are for comfort only. The family would also like her not to have any further blood draws at this time.   Hospital Course:   SIRS (systemic inflammatory response syndrome): She was initially admitted for this issue and started on broad spectrum Abx, however, cultures were negative and Cdiff PCR was also negative.  No acute source of infection was found and she was transitioned off of antibiotics.  Her WBC remained high but was trending down when blood draws were stopped.   Hypertensive emergency: Initially she was placed on labetalol drip and her BP improved dramatically.  She did have an NSTEMI related to her HTN which was managed by Cardiology and no intervention was deemed necessary.  Her BP medications were discontinued due to comfort measures, however, her nifedipine will be resumed at discharge as her BP has been elevated and she was admitted with NSTEMI from very high blood pressure. She has been continued on lasix PO daily to avoid any further hypoxia or volume overload.  Her hydralazine was discontinued.   Hypothyroidism: She has been continued on levothyroxine, TSH was 3.29  Hypoxia: Resolved, likely due to acute issues as noted above.   Acute on CKD (chronic kidney disease): baseline cr 1.3-1.5: Her Cr was elevated and trended  down to around 1.8 at last blood draw.  This was thought to be due to hypertensive emergency, possibly some rhabdomyolysis as she was found down and CK elevated, and also due  to decreased PO intake.  Her baseline is now likely around 1.8.  Will need to be checked at the SNF for improvement.     NSTEMI in the setting of HTN emergency: TnI peaked at 4.69, TTE revealed a grade 2 diastolic dysfunction.  She has not been placed on asa, bb or statin due to comfort measures.  She will be on a CCB, nifedipine.  She is to be continued on lasix daily to avoid any volume overload.   Protein-calorie malnutrition, severe, generalized weakness: Feeding supplement started with resource breeze and encourage PO as tolerated  Insomnia: Improved with initiation of prn ativan and restart of seroquel.  She is also on sertraline  Acute intermittent diarrhea: CT scan from 11/2013 revealed a GI mass, possibly carcinoid which could explain her intermittent symptoms.  No further work up is desired at this time by family.  She has been treated with PRN lomotil and PRN imodium as needed.  Glossitis: Improved with magic mouthwash, continued.   Right sided renal mass: Unknown source, causing right sided hydronephrosis, no further work up at this time.   She is also on PRN Norco for pain.   Procedures/Studies: Dg Pelvis 1-2 Views  03/27/2014   CLINICAL DATA:  Status post fall  EXAM: PELVIS - 1-2 VIEW  COMPARISON:  None.  FINDINGS: The bony pelvis is osteopenic. There is mild symmetric narrowing of the hip joints. The SI joints are intact. The observed portions of the sacrum are normal. There are degenerative changes of the lumbar spine.  IMPRESSION: There is no acute pelvic fracture.   Electronically Signed   By: Leslie  [name]   On: 03/27/2014 16:16   Ct Head Wo Contrast  03/27/2014   CLINICAL DATA:  Larey Seat.  Unresponsive.  EXAM: CT HEAD WITHOUT CONTRAST  CT CERVICAL SPINE WITHOUT CONTRAST  TECHNIQUE: Multidetector CT imaging of the head and cervical spine was performed following the standard protocol without intravenous contrast. Multiplanar CT image reconstructions of the cervical spine were also  generated.  COMPARISON:  Head CT 04/28/2012  FINDINGS: CT HEAD FINDINGS  Stable age related cerebral atrophy, ventriculomegaly and periventricular white matter disease. No extra-axial fluid collections are identified. No CT findings for acute hemispheric infarction or intracranial hemorrhage. No mass lesions. The brainstem and cerebellum are normal.  No acute bony findings. No fracture or bone lesion. The paranasal sinuses and mastoid air cells are clear. The globes are intact.  CT CERVICAL SPINE FINDINGS  Degenerative cervical spondylosis with multilevel disc disease and facet disease. The alignment is maintained. No acute fracture or abnormal prevertebral soft tissue swelling. The skullbase C1 and C1-2 articulations are maintained. The dens is intact. Multilevel foraminal stenosis due to uncinate spurring and facet disease.  Carotid artery calcifications are noted bilaterally. No neck mass or adenopathy. The lung apices are grossly clear.  IMPRESSION: 1. Stable age related cerebral atrophy, ventriculomegaly and periventricular white matter disease. 2. No acute intracranial findings or skull fracture. 3. Degenerative cervical spondylosis but normal alignment and no acute cervical spine fracture.   Electronically Signed   By: Loralie Champagne M.D.   On: 03/27/2014 16:08   Ct Cervical Spine Wo Contrast  03/27/2014   CLINICAL DATA:  Larey Seat.  Unresponsive.  EXAM: CT HEAD WITHOUT CONTRAST  CT CERVICAL SPINE WITHOUT CONTRAST  TECHNIQUE: Multidetector CT imaging of the head and cervical spine was performed following the standard protocol without intravenous contrast. Multiplanar CT image reconstructions of the cervical spine were also generated.  COMPARISON:  Head CT 04/28/2012  FINDINGS: CT HEAD FINDINGS  Stable age related cerebral atrophy, ventriculomegaly and periventricular white matter disease. No extra-axial fluid collections are identified. No CT findings for acute hemispheric infarction or intracranial hemorrhage.  No mass lesions. The brainstem and cerebellum are normal.  No acute bony findings. No fracture or bone lesion. The paranasal sinuses and mastoid air cells are clear. The globes are intact.  CT CERVICAL SPINE FINDINGS  Degenerative cervical spondylosis with multilevel disc disease and facet disease. The alignment is maintained. No acute fracture or abnormal prevertebral soft tissue swelling. The skullbase C1 and C1-2 articulations are maintained. The dens is intact. Multilevel foraminal stenosis due to uncinate spurring and facet disease.  Carotid artery calcifications are noted bilaterally. No neck mass or adenopathy. The lung apices are grossly clear.  IMPRESSION: 1. Stable age related cerebral atrophy, ventriculomegaly and periventricular white matter disease. 2. No acute intracranial findings or skull fracture. 3. Degenerative cervical spondylosis but normal alignment and no acute cervical spine fracture.   Electronically Signed   By: Loralie ChampagneMark  Gallerani M.D.   On: 03/27/2014 16:08   Dg Chest Port 1 View  03/29/2014   CLINICAL DATA:  Shortness of breath. Abnormal breath sounds. Systemic inflammatory response syndrome.  EXAM: PORTABLE CHEST - 1 VIEW  COMPARISON:  03/27/2014  FINDINGS: New diffuse bilateral airspace disease is seen, consistent with diffuse pulmonary edema. Elevation of right hemidiaphragm is unchanged. Heart size is mildly largest stable. Small pleural effusions cannot be excluded.  IMPRESSION: Increased diffuse pulmonary edema. Stable mild cardiomegaly. Cannot exclude small bilateral pleural effusions.   Electronically Signed   By: Myles RosenthalJohn  Stahl M.D.   On: 03/29/2014 23:46   Dg Chest Portable 1 View  03/27/2014   CLINICAL DATA:  Fall, cough/congestion  EXAM: PORTABLE CHEST - 1 VIEW  COMPARISON:  12/18/2013  FINDINGS: Chronic interstitial markings/ emphysematous changes. No focal consolidation. Mild elevation of the right hemidiaphragm. No pleural effusion or pneumothorax.  The heart is top-normal in  size.  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  IMPRESSION: No evidence of acute cardiopulmonary disease.   Electronically Signed   By: Charline BillsSriyesh  Krishnan M.D.   On: 03/27/2014 14:39      Consultations:  Cardiology  Palliative Care  Antibiotics:  Currently none, was on Vancomycin and Flagyl  Discharge Exam: Filed Vitals:   04/05/14 0645  BP: 154/72  Pulse: 62  Temp: 98.5 F (36.9 C)  Resp: 18   Filed Vitals:   04/04/14 0909 04/04/14 1705 04/04/14 2107 04/05/14 0645  BP: 146/64 179/65 162/67 154/72  Pulse: 80 70 65 62  Temp: 99.2 F (37.3 C) 98.6 F (37 C) 98.3 F (36.8 C) 98.5 F (36.9 C)  TempSrc: Oral Oral Oral Oral  Resp: 18 18 18 18   Height:   5' (1.524 m)   Weight:   123 lb 7.3 oz (56 kg)   SpO2: 96% 96% 95% 96%    General: Pt is alert, answers questions, [place] in place  Cardiovascular: Irreg, Irreg, no murmur  Respiratory: Clear to auscultation bilaterally, no wheezing  Abdomen: Soft, non tender, +BS  Extremities: No edema, pulses DP and PT palpable bilaterally  Neuro: Grossly nonfocal   Discharge Instructions     Medication List    STOP taking these medications  aspirin 81 MG tablet     hydrALAZINE 25 MG tablet  Commonly known as:  APRESOLINE     montelukast 10 MG tablet  Commonly known as:  SINGULAIR     pentosan polysulfate 100 MG capsule  Commonly known as:  ELMIRON     rOPINIRole 1 MG tablet  Commonly known as:  REQUIP      TAKE these medications       acetaminophen 500 MG tablet  Commonly known as:  TYLENOL  Take 1,000 mg by mouth at bedtime.     bisacodyl 10 MG suppository  Commonly known as:  DULCOLAX  Place 1 suppository (10 mg total) rectally daily as needed for moderate constipation.     diphenhydrAMINE 25 mg capsule  Commonly known as:  BENADRYL  Take 1 capsule (25 mg total) by mouth every 8 (eight) hours as needed for itching.     diphenoxylate-atropine 2.5-0.025 MG per tablet   Commonly known as:  LOMOTIL  Take 1 tablet by mouth 4 (four) times daily as needed for diarrhea or loose stools.     famotidine 20 MG tablet  Commonly known as:  PEPCID  Take 20 mg by mouth daily.     feeding supplement (RESOURCE BREEZE) Liqd  Take 1 Container by mouth 2 (two) times daily between meals.     furosemide 40 MG tablet  Commonly known as:  LASIX  Take 1 tablet (40 mg total) by mouth daily.     HYDROcodone-acetaminophen 5-325 MG per tablet  Commonly known as:  NORCO/VICODIN  Take 1-2 tablets by mouth every 4 (four) hours as needed for moderate pain.     levothyroxine 50 MCG tablet  Commonly known as:  SYNTHROID, LEVOTHROID  Take 50 mcg by mouth daily.     lidocaine 2 % solution  Commonly known as:  XYLOCAINE  Use as directed 15 mLs in the mouth or throat every 3 (three) hours as needed for mouth pain.     loperamide 2 MG capsule  Commonly known as:  IMODIUM  Take 1 capsule (2 mg total) by mouth as needed for diarrhea or loose stools.     LORazepam 1 MG tablet  Commonly known as:  ATIVAN  Take 1 tablet (1 mg total) by mouth 3 (three) times daily.     magic mouthwash Soln  Take 5 mLs by mouth 3 (three) times daily.     NIFEdipine 90 MG 24 hr tablet  Commonly known as:  PROCARDIA XL/ADALAT-CC  Take 90 mg by mouth at bedtime.     QUEtiapine 25 MG tablet  Commonly known as:  SEROQUEL  Take 25 mg by mouth at bedtime.     sertraline 25 MG tablet  Commonly known as:  ZOLOFT  Take 25 mg by mouth daily.       Follow-up Information   Follow up with [facility]. (Left message with scheduler to schedule outpt followup in 1-64mo, if you do not hear from [place] within 2 days after discharge, please call)    Specialty:  Cardiology   Contact information:   [address] [place] [address] [phone]      Follow up with Leslie Otto, MD. Schedule an appointment as soon as possible for a visit in 1 month. (As needed)     Specialty:  Internal Medicine   Contact information:   [address] [place] [address] [phone]  The results of significant diagnostics from this hospitalization (including imaging, microbiology, ancillary and laboratory) are listed below for reference.     Microbiology: Recent Results (from the past 240 hour(s))  CULTURE, BLOOD (ROUTINE X 2)     Status: None   Collection Time    03/27/14  4:45 PM      Result Value Ref Range Status   Specimen Description BLOOD LEFT ANTECUBITAL   Final   Special Requests BOTTLES DRAWN AEROBIC AND ANAEROBIC 6CC EA   Final   Culture  Setup Time     Final   Value: 03/28/2014 02:10     Performed at [facility]   Culture     Final   Value: NO GROWTH 5 DAYS     Performed at [facility]   Report Status 04/03/2014 FINAL   Final  CULTURE, BLOOD (ROUTINE X 2)     Status: None   Collection Time    03/27/14  5:24 PM      Result Value Ref Range Status   Specimen Description BLOOD RIGHT HAND   Final   Special Requests BOTTLES DRAWN AEROBIC ONLY 5CC   Final   Culture  Setup Time     Final   Value: 03/28/2014 02:10     Performed at [facility]   Culture     Final   Value: NO GROWTH 5 DAYS     Performed at [facility]   Report Status 04/03/2014 FINAL   Final  MRSA PCR SCREENING     Status: None   Collection Time    03/27/14  6:08 PM      Result Value Ref Range Status   MRSA by PCR NEGATIVE  NEGATIVE Final   Comment:            The GeneXpert MRSA Assay (FDA     approved for NASAL specimens     only), is one component of a     comprehensive MRSA colonization     surveillance program. It is not     intended to diagnose MRSA     infection nor to guide or     monitor treatment for     MRSA infections.  CLOSTRIDIUM DIFFICILE BY PCR     Status: None   Collection Time    03/28/14  5:17 PM      Result Value Ref Range Status   C difficile by pcr NEGATIVE  NEGATIVE Final      Labs: Basic Metabolic Panel:  Recent Labs Lab 03/30/14 0515 03/31/14 0313 04/01/14 0002 04/02/14 0617 04/03/14 0431  NA 139 132* 131* 132* 136*  K 4.1 3.4* 3.5* 4.9 3.8  CL 105 94* 94* 97 97  CO2 17* 17* 18* 17* 21  GLUCOSE 121* 111* 97 95 92  BUN 31* 29* 32* 30* 30*  CREATININE 1.84* 1.96* 2.06* 1.81* 1.88*  CALCIUM 8.4 8.7 8.4 8.1* 8.2*   Liver Function Tests: No results found for this basename: AST, ALT, ALKPHOS, BILITOT, PROT, ALBUMIN,  in the last 168 hours No results found for this basename: LIPASE, AMYLASE,  in the last 168 hours No results found for this basename: AMMONIA,  in the last 168 hours CBC:  Recent Labs Lab 03/30/14 0240 03/31/14 0313 04/01/14 0002 04/02/14 0617  WBC 22.0* 16.7* 13.5* 13.5*  NEUTROABS  --  12.5*  --   --   HGB 12.0 12.6 12.4 12.7  HCT 37.3 38.4 38.0 39.0  MCV 88.6 87.7  88.2 89.4  PLT 338 340 321 343   Cardiac Enzymes:  Recent Labs Lab 03/31/14 0313 04/01/14 1116  CKTOTAL 296* 168   BNP: BNP (last 3 results)  Recent Labs  12/18/13 1846 03/27/14 1501  PROBNP 2335.0* 14898.0*   CBG: No results found for this basename: GLUCAP,  in the last 168 hours   SIGNED: Time coordinating discharge: 35 minutes  Debe Coder, MD  [facility] 04/05/2014, 3:02 PM Pager [phone]  If 7PM-7AM, please contact night-coverage [email or web address] Password [email or web address]

## 2014-04-05 NOTE — Progress Notes (Signed)
Pt discharged to snf per MD. [facility] discontinued with catheter intact. [facility]. Pt assisted with voiding and changing clothes. Hortencia ConradiWendi Tanashia Ciesla, RN

## 2014-04-05 NOTE — Discharge Instructions (Signed)
Ms. Excell SeltzerBaker - -   You will be discharged back to your nursing home with medications to help you sleep and to control your loose bowel movements.

## 2014-04-05 NOTE — Progress Notes (Signed)
[facility] PROGRESS NOTE  Leslie NeatDorothy W [name] NWG:[id number] DOB: 25-Nov-1921 DOA: 03/27/2014 PCP: Ginette OttoSTONEKING,HAL THOMAS, MD  Brief narrative: Ms. Excell SeltzerBaker is a 80 yo woman with PMH of Renal mass, CKD, HTN, Hypothyroidism, recent UTI, HLD, PD, bradycardia who presents after a fall. For that last few months, she had become more confused and less able to care for herself. On the day of admission, her great grandson found her on the ground faceup beside her bed. She was unconscious and did not regain consciousness until EMS arrived on the scene. She had recently been treated with antibiotics for urinary tract infection. In the emergency department, she was hypoxic, tachypneic, hypertensive. She was admitted with sirs given low-grade fever, tachycardia, leukocytosis. She was started on broad-spectrum antibiotics which were discontinued once her cultures were negative. Her hypertensive emergency (NSTEMI type 2) was treated with labetalol infusion and she was gradually transitioned to oral medications. Cardiology was consulted and recommended against catheterization at this time. She had some mild rhabdomyolysis with acute kidney injury superimposed on chronic kidney disease which gradually improved.  This was associated with a metabolic acidosis which was briefly treated with sodium bicarbonate.  She has also been treated for acute on chronic diastolic heart failure with lasix 40mg  IV BID, now transitioned to oral lasix, and worked up for intermittent diarrhea, being treated with immodium. She is not thriving. Palliative care was consulted who spoke at length with the family regarding goals of care on 7/11. They requested that the patient be made full comfort measures. She is now on all medications that are for comfort only. The family would also like her not to have any further blood draws at this time.   Assessment/Plan:  SIRS (systemic inflammatory response syndrome): No source identified, cultures have been  negative. All Abx stopped.  Cdiff negative.   Hypertensive emergency: resolved - Holding all medications that are not for comfort at this time, BP today is slightly elevated  - Continuing lasix to avoid SOB  Chronic diastolic HF: Being treated with PO lasix to avoid volume overload   Acute on CKD (chronic kidney disease): baseline cr 1.3-1.5: Cr improved to around 1.8, this may be new baseline.  No further blood draws - Likely related to acute SIRS, rhabdo after being found down  NSTEMI: TTE showed grade 2 diastolic dysfunction, TnI peaked at 4.69 - Asa and statin held due to transition to comfort only measures - No bb given mobitz type 1 2nd degree heart block  Acute diarrhea, intermittent: She has a GI mass as noted on CT scan 11/2013, possibly carcinoid.  No further work up at this time - She is getting lomotil PRN and imodium  Protein-calorie malnutrition, severe, generalized weakness: PC consult placed and that team is following.  Possible d/c to SNF with palliative services today.  - Feeding supplement with resource breeze and dysphagia 2 diet as tolerated  Hypothyroidism: Continue synthroid  Insomnia: Reports sleeping much better - Continue trazadone, prn ativan, sertraline, seroquel  Glossitis: Continue magic mouthwash  Right sided renal mass: Unknown source for mass per CT scan in March. Causing right sided hydronephrosis.  No further work up at this time.     Diet: Dysphagia 2 DVT PPx: SCDs  Consultants:  PC  Cardiology    Procedures/Studies:  No results found.   Antibiotics:  Currently none (was treated with vanc and flagyl  Code Status: DNR Family Communication: Pt at bedside Disposition Plan: SNF/Hospice  HPI/Subjective: Had some diarrhea overnight, but rested comfortably  per patient. She was alert to voice and said she had no pain and no complaints.   Objective: Filed Vitals:   04/04/14 0909 04/04/14 1705 04/04/14 2107 04/05/14 0645  BP: 146/64  179/65 162/67 154/72  Pulse: 80 70 65 62  Temp: 99.2 F (37.3 C) 98.6 F (37 C) 98.3 F (36.8 C) 98.5 F (36.9 C)  TempSrc: Oral Oral Oral Oral  Resp: 18 18 18 18   Height:   5' (1.524 m)   Weight:   123 lb 7.3 oz (56 kg)   SpO2: 96% 96% 95% 96%    Intake/Output Summary (Last 24 hours) at 04/05/14 0759 Last data filed at 04/04/14 1700  Gross per 24 hour  Intake    480 ml  Output      0 ml  Net    480 ml    Exam:   General:  Pt is alert, answers questions, Marcus in place  Cardiovascular: Irreg, Irreg, no murmur  Respiratory: Clear to auscultation bilaterally, no wheezing  Abdomen: Soft, non tender, +BS  Extremities: No edema, pulses DP and PT palpable bilaterally  Neuro: Grossly nonfocal  Data Reviewed: Basic Metabolic Panel:  Recent Labs Lab 03/30/14 0515 03/31/14 0313 04/01/14 0002 04/02/14 0617 04/03/14 0431  NA 139 132* 131* 132* 136*  K 4.1 3.4* 3.5* 4.9 3.8  CL 105 94* 94* 97 97  CO2 17* 17* 18* 17* 21  GLUCOSE 121* 111* 97 95 92  BUN 31* 29* 32* 30* 30*  CREATININE 1.84* 1.96* 2.06* 1.81* 1.88*  CALCIUM 8.4 8.7 8.4 8.1* 8.2*    CBC:  Recent Labs Lab 03/30/14 0240 03/31/14 0313 04/01/14 0002 04/02/14 0617  WBC 22.0* 16.7* 13.5* 13.5*  NEUTROABS  --  12.5*  --   --   HGB 12.0 12.6 12.4 12.7  HCT 37.3 38.4 38.0 39.0  MCV 88.6 87.7 88.2 89.4  PLT 338 340 321 343   Cardiac Enzymes:  Recent Labs Lab 03/31/14 0313 04/01/14 1116  CKTOTAL 296* 168     Recent Results (from the past 240 hour(s))  CULTURE, BLOOD (ROUTINE X 2)     Status: None   Collection Time    03/27/14  4:45 PM      Result Value Ref Range Status   Specimen Description BLOOD LEFT ANTECUBITAL   Final   Special Requests BOTTLES DRAWN AEROBIC AND ANAEROBIC 6CC EA   Final   Culture  Setup Time     Final   Value: 03/28/2014 02:10     Performed at [facility]   Culture     Final   Value: NO GROWTH 5 DAYS     Performed at [facility]   Report Status  04/03/2014 FINAL   Final  CULTURE, BLOOD (ROUTINE X 2)     Status: None   Collection Time    03/27/14  5:24 PM      Result Value Ref Range Status   Specimen Description BLOOD RIGHT HAND   Final   Special Requests BOTTLES DRAWN AEROBIC ONLY 5CC   Final   Culture  Setup Time     Final   Value: 03/28/2014 02:10     Performed at [facility]   Culture     Final   Value: NO GROWTH 5 DAYS     Performed at [facility]   Report Status 04/03/2014 FINAL   Final  MRSA PCR SCREENING     Status: None   Collection Time  03/27/14  6:08 PM      Result Value Ref Range Status   MRSA by PCR NEGATIVE  NEGATIVE Final   Comment:            The GeneXpert MRSA Assay (FDA     approved for NASAL specimens     only), is one component of a     comprehensive MRSA colonization     surveillance program. It is not     intended to diagnose MRSA     infection nor to guide or     monitor treatment for     MRSA infections.  CLOSTRIDIUM DIFFICILE BY PCR     Status: None   Collection Time    03/28/14  5:17 PM      Result Value Ref Range Status   C difficile by pcr NEGATIVE  NEGATIVE Final     Scheduled Meds: . feeding supplement (RESOURCE BREEZE)  1 Container Oral BID BM  . furosemide  40 mg Oral Daily  . levothyroxine  50 mcg Oral QAC breakfast  . magic mouthwash  5 mL Oral TID  . QUEtiapine  25 mg Oral QHS  . sertraline  25 mg Oral Daily  . sodium chloride  3 mL Intravenous Q12H   Continuous Infusions: . sodium chloride Stopped (03/30/14 0600)     Debe Coder, MD  [facility] Pager [phone]  If 7PM-7AM, please contact night-coverage [email or web address] Password [email or web address] 04/05/2014, 7:59 AM   LOS: 9 days

## 2014-05-20 ENCOUNTER — Ambulatory Visit (INDEPENDENT_AMBULATORY_CARE_PROVIDER_SITE_OTHER): Payer: Medicare Other | Admitting: Cardiovascular Disease

## 2014-05-20 ENCOUNTER — Encounter: Payer: Self-pay | Admitting: Cardiovascular Disease

## 2014-05-20 VITALS — BP 130/84 | HR 64 | Ht 65.0 in

## 2014-05-20 DIAGNOSIS — I214 Non-ST elevation (NSTEMI) myocardial infarction: Secondary | ICD-10-CM

## 2014-05-20 NOTE — Progress Notes (Signed)
History of Present Illness: 80 y.o.female history of CKD, HTN, HLD, and renal mass here today for hospital follow up. She was admitted to [facility] 03/28/14 after a fall at home. She was seen initially by Dr. Wyline Mood and then followed closely by Dr. Jens Som. She was found to be volume overloaded with possible infection and elevated blood pressure. She ruled in for an MI with elevated troponin. Echo 03/28/14 with LVEF=50-55% with akinesis of the apical/distal anterior wall.  No invasive cardiac workup was performed given her advanced age and comorbidities including chronic renal insufficiency. She was also found to have a renal mass. She was diuresed with IV Lasix.  Noted on telemetry to have Mobitz 1 AV block. Palliative care was involved in the hospital. She was made full comfort measures and discharged to a SNF.   She is here today for hospital follow up. She is now comfort care. She is here with a transporter. Daughter is not here and was not aware she had an appointment today. I spoke to her daughter on the phone and she stated that the patient does not desire any interventions or further workup. The patient does not endorse pain or SOB.   Past Medical History  Diagnosis Date  . Hypertension   . Bowel obstruction 1987  . Hypothyroidism   . Interstitial cystitis   . Hypercholesteremia   . Headache(784.0)   . Anxiety   . Depression   . Multiple respiratory allergies   . Restless leg syndrome   . Stroke 06/2002    right parietal nonhemorrhagic acute infarction  . Abdominal mass   . Fibromyalgia   . Parkinson's disease     dx'd 1990's; told she doesn't have it ~ 2000  . Bradycardia, sinus   . CKD (chronic kidney disease): baseline cr 1.3-1.5 12/18/2013    Past Surgical History  Procedure Laterality Date  . Abdominal surgery  1970's    "for blockage"  . Cystoscopy with urethral dilatation  02/2006; 07/2005; 01/2005  . Cholecystectomy    . Tonsillectomy    . Appendectomy    . Abdominal  hysterectomy    . Dilation and curettage of uterus  1940's  . Shoulder surgery  1970's    "for fx"    Current Outpatient Prescriptions  Medication Sig Dispense Refill  . acetaminophen (TYLENOL) 500 MG tablet Take 1,000 mg by mouth at bedtime.      . bisacodyl (DULCOLAX) 10 MG suppository Place 1 suppository (10 mg total) rectally daily as needed for moderate constipation.  12 suppository  0  . ceftaroline 400 mg in sodium chloride 0.9 % 250 mL Inject 400 mg into the vein every 12 (twelve) hours.      . ciprofloxacin (CIPRO) 250 MG/5ML (5%) SUSR Take by mouth 2 (two) times daily.      . diphenhydrAMINE (BENADRYL) 25 mg capsule Take 1 capsule (25 mg total) by mouth every 8 (eight) hours as needed for itching.  30 capsule  0  . diphenoxylate-atropine (LOMOTIL) 2.5-0.025 MG per tablet Take 1 tablet by mouth 4 (four) times daily as needed for diarrhea or loose stools.  30 tablet  0  . doxepin (SINEQUAN) 10 MG capsule Take 10 mg by mouth 3 (three) times daily.      . famotidine (PEPCID) 20 MG tablet Take 20 mg by mouth daily.      . feeding supplement, RESOURCE BREEZE, (RESOURCE BREEZE) LIQD Take 1 Container by mouth 2 (two) times daily between meals.  240 mL  0  . HYDROcodone-acetaminophen (NORCO/VICODIN) 5-325 MG per tablet Take 1-2 tablets by mouth every 4 (four) hours as needed for moderate pain.  30 tablet  0  . lamotrigine (LAMICTAL ODT) 25 MG disintegrating tablet Take 25 mg by mouth daily.      Marland Kitchen lamoTRIgine (LAMICTAL) 25 MG tablet Take 25 mg by mouth 2 (two) times daily.      Marland Kitchen levothyroxine (SYNTHROID, LEVOTHROID) 50 MCG tablet Take 50 mcg by mouth daily.        Marland Kitchen lidocaine (XYLOCAINE) 2 % solution Use as directed 15 mLs in the mouth or throat every 3 (three) hours as needed for mouth pain.  100 mL  0  . loperamide (IMODIUM) 2 MG capsule Take 1 capsule (2 mg total) by mouth as needed for diarrhea or loose stools.  30 capsule  0  . LORazepam (ATIVAN) 1 MG tablet Take 1 tablet (1 mg total)  by mouth 3 (three) times daily.  30 tablet  0  . QUEtiapine (SEROQUEL) 25 MG tablet Take 25 mg by mouth at bedtime.       . saccharomyces boulardii (FLORASTOR) 250 MG capsule Take 250 mg by mouth 2 (two) times daily.      . sertraline (ZOLOFT) 25 MG tablet Take 25 mg by mouth daily.      . Alum & Mag Hydroxide-Simeth (MAGIC MOUTHWASH) SOLN Take 5 mLs by mouth 3 (three) times daily.  15 mL  0   No current facility-administered medications for this visit.    Allergies  Allergen Reactions  . Compazine     hives  . Penicillins     rash  . Sulfa Antibiotics     rash  . Iohexol Rash    Patient states last time she had "CT contrast" she got a rash.    History   Social History  . Marital Status: Widowed    Spouse Name: N/A    Number of Children: N/A  . Years of Education: N/A   Occupational History  . Not on file.   Social History Main Topics  . Smoking status: Never Smoker   . Smokeless tobacco: Never Used  . Alcohol Use: No  . Drug Use: No  . Sexual Activity: No   Other Topics Concern  . Not on file   Social History Narrative  . No narrative on file    No family history on file.  Review of Systems:  As stated in the HPI and otherwise negative.   BP 130/84  Pulse 64  Ht  (1.651 m)  SpO2 93%  Physical Examination: General: Well developed, well nourished, NAD HEENT: OP clear, mucus membranes moist SKIN: warm, dry. No rashes. Neuro: No focal deficits Musculoskeletal: Muscle strength 5/5 all ext Psychiatric: Mood and affect normal Neck: No JVD, no carotid bruits, no thyromegaly, no lymphadenopathy. Lungs:Clear bilaterally, no wheezes, rhonci, crackles Cardiovascular: Regular rate and rhythm. No murmurs, gallops or rubs. Abdomen:Soft. Bowel sounds present. Non-tender.  Extremities: No lower extremity edema. Pulses are 2 + in the bilateral DP/PT.  EKG:  Echo 03/28/14: Left ventricle: The cavity size was normal. Wall thickness was increased in a pattern of  mild LVH. Systolic function was normal. The estimated ejection fraction was in the range of 50% to 55%. There is akinesis of the apical/ distal myocardium. Features are consistent with a pseudonormal left ventricular filling pattern, with concomitant abnormal relaxation and increased filling pressure (grade 2 diastolic dysfunction). - Mitral valve: There was  mild regurgitation. - Left atrium: The atrium was mildly dilated. Impressions: - When compared to prior echocardiogram 12/19/13, apical/ distal wall akinesis is new.  Assessment and Plan:   1. Recent NSTEMI: Unclear etiology. Possible ACS. Echo with apical and anterior wall akinesis. No plans for cardiac workup per family, pt and treating team in hospital given her advanced age. She is now comfort care. I have spoken to her daughter Clayborne Dana today and plans for comfort care only. We will not see her back in our office.

## 2014-05-20 NOTE — Patient Instructions (Signed)
Your physician recommends that you schedule a follow-up appointment  As needed  

## 2014-11-23 DEATH — deceased

## 2016-02-07 IMAGING — CR DG CHEST 1V PORT
1 series · 1 of 1 positions shown · non-contrast
Comparison: 03/27/2014

CLINICAL DATA: Shortness of breath. Abnormal breath sounds.
Systemic inflammatory response syndrome.

EXAM:
PORTABLE CHEST - 1 VIEW

[AP]
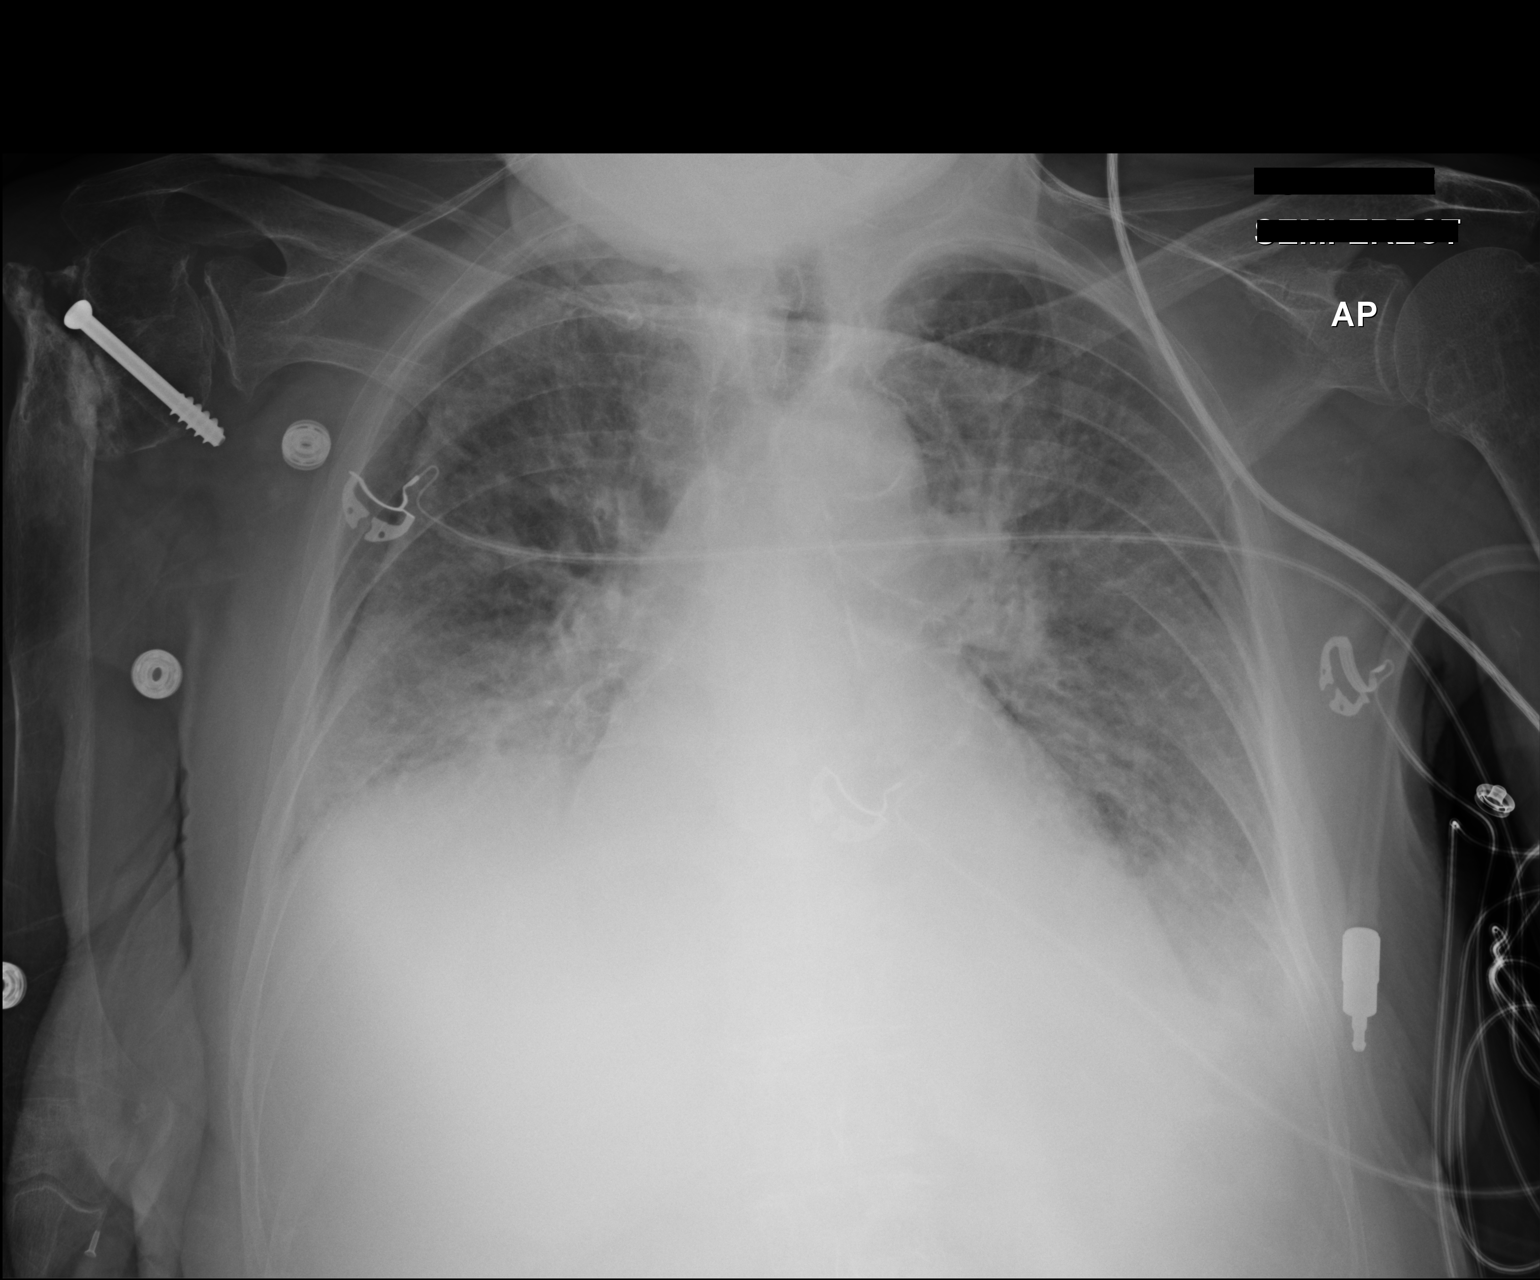

[1 of 1 positions shown; findings below may reference images not displayed]

FINDINGS: New diffuse bilateral airspace disease is seen, consistent with
diffuse pulmonary edema. Elevation of right hemidiaphragm is
unchanged. Heart size is mildly largest stable. Small pleural
effusions cannot be excluded.
IMPRESSION: Increased diffuse pulmonary edema. Stable mild cardiomegaly. Cannot
exclude small bilateral pleural effusions.
